# Patient Record
Sex: Male | Born: 1935
Health system: Southern US, Community
[De-identification: ages and names within clinical notes are randomized; demographics above are authoritative.]

## PROBLEM LIST (undated history)

## (undated) DIAGNOSIS — C801 Malignant (primary) neoplasm, unspecified: Secondary | ICD-10-CM

## (undated) DIAGNOSIS — I10 Essential (primary) hypertension: Secondary | ICD-10-CM

## (undated) DIAGNOSIS — G4730 Sleep apnea, unspecified: Secondary | ICD-10-CM

## (undated) DIAGNOSIS — N400 Enlarged prostate without lower urinary tract symptoms: Secondary | ICD-10-CM

## (undated) HISTORY — DX: Malignant (primary) neoplasm, unspecified: C80.1

---

## 2012-03-07 ENCOUNTER — Emergency Department (HOSPITAL_COMMUNITY)
Admission: EM | Admit: 2012-03-07 | Discharge: 2012-03-07 | Disposition: A | Discharge status: Home / Self Care | Payer: Medicare Other | Attending: Emergency Medicine | Admitting: Emergency Medicine

## 2012-03-07 ENCOUNTER — Encounter (HOSPITAL_COMMUNITY): Payer: Self-pay | Admitting: Emergency Medicine

## 2012-03-07 ENCOUNTER — Emergency Department (HOSPITAL_COMMUNITY): Payer: Medicare Other

## 2012-03-07 DIAGNOSIS — I10 Essential (primary) hypertension: Secondary | ICD-10-CM | POA: Insufficient documentation

## 2012-03-07 DIAGNOSIS — L2989 Other pruritus: Secondary | ICD-10-CM | POA: Insufficient documentation

## 2012-03-07 DIAGNOSIS — R05 Cough: Secondary | ICD-10-CM | POA: Insufficient documentation

## 2012-03-07 DIAGNOSIS — B029 Zoster without complications: Secondary | ICD-10-CM | POA: Insufficient documentation

## 2012-03-07 DIAGNOSIS — J40 Bronchitis, not specified as acute or chronic: Secondary | ICD-10-CM | POA: Insufficient documentation

## 2012-03-07 DIAGNOSIS — J3489 Other specified disorders of nose and nasal sinuses: Secondary | ICD-10-CM | POA: Insufficient documentation

## 2012-03-07 DIAGNOSIS — R059 Cough, unspecified: Secondary | ICD-10-CM | POA: Insufficient documentation

## 2012-03-07 DIAGNOSIS — L298 Other pruritus: Secondary | ICD-10-CM | POA: Insufficient documentation

## 2012-03-07 DIAGNOSIS — R21 Rash and other nonspecific skin eruption: Secondary | ICD-10-CM | POA: Insufficient documentation

## 2012-03-07 DIAGNOSIS — E119 Type 2 diabetes mellitus without complications: Secondary | ICD-10-CM | POA: Insufficient documentation

## 2012-03-07 HISTORY — DX: Essential (primary) hypertension: I10

## 2012-03-07 LAB — GLUCOSE, CAPILLARY: Glucose-Capillary: 84 mg/dL (ref 70–99)

## 2012-03-07 MED ORDER — AZITHROMYCIN 250 MG PO TABS: 250.0000 mg | ORAL_TABLET | Freq: Every day | ORAL | Status: AC

## 2012-03-07 MED ORDER — HYDROCODONE-ACETAMINOPHEN 5-325 MG PO TABS: 1.0000 | ORAL_TABLET | Freq: Four times a day (QID) | ORAL | Status: AC | PRN

## 2012-03-07 MED ORDER — ACYCLOVIR 400 MG PO TABS: 800.0000 mg | ORAL_TABLET | Freq: Every day | ORAL | Status: AC

## 2012-03-07 NOTE — ED Notes (Signed)
States rash come back after 35 years may be shingles on rt side he states

## 2012-03-07 NOTE — ED Provider Notes (Signed)
History     CSN: [id number]  Arrival date & time 03/07/12  0920   First MD Initiated Contact with Patient 03/07/12 [phone]      Chief Complaint  Patient presents with  . Rash    (Consider location/radiation/quality/duration/timing/severity/associated sxs/prior treatment) HPI Comments: Patient reports he has a rash that began 1-2 days ago that looks like his prior shingles he had over 30 years ago.  States he does not hurt yet but itches a little bit.  States he has also had some sinus congestion, rhinorrhea, and cough for the past 3-4 weeks.  Denies fevers, facial pain, SOB.  PCP is the [place].    Patient is a 76 y.o. male presenting with rash. The history is provided by the patient.  Rash     Past Medical History  Diagnosis Date  . Hypertension   . Diabetes mellitus     History reviewed. No pertinent past surgical history.  No family history on file.  History  Substance Use Topics  . Smoking status: Never Smoker   . Smokeless tobacco: Not on file  . Alcohol Use: No      Review of Systems  Constitutional: Negative for fever.  HENT: Positive for rhinorrhea. Negative for sore throat, trouble swallowing and sinus pressure.   Respiratory: Positive for cough. Negative for shortness of breath.   Cardiovascular: Negative for chest pain.  Skin: Positive for rash.    Allergies  Aspirin  Home Medications  No current outpatient prescriptions on file.  BP 131/81  Pulse 73  Temp(Src) 98.9 F (37.2 C) (Oral)  Resp 16  SpO2 99%  Physical Exam  Nursing note and vitals reviewed. Constitutional: He is oriented to person, place, and time. He appears well-developed and well-nourished.  HENT:  Head: Normocephalic and atraumatic.  Nose: Right sinus exhibits no maxillary sinus tenderness and no frontal sinus tenderness. Left sinus exhibits no maxillary sinus tenderness and no frontal sinus tenderness.  Mouth/Throat: Oropharynx is clear and moist. No oropharyngeal exudate.    Neck: Neck supple.  Cardiovascular: Normal rate and regular rhythm.   Pulmonary/Chest: Effort normal and breath sounds normal. No respiratory distress. He has no wheezes. He has no rales.  Musculoskeletal: Normal range of motion.  Neurological: He is alert and oriented to person, place, and time.  Skin: Rash noted. Rash is vesicular.     Psychiatric: He has a normal mood and affect. His behavior is normal. Judgment and thought content normal.    ED Course  Procedures (including critical care time)   Labs Reviewed  GLUCOSE, CAPILLARY   Dg Chest 2 View  03/07/2012  *RADIOLOGY REPORT*  Clinical Data: Cough.  Recent history of shingles.  CHEST - 2 VIEW  Comparison: None.  Findings: Linear subsegmental atelectasis or scarring noted at the right lung base.  Airway thickening may reflect bronchitis or reactive airways disease.  No airspace opacity is identified to suggest bacterial pneumonia pattern.   Cardiac and mediastinal contours appear unremarkable.  No pleural effusion noted.  Thoracic spondylosis is present.  IMPRESSION:  1.  Thoracic spondylosis. 2.  Minimal subsegmental atelectasis or scar at the right lung base. 3.  Mild central airway thickening may reflect bronchitis or reactive airways disease.  Original Report Authenticated By: Dellia Cloud, M.D.   11:35 AM Dr Clarene Duke is aware of the patient.   1. Herpes zoster   2. Bronchitis       MDM  Patient with rash c/w herpes zoster.  The rash began 1-2  days ago and is preceding the pain.  I have d/c home home with acyclovir and pain medication (for anticipated pain), also with z-pak for unresolving nasal congestion and cough - given duration of illness, have given antibiotic for likely bacterial component.  Patient verbalizes understanding and agrees with plan.        Dillard Cannon Vivian, [place] 03/07/12 1557

## 2012-03-07 NOTE — Discharge Instructions (Signed)
Shingles Shingles is caused by the same virus that causes chickenpox (varicella zoster virus or VZV). Shingles often occurs many years or decades after having chickenpox. That is why it is more common in adults older than 50 years. The virus reactivates and breaks out as an infection in a nerve root. SYMPTOMS   The initial feeling (sensations) may be pain. This pain is usually described as:   Burning.   Stabbing.   Throbbing.   Tingling in the nerve root.   A red rash will follow in a couple days. The rash may occur in any area of the body and is usually on one side (unilateral) of the body in a band or belt-like pattern. The rash usually starts out as very small blisters (vesicles). They will dry up after 7 to 10 days. This is not usually a significant problem except for the pain it causes.   Long-lasting (chronic) pain is more likely in an elderly person. It can last months to years. This condition is called postherpetic neuralgia.  Shingles can be an extremely severe infection in someone with AIDS, a weakened immune system, or with forms of leukemia. It can also be severe if you are taking transplant medicines or other medicines that weaken the immune system. TREATMENT  Your caregiver will often treat you with:  Antiviral drugs.   Anti-inflammatory drugs.   Pain medicines.  Bed rest is very important in preventing the pain associated with herpes zoster (postherpetic neuralgia). Application of heat in the form of a hot water bottle or electric heating pad or gentle pressure with the hand is recommended to help with the pain or discomfort. PREVENTION  A varicella zoster vaccine is available to help protect against the virus. The Food and Drug Administration approved the varicella zoster vaccine for individuals 62 years of age and older. HOME CARE INSTRUCTIONS   Cool compresses to the area of rash may be helpful.   Only take over-the-counter or prescription medicines for pain,  discomfort, or fever as directed by your caregiver.   Avoid contact with:   Babies.   Pregnant women.   Children with eczema.   Elderly people with transplants.   People with chronic illnesses, such as leukemia and AIDS.   If the area involved is on your face, you may receive a referral for follow-up to a specialist. It is very important to keep all follow-up appointments. This will help avoid eye complications, chronic pain, or disability.  SEEK IMMEDIATE MEDICAL CARE IF:   You develop any pain (headache) in the area of the face or eye. This must be followed carefully by your caregiver or ophthalmologist. An infection in part of your eye (cornea) can be very serious. It could lead to blindness.   You do not have pain relief from prescribed medicines.   Your redness or swelling spreads.   The area involved becomes very swollen and painful.   You have a fever.   You notice any red or painful lines extending away from the affected area toward your heart (lymphangitis).   Your condition is worsening or has changed.  Document Released: 12/06/2005 Document Revised: 11/25/2011 Document Reviewed: 11/10/2009 [facility] Patient Information 2012 [place], [place].  Bronchitis Bronchitis is the body's way of reacting to injury and/or infection (inflammation) of the bronchi. Bronchi are the air tubes that extend from the windpipe into the lungs. If the inflammation becomes severe, it may cause shortness of breath. CAUSES  Inflammation may be caused by:  A virus.  Germs (bacteria).   Dust.   Allergens.   Pollutants and many other irritants.  The cells lining the bronchial tree are covered with tiny hairs (cilia). These constantly beat upward, away from the lungs, toward the mouth. This keeps the lungs free of pollutants. When these cells become too irritated and are unable to do their job, mucus begins to develop. This causes the characteristic cough of bronchitis. The cough clears the  lungs when the cilia are unable to do their job. Without either of these protective mechanisms, the mucus would settle in the lungs. Then you would develop pneumonia. Smoking is a common cause of bronchitis and can contribute to pneumonia. Stopping this habit is the single most important thing you can do to help yourself. TREATMENT   Your caregiver may prescribe an antibiotic if the cough is caused by bacteria. Also, medicines that open up your airways make it easier to breathe. Your caregiver may also recommend or prescribe an expectorant. It will loosen the mucus to be coughed up. Only take over-the-counter or prescription medicines for pain, discomfort, or fever as directed by your caregiver.   Removing whatever causes the problem (smoking, for example) is critical to preventing the problem from getting worse.   Cough suppressants may be prescribed for relief of cough symptoms.   Inhaled medicines may be prescribed to help with symptoms now and to help prevent problems from returning.   For those with recurrent (chronic) bronchitis, there may be a need for steroid medicines.  SEEK IMMEDIATE MEDICAL CARE IF:   During treatment, you develop more pus-like mucus (purulent sputum).   You have a fever.   Your baby is older than 3 months with a rectal temperature of 102 F (38.9 C) or higher.   Your baby is 52 months old or younger with a rectal temperature of 100.4 F (38 C) or higher.   You become progressively more ill.   You have increased difficulty breathing, wheezing, or shortness of breath.  It is necessary to seek immediate medical care if you are elderly or sick from any other disease. MAKE SURE YOU:   Understand these instructions.   Will watch your condition.   Will get help right away if you are not doing well or get worse.  Document Released: 12/06/2005 Document Revised: 11/25/2011 Document Reviewed: 10/15/2008 [facility] Patient Information 2012 [place], [place].  If you  have no primary doctor, here are some resources that may be helpful:  Medicaid-accepting [facility] Providers:   - [facility]- [address]      [phone]      Mon-Fri 9am-7pm, Sat 9am-1pm   - [facility]- [address] [place], [place]      [phone]   - [facility]- [address], [address] [place]      [phone]   [facility]- [address]      [phone]   - Renaye Rakers- [address] [place], [address]      [phone]      Only accepts [place] patients       after they have her name applied to their card   Self Pay (no insurance) in [place]:   - Sickle Cell Patients: Dr Willey Blade, [facility]      [address] [place]      [phone]   - [facility] [phone]   - Physician Referral  Service- [phone]   - [facility]- [address] [place]      [phone]   Patrcia Dolly [facility]- [address]   - [facility]- see information above      (Speak to [employer] if you do not have insurance)   - [facility]- [address] [place]      [phone]   - [facility] [place]- [address]      [phone]   - [facility]- [address]      [phone]   - Dr Julio Sicks-  [address], [place]      [phone]   - [facility]- [address]      [phone]   - [facility]- [address]      [phone]      Also [address]      [phone]   - [facility]- [address]      [phone]      1st and 3rd Saturday every month, 10am-1pm Other agencies that provide inexpensive medical care:    [facility]  [phone]    [facility]  [phone]    [facility]  [phone]    [facility]  [phone]    [facility]  [phone]  General Information: Finding a doctor when you do not  have health insurance can be tricky. Although you are not limited by an insurance plan, you are of course limited by her finances and how much but he can pay out of pocket.  What are your options if you don't have health insurance?   1) Find a Librarian, academic and Pay Out of Pocket Although you won't have to find out who is covered by your insurance plan, it is a good idea to ask around and get recommendations. You will then need to call the office and see if the doctor you have chosen will accept you as a new patient and what types of options they offer for patients who are self-pay. Some doctors offer discounts or will set up payment plans for their patients who do not have insurance, but you will need to ask so you aren't surprised when you get to your appointment.  2) Contact Your Local Health Department Not all health departments have doctors that can see patients for sick visits, but many do, so it is worth a call to see if yours does. If you don't know where your local health department is, you can check in your phone book. The CDC also has a tool to help you locate your state's health department, and many state websites also have listings of all of their local health departments.  3) Find a Walk-in Clinic If your illness is not likely to be very severe or complicated, you may want to try a walk in clinic. These are popping up all over the country in pharmacies, drugstores, and shopping centers. They're usually staffed by nurse practitioners or physician assistants that have been trained to treat common illnesses and complaints. They're usually fairly quick and inexpensive. However, if you have serious medical issues or chronic medical problems, these are probably not your best option   RESOURCE GUIDE  Dental Problems  Patients with Medicaid: [facility]  [facility] [address]                                           [phone] [address] [employer] Phone:   [phone]                                                  Phone:  [phone]  If unable to pay or uninsured, contact:  [facility] or [facility]. to become qualified for the adult dental clinic.  Chronic Pain Problems Contact [facility]  [phone] Patients need to be referred by their primary care doctor.  Insufficient Money for Medicine Contact United Way:  call "211" or [facility] [phone].  No Primary Care Doctor Call [facility]  [phone] Other agencies that provide inexpensive medical care    [facility]  [phone]    [facility]  [phone]    [facility]  [phone]    [facility]  [phone]    [facility]  [phone]    [facility]  [phone]  Psychological Services [facility]  [phone] [facility] Services  [phone] [facility]   [phone] (emergency services [phone])  Substance Abuse Resources [facility]  [phone] [facility] [phone] [facility] [place] [phone] Floydene Flock [phone] Residential & Outpatient Substance Abuse Program  [phone]  Abuse/Neglect [facility] Child Abuse Hotline [phone] [facility] Child Abuse Hotline 419-845-1886 (After Hours)  Emergency Shelter [facility] [phone]  Maternity Homes [facility] at the [place] [facility] [place] [phone] [facility] [phone]  MRSA Hotline #:   279-333-8633    [facility] Resources  [facility] [place]     United Way                          [facility] Dept. [address] [place]                       [address]      [address]  Blondell Reveal Phone:  [phone]                                   Phone:  [phone]                 Phone:   [phone]  [facility] Mental Health Phone:  [phone]  [facility] Child Abuse Hotline [phone])[phone] [phone] (After Hours)

## 2012-03-07 NOTE — ED Notes (Signed)
Pt. Has blister like adhesions on his rt. Upper chest area under his rt. Nipple, which radiates around his rt. Upper back .   They are red, inflammed and a few have dark centers.  He reports that they are painful to touch.

## 2012-03-09 NOTE — ED Provider Notes (Signed)
Medical screening examination/treatment/procedure(s) were performed by non-physician practitioner and as supervising physician I was immediately available for consultation/collaboration.   Laray Anger, DO 03/09/12 1246

## 2012-06-30 ENCOUNTER — Emergency Department (HOSPITAL_COMMUNITY)
Admission: EM | Admit: 2012-06-30 | Discharge: 2012-06-30 | Disposition: A | Discharge status: Home / Self Care | Payer: Medicare Other | Attending: Emergency Medicine | Admitting: Emergency Medicine

## 2012-06-30 DIAGNOSIS — E119 Type 2 diabetes mellitus without complications: Secondary | ICD-10-CM | POA: Insufficient documentation

## 2012-06-30 DIAGNOSIS — M436 Torticollis: Secondary | ICD-10-CM | POA: Insufficient documentation

## 2012-06-30 DIAGNOSIS — I10 Essential (primary) hypertension: Secondary | ICD-10-CM | POA: Insufficient documentation

## 2012-06-30 DIAGNOSIS — L299 Pruritus, unspecified: Secondary | ICD-10-CM | POA: Insufficient documentation

## 2012-06-30 MED ORDER — DIPHENHYDRAMINE HCL 25 MG PO CAPS: 25.0000 mg | ORAL_CAPSULE | Freq: Four times a day (QID) | ORAL | Status: DC | PRN

## 2012-06-30 MED ORDER — TRAMADOL HCL 50 MG PO TABS: 50.0000 mg | ORAL_TABLET | Freq: Four times a day (QID) | ORAL | Status: AC | PRN

## 2012-06-30 MED ORDER — CYCLOBENZAPRINE HCL 10 MG PO TABS: 10.0000 mg | ORAL_TABLET | Freq: Two times a day (BID) | ORAL | Status: AC | PRN

## 2012-06-30 MED ORDER — DIPHENHYDRAMINE HCL 25 MG PO CAPS: 25.0000 mg | ORAL_CAPSULE | Freq: Four times a day (QID) | ORAL | Status: AC | PRN

## 2012-06-30 NOTE — ED Provider Notes (Signed)
History     CSN: [id number]  Arrival date & time 06/30/12  Anthony Sheppard   First MD Initiated Contact with Patient 06/30/12 1946      Chief Complaint  Patient presents with  . Torticollis  . URI  . Herpes Zoster  . Rash    (Consider location/radiation/quality/duration/timing/severity/associated sxs/prior treatment) HPI  76 year old male with history of herpes zoster, HTN, and diabtes presents complaining of neck discomfort. Patient reports for the past 3 days he has had pain and stiffness to his neck. States he woke up one morning and noticed pain to his neck. Describe pain as a sharp sensation that is constant and worsening with neck movement. Pain is nonradiating. He reports no fever, vision changes, nausea, vomiting, rash, numbness or weakness. He denies chest pain or shortness of breath. He denies any recent trauma. He has tried ibuprofen with some relief.  Patient also has a history of shingle rash to the right side of his chest for the past several months. States that the shingle rash has resolved however he continues experiencing itchiness. This is been ongoing for the past several weeks. He denies discomforts in ear, nose, mouth.  Past Medical History  Diagnosis Date  . Hypertension   . Diabetes mellitus     No past surgical history on file.  No family history on file.  History  Substance Use Topics  . Smoking status: Never Smoker   . Smokeless tobacco: Not on file  . Alcohol Use: No      Review of Systems  All other systems reviewed and are negative.    Allergies  Aspirin  Home Medications   Current Outpatient Rx  Name Route Sig Dispense Refill  . IBUPROFEN 200 MG PO TABS Oral Take 200 mg by mouth every 6 (six) hours as needed. Pain      BP 115/64  Pulse 77  Temp 98.6 F (37 C) (Oral)  Resp 18  SpO2 95%  Physical Exam  Nursing note and vitals reviewed. Constitutional: He is oriented to person, place, and time. He appears well-developed and  well-nourished. No distress.  HENT:  Head: Normocephalic and atraumatic.  Right Ear: External ear normal.  Left Ear: External ear normal.  Nose: Nose normal.  Mouth/Throat: Oropharynx is clear and moist. No oropharyngeal exudate.       No meningismal sign  Eyes: Conjunctivae and EOM are normal. Pupils are equal, round, and reactive to light.  Neck: Neck supple.       Tenderness to trapezius muscle to neck bilaterally on palpation without overlying skin changes, or swelling noted. No midline spine tenderness, no step-off. Sensation is intact throughout. Decreased range of motion to lateral rotation and extension. No tenderness with flexion  Negative Brudzinski signs, negative Kernig sign  Cardiovascular: Normal rate and regular rhythm.   Pulmonary/Chest: Effort normal and breath sounds normal. He has no wheezes. He exhibits no tenderness.       Hyperpigmented lesion following dermatomal pattern on the right side of chest below breast line.  No vesicles, or new rash.  Consistent with old shingle rash.     Abdominal: Soft. There is no tenderness.  Musculoskeletal: Normal range of motion. He exhibits no edema and no tenderness.  Lymphadenopathy:    He has no cervical adenopathy.  Neurological: He is alert and oriented to person, place, and time.       No focal neuro deficit  Skin: Skin is warm and dry.    ED Course  Procedures (including critical  care time)  Labs Reviewed - No data to display No results found.   No diagnosis found.  1. Neck stiffness 2. torticollis  MDM  Neck stiffness for 3 days.  Tenderness to trapezius muscle and to paracervical region.  No spine midline tenderness.  No meningismal sign, no focal neuro deficits. Will treat for torticollis with ice pack, muscle relaxant and pain medication.  Strict return precaution discussed. Benadryl for itch.  DIscussed with my attending.          Fayrene Helper, PA-C 06/30/12 2028

## 2012-06-30 NOTE — ED Notes (Addendum)
Has had shingles, but "all are healed" but c/o itching at site. Stiff neck started 2 days ago, cold sx.   States does not take medicine for diabetes now,

## 2012-06-30 NOTE — ED Notes (Signed)
Pt reports a stiff neck, recent cold symptoms and a rash which he reports is herpes zoster.

## 2012-07-01 NOTE — ED Provider Notes (Signed)
Medical screening examination/treatment/procedure(s) were conducted as a shared visit with non-physician practitioner(s) and myself.  I personally evaluated the patient during the encounter On my exam this elderly male was in no distress.  The patient was hesitant to move his neck laterally, but with no overt signs of infection.  Gerhard Munch, MD 07/01/12 2102

## 2012-12-14 ENCOUNTER — Emergency Department (HOSPITAL_COMMUNITY): Payer: Medicare Other

## 2012-12-14 ENCOUNTER — Emergency Department (HOSPITAL_COMMUNITY)
Admission: EM | Admit: 2012-12-14 | Discharge: 2012-12-14 | Disposition: A | Discharge status: Home / Self Care | Payer: Medicare Other | Attending: Emergency Medicine | Admitting: Emergency Medicine

## 2012-12-14 ENCOUNTER — Encounter (HOSPITAL_COMMUNITY): Payer: Self-pay | Admitting: *Deleted

## 2012-12-14 DIAGNOSIS — R0981 Nasal congestion: Secondary | ICD-10-CM

## 2012-12-14 DIAGNOSIS — I10 Essential (primary) hypertension: Secondary | ICD-10-CM | POA: Insufficient documentation

## 2012-12-14 DIAGNOSIS — J3489 Other specified disorders of nose and nasal sinuses: Secondary | ICD-10-CM | POA: Insufficient documentation

## 2012-12-14 DIAGNOSIS — R059 Cough, unspecified: Secondary | ICD-10-CM | POA: Insufficient documentation

## 2012-12-14 DIAGNOSIS — R05 Cough: Secondary | ICD-10-CM | POA: Insufficient documentation

## 2012-12-14 DIAGNOSIS — J069 Acute upper respiratory infection, unspecified: Secondary | ICD-10-CM | POA: Insufficient documentation

## 2012-12-14 DIAGNOSIS — E119 Type 2 diabetes mellitus without complications: Secondary | ICD-10-CM | POA: Insufficient documentation

## 2012-12-14 LAB — GLUCOSE, CAPILLARY

## 2012-12-14 MED ORDER — FLUTICASONE PROPIONATE 50 MCG/ACT NA SUSP: 2.0000 | Freq: Every day | NASAL | Status: DC

## 2012-12-14 MED ORDER — CETIRIZINE-PSEUDOEPHEDRINE ER 5-120 MG PO TB12: 1.0000 | ORAL_TABLET | Freq: Every day | ORAL | Status: DC

## 2012-12-14 MED ORDER — BENZONATATE 100 MG PO CAPS: 100.0000 mg | ORAL_CAPSULE | Freq: Three times a day (TID) | ORAL | Status: DC

## 2012-12-14 NOTE — ED Notes (Signed)
Pt c/o cold/cough/sinus congestion x 2 wks

## 2012-12-14 NOTE — ED Provider Notes (Signed)
History     CSN: [id number]  Arrival date & time 12/14/12  1842   First MD Initiated Contact with Patient 12/14/12 2130      No chief complaint on file.   (Consider location/radiation/quality/duration/timing/severity/associated sxs/prior treatment) HPI Anthony Sheppard is a 76 y.o. male who presents with complaint of cough and nasal congestion. States symptoms for 1-2 weeks, not improving. Taking some "over the counter meds" with some relief. States coughing up "yellow stuff." Denies fever, chills, neck pain or stiffness, sore throat, chest pain, shortness of breath.   Past Medical History  Diagnosis Date  . Hypertension   . Diabetes mellitus     History reviewed. No pertinent past surgical history.  No family history on file.  History  Substance Use Topics  . Smoking status: Never Smoker   . Smokeless tobacco: Not on file  . Alcohol Use: No      Review of Systems  Constitutional: Negative for fever and chills.  HENT: Positive for congestion. Negative for ear pain, sore throat, neck pain and neck stiffness.   Respiratory: Positive for cough. Negative for chest tightness, shortness of breath and wheezing.   Cardiovascular: Negative.   Gastrointestinal: Negative.   Genitourinary: Negative for dysuria, urgency, frequency and flank pain.  Musculoskeletal: Negative for myalgias and arthralgias.  Skin: Negative for rash.  Neurological: Negative for dizziness, weakness and headaches.    Allergies  Aspirin  Home Medications  No current outpatient prescriptions on file.  BP 123/71  Pulse 70  Temp 98.2 F (36.8 C)  Resp 20  SpO2 98%  Physical Exam  Nursing note and vitals reviewed. Constitutional: He is oriented to person, place, and time. He appears well-developed and well-nourished. No distress.  HENT:  Head: Normocephalic.  Right Ear: Tympanic membrane, external ear and ear canal normal.  Left Ear: Tympanic membrane, external ear and ear canal normal.    Nose: Rhinorrhea present. Right sinus exhibits no maxillary sinus tenderness and no frontal sinus tenderness. Left sinus exhibits no maxillary sinus tenderness and no frontal sinus tenderness.  Mouth/Throat: Mucous membranes are normal.  Eyes: Conjunctivae normal are normal.  Neck: Neck supple.  Cardiovascular: Normal rate, regular rhythm and normal heart sounds.   Pulmonary/Chest: Effort normal and breath sounds normal. No respiratory distress. He has no wheezes. He has no rales.  Musculoskeletal: He exhibits no edema.  Neurological: He is alert and oriented to person, place, and time.  Skin: Skin is warm and dry.    ED Course  Procedures (including critical care time)  Labs Reviewed - No data to display Dg Chest 2 View  12/14/2012  *RADIOLOGY REPORT*  Clinical Data: Cough  CHEST - 2 VIEW  Comparison: 03/07/2012  Findings: Degenerative changes in the mid thoracic spine. Lungs clear.  Heart size and pulmonary vascularity normal.  No effusion.  IMPRESSION: No acute disease   Original Report Authenticated By: D. Andria Rhein, MD      1. Cough   2. Nose congestion   3. Viral URI with cough       MDM  Pt with URI symptoms for two weeks. His exam is unremarkable other than mild nasal congestion. VS all within normal. Lungs are clear. CXR negative. He is afebrile. Non toxic. Discussed with Dr. Fonnie Jarvis who saw pt as well. Suspect a viral URI. Will treat symptomatically. I do not see any signs of bacterial infection and do not think any further testing or treatment with antibiotics is necessary  Lottie Mussel, PA 12/15/12 [phone]

## 2012-12-14 NOTE — ED Provider Notes (Signed)
Medical screening examination/treatment/procedure(s) were conducted as a shared visit with non-physician practitioner(s) and myself.  I personally evaluated the patient during the encounter.  Patient appears nontoxic with normal speech, lungs clear to auscultation.  Hurman Horn, MD 12/15/12 [phone]

## 2012-12-14 NOTE — ED Notes (Signed)
Pt ambulatory to exam room with steady gait. No acute distress.  

## 2012-12-15 NOTE — ED Provider Notes (Signed)
Medical screening examination/treatment/procedure(s) were conducted as a shared visit with non-physician practitioner(s) and myself.  I personally evaluated the patient during the encounter  Hurman Horn, MD 12/15/12 [phone]

## 2014-06-22 ENCOUNTER — Emergency Department (HOSPITAL_COMMUNITY)
Admission: EM | Admit: 2014-06-22 | Discharge: 2014-06-22 | Disposition: A | Discharge status: Home / Self Care | Payer: Medicare Other | Attending: Emergency Medicine | Admitting: Emergency Medicine

## 2014-06-22 ENCOUNTER — Emergency Department (HOSPITAL_COMMUNITY): Payer: Medicare Other

## 2014-06-22 ENCOUNTER — Encounter (HOSPITAL_COMMUNITY): Payer: Self-pay | Admitting: Emergency Medicine

## 2014-06-22 DIAGNOSIS — E119 Type 2 diabetes mellitus without complications: Secondary | ICD-10-CM | POA: Insufficient documentation

## 2014-06-22 DIAGNOSIS — M545 Low back pain, unspecified: Secondary | ICD-10-CM

## 2014-06-22 DIAGNOSIS — Z791 Long term (current) use of non-steroidal anti-inflammatories (NSAID): Secondary | ICD-10-CM | POA: Insufficient documentation

## 2014-06-22 DIAGNOSIS — Z79899 Other long term (current) drug therapy: Secondary | ICD-10-CM | POA: Insufficient documentation

## 2014-06-22 DIAGNOSIS — I10 Essential (primary) hypertension: Secondary | ICD-10-CM | POA: Insufficient documentation

## 2014-06-22 DIAGNOSIS — IMO0002 Reserved for concepts with insufficient information to code with codable children: Secondary | ICD-10-CM | POA: Insufficient documentation

## 2014-06-22 LAB — URINALYSIS, ROUTINE W REFLEX MICROSCOPIC
Bilirubin Urine: NEGATIVE
GLUCOSE, UA: NEGATIVE mg/dL
HGB URINE DIPSTICK: NEGATIVE
Ketones, ur: NEGATIVE mg/dL
Leukocytes, UA: NEGATIVE
Nitrite: NEGATIVE
PROTEIN: NEGATIVE mg/dL
Specific Gravity, Urine: 1.029 (ref 1.005–1.030)
Urobilinogen, UA: 1 mg/dL (ref 0.0–1.0)
pH: 6 (ref 5.0–8.0)

## 2014-06-22 LAB — CBG MONITORING, ED: GLUCOSE-CAPILLARY: 90 mg/dL (ref 70–99)

## 2014-06-22 MED ORDER — OXYCODONE-ACETAMINOPHEN 5-325 MG PO TABS
1.0000 | ORAL_TABLET | Freq: Once | ORAL | Status: DC
Start: 2014-06-22 — End: 2014-06-22
  Filled 2014-06-22: qty 1

## 2014-06-22 MED ORDER — NAPROXEN 250 MG PO TABS
500.0000 mg | ORAL_TABLET | Freq: Once | ORAL | Status: AC
  Administered 2014-06-22: 500 mg via ORAL
  Filled 2014-06-22: qty 2

## 2014-06-22 MED ORDER — NAPROXEN 500 MG PO TABS: 500.0000 mg | ORAL_TABLET | Freq: Two times a day (BID) | ORAL | Status: DC | PRN

## 2014-06-22 NOTE — ED Notes (Signed)
Patient presents with c/o right hip pain that has gotten worse over the past 3-4 weeks. States it is causing his back and left hip to hurt.

## 2014-06-22 NOTE — Discharge Instructions (Signed)
Back Pain:  Your back pain should be treated with medicines such as naproxen and this back pain should get better over the next 2 weeks.  However if you develop severe or worsening pain, low back pain with fever, numbness, weakness or inability to walk or urinate, you should return to the ER immediately.  Please follow up with your doctor this week for a recheck if still having symptoms.  Low back pain is discomfort in the lower back that may be due to injuries to muscles and ligaments around the spine.  Occasionally, it may be caused by a a problem to a part of the spine called a disc.  The pain may last several days or a week;  However, most patients get completely well in 4 weeks.  Self - care:  The application of heat can help soothe the pain.  Maintaining your daily activities, including walking, is encourged, as it will help you get better faster than just staying in bed. Perform gentle stretching as discussed. Drink plenty of fluids.  SEEK IMMEDIATE MEDICAL ATTENTION IF: New numbness, tingling, weakness, or problem with the use of your arms or legs.  Severe back pain not relieved with medications.  Difficulty with or loss of control of your bowel or bladder control.  Increasing pain in any areas of the body (such as chest or abdominal pain).  Shortness of breath, dizziness or fainting.  Nausea (feeling sick to your stomach), vomiting, fever, or sweats.  You will need to follow up with  The orthopedic provider in 1-2 weeks for reassessment.  Back Pain, Adult Back pain is very common. The pain often gets better over time. The cause of back pain is usually not dangerous. Most people can learn to manage their back pain on their own.  HOME CARE   Stay active. Start with short walks on flat ground if you can. Try to walk farther each day.  Do not sit, drive, or stand in one place for more than 30 minutes. Do not stay in bed.  Do not avoid exercise or work. Activity can help your back heal  faster.  Be careful when you bend or lift an object. Bend at your knees, keep the object close to you, and do not twist.  Sleep on a firm mattress. Lie on your side, and bend your knees. If you lie on your back, put a pillow under your knees.  Only take medicines as told by your doctor.  Put ice on the injured area.  Put ice in a plastic bag.  Place a towel between your skin and the bag.  Leave the ice on for 15-20 minutes, 03-04 times a day for the first 2 to 3 days. After that, you can switch between ice and heat packs.  Ask your doctor about back exercises or massage.  Avoid feeling anxious or stressed. Find good ways to deal with stress, such as exercise. GET HELP RIGHT AWAY IF:   Your pain does not go away with rest or medicine.  Your pain does not go away in 1 week.  You have new problems.  You do not feel well.  The pain spreads into your legs.  You cannot control when you poop (bowel movement) or pee (urinate).  Your arms or legs feel weak or lose feeling (numbness).  You feel sick to your stomach (nauseous) or throw up (vomit).  You have belly (abdominal) pain.  You feel like you may pass out (faint). MAKE SURE YOU:  Understand these instructions.  Will watch your condition.  Will get help right away if you are not doing well or get worse. Document Released: 05/24/2008 Document Revised: 02/28/2012 Document Reviewed: 04/26/2011 [facility] Patient Information 2015 [place], [place]. This information is not intended to replace advice given to you by your health care provider. Make sure you discuss any questions you have with your health care provider.

## 2014-06-22 NOTE — ED Provider Notes (Signed)
CSN: [id number]     Arrival date & time 06/22/14  2000 History  This chart was scribed for non-physician provider Zacarias Pontes, PA-C, working with Neta Ehlers, MD by Irene Pap, ED Scribe. This patient was seen in room [facility] and patient care was started at 9:06 PM.     Chief Complaint  Patient presents with  . Hip Pain  . Back Pain   Patient is a 78 y.o. male presenting with hip pain and back pain. The history is provided by the patient. No language interpreter was used.  Hip Pain This is a new problem. The current episode started more than 1 week ago. The problem occurs constantly. The problem has not changed since onset.Pertinent negatives include no chest pain, no abdominal pain, no headaches and no shortness of breath. The symptoms are aggravated by walking, standing and bending. He has tried NSAIDs for the symptoms. The treatment provided significant relief.  Back Pain Location:  Lumbar spine Quality:  Stabbing Associated symptoms: no abdominal pain, no chest pain, no dysuria, no fever, no headaches, no numbness and no weakness   Hip Pain This is a new problem. The current episode started more than 1 week ago. The problem occurs constantly. The problem has not changed since onset.Associated symptoms include arthralgias (b/l hips, low back). Pertinent negatives include no abdominal pain, change in bowel habit, chest pain, chills, fever, headaches, joint swelling, myalgias, nausea, neck pain, numbness, rash, urinary symptoms, visual change, vomiting or weakness. The symptoms are aggravated by walking, standing and bending. He has tried NSAIDs for the symptoms. The treatment provided significant relief.  HPI Comments: Anthony Sheppard is a 78 y.o. male with a history of DM and HTN who presents to the Emergency Department complaining of  Sharp back and hip pain onset 4 weeks ago. He reports that his lumbar region was hurting at first, then spread to hips last night. He  states that it hurts to get out of his car, walk, and to sleep. He reports that he took Aleve with some relief. He denies fevers/chills, dysuria, hematuria, abd pain, N/V/D, neck pain, CP/SOB, numbness, paresthesias, weakness, bowel/bladder incontinence, blurred vision, headaches, flank pain, penile discharge, hematochezia or melena.  He denies falling, any other injuries to the area and history of similar symptoms. He reports a history of issues with urination related to his prostate starting 1 year ago, for which he has been evaluated for. He reports that he is currently not taking medications for his DM.  Past Medical History  Diagnosis Date  . Hypertension   . Diabetes mellitus    History reviewed. No pertinent past surgical history. History reviewed. No pertinent family history. History  Substance Use Topics  . Smoking status: Never Smoker   . Smokeless tobacco: Never Used  . Alcohol Use: No    Review of Systems  Constitutional: Negative for fever and chills.  Eyes: Negative for pain, redness, itching and visual disturbance.  Respiratory: Negative for chest tightness and shortness of breath.   Cardiovascular: Negative for chest pain.  Gastrointestinal: Negative for nausea, vomiting, abdominal pain, diarrhea, blood in stool and change in bowel habit.  Genitourinary: Negative for dysuria, urgency, frequency, hematuria, flank pain, discharge, penile swelling, scrotal swelling, difficulty urinating, penile pain and testicular pain.  Musculoskeletal: Positive for arthralgias (b/l hips, low back) and back pain. Negative for joint swelling, myalgias, neck pain and neck stiffness.  Skin: Negative for rash.  Neurological: Negative for dizziness, syncope, weakness, numbness and headaches.  Allergies  Aspirin  Home Medications   Prior to Admission medications   Medication Sig Start Date End Date Taking? Authorizing Provider  benzonatate (TESSALON) 100 MG capsule Take 1 capsule (100 mg  total) by mouth every 8 (eight) hours. 12/14/12   Tatyana A Kirichenko, PA-C  cetirizine-pseudoephedrine (ZYRTEC-D) 5-120 MG per tablet Take 1 tablet by mouth daily. 12/14/12   Tatyana A Kirichenko, PA-C  fluticasone (FLONASE) 50 MCG/ACT nasal spray Place 2 sprays into the nose daily. 12/14/12   Tatyana A Kirichenko, PA-C  naproxen (NAPROSYN) 500 MG tablet Take 1 tablet (500 mg total) by mouth 2 (two) times daily as needed for moderate pain. 06/22/14   Cleo Villamizar Strupp Camprubi-Soms, PA-C   BP 102/56  Pulse 70  Temp(Src) 97.6 F (36.4 C) (Oral)  Resp 18  Ht 5\' 1"  (1.549 m)  Wt 140 lb (63.504 kg)  BMI 26.47 kg/m2  SpO2 98% Physical Exam  Nursing note and vitals reviewed. Constitutional: He is oriented to person, place, and time. Vital signs are normal. He appears well-developed and well-nourished. No distress.  HENT:  Head: Normocephalic and atraumatic.  Mouth/Throat: Mucous membranes are normal.  Eyes: Conjunctivae and EOM are normal.  Neck: Normal range of motion. Neck supple. No spinous process tenderness and no muscular tenderness present. Normal range of motion present.  Cspine nonTTP, FROM intact, no meningeal signs  Cardiovascular: Normal rate, intact distal pulses and normal pulses.   Pulmonary/Chest: Effort normal.  Abdominal: Soft. Normal appearance and bowel sounds are normal. He exhibits no distension. There is no tenderness. There is no rigidity, no rebound, no guarding and no CVA tenderness.  Musculoskeletal:       Right hip: He exhibits decreased range of motion (secondary to pain). He exhibits normal strength, no tenderness, no bony tenderness, no swelling, no crepitus and no deformity.       Left hip: He exhibits decreased range of motion (secondary to pain). He exhibits normal strength, no tenderness, no bony tenderness, no swelling, no crepitus and no deformity.       Cervical back: Normal.       Thoracic back: Normal.       Lumbar back: He exhibits decreased range of  motion and pain. He exhibits no tenderness, no bony tenderness, no swelling, no edema and no deformity.  C/T spine with FROM, non-TTP, no deformity or crepitus.  Lspine ROM decreased secondary to pain, , non-TTP along paraspinous muscles and spinous processes. No crepitus or bony deformity, no step offs. Minimal SI joint TTP.  B/L Hips non-TTP, ROM limited due to pain, no deformity or swelling, no erythema.  Strength 5/5 in all extremities. gait slightly antalgic  Neurological: He is alert and oriented to person, place, and time. He has normal strength and normal reflexes. No sensory deficit.  Negative SLR, strength 5/5 in all extremities, DTRs equal and symmetric, sensation grossly intact in all extremities.  Skin: Skin is warm, dry and intact. No rash noted. No erythema.  Psychiatric: He has a normal mood and affect. His behavior is normal.    ED Course  Procedures (including critical care time) DIAGNOSTIC STUDIES: Oxygen Saturation is 98% on room air, normal by my interpretation.    COORDINATION OF CARE: 9:16 PM-Discussed treatment plan which includes x-ray and labs with pt at bedside and pt agreed to plan.   Results for orders placed during the hospital encounter of 06/22/14  URINALYSIS, ROUTINE W REFLEX MICROSCOPIC      Result Value Ref Range  Color, Urine YELLOW  YELLOW   APPearance CLEAR  CLEAR   Specific Gravity, Urine 1.029  1.005 - 1.030   pH 6.0  5.0 - 8.0   Glucose, UA NEGATIVE  NEGATIVE mg/dL   Hgb urine dipstick NEGATIVE  NEGATIVE   Bilirubin Urine NEGATIVE  NEGATIVE   Ketones, ur NEGATIVE  NEGATIVE mg/dL   Protein, ur NEGATIVE  NEGATIVE mg/dL   Urobilinogen, UA 1.0  0.0 - 1.0 mg/dL   Nitrite NEGATIVE  NEGATIVE   Leukocytes, UA NEGATIVE  NEGATIVE  CBG MONITORING, ED      Result Value Ref Range   Glucose-Capillary 90  70 - 99 mg/dL   Comment 1 Notify RN     Dg Lumbar Spine Complete  06/22/2014   CLINICAL DATA:  Chronic low back pain.  EXAM: LUMBAR SPINE -  COMPLETE 4+ VIEW  COMPARISON:  None.  FINDINGS: Normal alignment. Transitional anatomy at the lumbosacral junction. Diffuse osteopenia. No fracture. SI joints are symmetric and unremarkable. Disc spaces are maintained. Mild degenerative facet disease in the lower lumbar spine.  IMPRESSION: Osteopenia. Degenerative facet disease in the lower lumbar spine. No acute findings.   Electronically Signed   By: Rolm Baptise M.D.   On: 06/22/2014 22:15   Dg Hip Bilateral W/pelvis  06/22/2014   CLINICAL DATA:  Chronic low back pain, bilateral hip pain, right greater than left.  EXAM: BILATERAL HIP WITH PELVIS - 4+ VIEW  COMPARISON:  None.  FINDINGS: Mild symmetric degenerative changes in the hips bilaterally. SI joints are symmetric and unremarkable. Mild osteopenia. No acute bony abnormality. Specifically, no fracture, subluxation, or dislocation. Soft tissues are intact.  IMPRESSION: No acute bony abnormality.   Electronically Signed   By: Rolm Baptise M.D.   On: 06/22/2014 22:16      MDM   Final diagnoses:  Bilateral low back pain without sciatica   Ali Mohl is a 78 y.o. male with a PMHx of HTN and DM (diet controlled), presenting with 1 month of back pain. No urinary symptoms. Gait antalgic with minimal SI joint TTP. Xrays showing degenerative changes. Doubt ankylosing spondylitis, doubt cauda equina syndrome. U/A neg. I believe this is arthritic in nature, doubt radiculopathy or disc pathology, doubt aortic aneurysm. This could be lumbar stenosis given degenerative facet disease of lumbar spine seen on xray. Since aleve helps and pt is elderly, will not give muscle relaxants or narcotics. Pt agrees with naproxen. No red flag s/s of low back pain. No s/s of central cord compression or cauda equina. Lower extremities are neurovascularly intact and patient is ambulating without assistance.Patient was counseled on back pain precautions and told to do activity as tolerated but do not lift, push, or pull  heavy objects more than 10 pounds for the next week. Patient counseled to use heat on back for no longer than 15 minutes every hour. Patient urged to follow-up with PCP if pain does not improve with treatment and rest or if pain becomes recurrent. Urged to return with worsening severe pain, loss of bowel or bladder control, trouble walking.   The patient verbalizes understanding and agrees with the plan. Stable at time of d/c.  I personally performed the services described in this documentation, which was scribed in my presence. The recorded information has been reviewed and is accurate.  BP 110/70  Pulse 64  Temp(Src) 97.7 F (36.5 C) (Oral)  Resp 18  Ht 5\' 1"  (1.549 m)  Wt 140 lb (63.504 kg)  BMI 26.47 kg/m2  SpO2 96%    Patty Sermons [place], [place] 06/23/14 [phone]

## 2014-06-22 NOTE — ED Notes (Signed)
Checked CBG 90

## 2014-06-23 NOTE — ED Provider Notes (Signed)
Medical screening examination/treatment/procedure(s) were performed by non-physician practitioner and as supervising physician I was immediately available for consultation/collaboration.   EKG Interpretation None        Neta Ehlers, MD 06/23/14 [phone]

## 2015-08-19 ENCOUNTER — Encounter (HOSPITAL_COMMUNITY): Payer: Self-pay | Admitting: Emergency Medicine

## 2015-08-19 ENCOUNTER — Emergency Department (HOSPITAL_COMMUNITY)
Admission: EM | Admit: 2015-08-19 | Discharge: 2015-08-19 | Disposition: A | Discharge status: Home / Self Care | Payer: Medicare Other | Attending: Emergency Medicine | Admitting: Emergency Medicine

## 2015-08-19 DIAGNOSIS — Z7951 Long term (current) use of inhaled steroids: Secondary | ICD-10-CM | POA: Diagnosis not present

## 2015-08-19 DIAGNOSIS — R34 Anuria and oliguria: Secondary | ICD-10-CM | POA: Insufficient documentation

## 2015-08-19 DIAGNOSIS — E119 Type 2 diabetes mellitus without complications: Secondary | ICD-10-CM | POA: Diagnosis not present

## 2015-08-19 DIAGNOSIS — R3919 Other difficulties with micturition: Secondary | ICD-10-CM | POA: Insufficient documentation

## 2015-08-19 DIAGNOSIS — R339 Retention of urine, unspecified: Secondary | ICD-10-CM | POA: Insufficient documentation

## 2015-08-19 DIAGNOSIS — R1030 Lower abdominal pain, unspecified: Secondary | ICD-10-CM | POA: Diagnosis not present

## 2015-08-19 DIAGNOSIS — Z87438 Personal history of other diseases of male genital organs: Secondary | ICD-10-CM | POA: Insufficient documentation

## 2015-08-19 DIAGNOSIS — I10 Essential (primary) hypertension: Secondary | ICD-10-CM | POA: Insufficient documentation

## 2015-08-19 DIAGNOSIS — Z79899 Other long term (current) drug therapy: Secondary | ICD-10-CM | POA: Insufficient documentation

## 2015-08-19 DIAGNOSIS — R109 Unspecified abdominal pain: Secondary | ICD-10-CM | POA: Diagnosis present

## 2015-08-19 HISTORY — DX: Benign prostatic hyperplasia without lower urinary tract symptoms: N40.0

## 2015-08-19 LAB — URINALYSIS, ROUTINE W REFLEX MICROSCOPIC
Bilirubin Urine: NEGATIVE
Glucose, UA: NEGATIVE mg/dL
KETONES UR: NEGATIVE mg/dL
LEUKOCYTES UA: NEGATIVE
Nitrite: NEGATIVE
PROTEIN: NEGATIVE mg/dL
Specific Gravity, Urine: 1.006 (ref 1.005–1.030)
UROBILINOGEN UA: 0.2 mg/dL (ref 0.0–1.0)
pH: 5 (ref 5.0–8.0)

## 2015-08-19 LAB — BASIC METABOLIC PANEL
Anion gap: 11 (ref 5–15)
BUN: 17 mg/dL (ref 6–20)
CALCIUM: 8.7 mg/dL — AB (ref 8.9–10.3)
CO2: 25 mmol/L (ref 22–32)
CREATININE: 0.61 mg/dL (ref 0.61–1.24)
Chloride: 94 mmol/L — ABNORMAL LOW (ref 101–111)
Glucose, Bld: 119 mg/dL — ABNORMAL HIGH (ref 65–99)
Potassium: 3.8 mmol/L (ref 3.5–5.1)
SODIUM: 130 mmol/L — AB (ref 135–145)

## 2015-08-19 LAB — CBC WITH DIFFERENTIAL/PLATELET
BASOS PCT: 0 % (ref 0–1)
Basophils Absolute: 0 10*3/uL (ref 0.0–0.1)
EOS ABS: 0 10*3/uL (ref 0.0–0.7)
Eosinophils Relative: 0 % (ref 0–5)
HCT: 36.7 % — ABNORMAL LOW (ref 39.0–52.0)
HEMOGLOBIN: 12 g/dL — AB (ref 13.0–17.0)
Lymphocytes Relative: 4 % — ABNORMAL LOW (ref 12–46)
Lymphs Abs: 0.4 10*3/uL — ABNORMAL LOW (ref 0.7–4.0)
MCH: 29.4 pg (ref 26.0–34.0)
MCHC: 32.7 g/dL (ref 30.0–36.0)
MCV: 90 fL (ref 78.0–100.0)
Monocytes Absolute: 0.7 10*3/uL (ref 0.1–1.0)
Monocytes Relative: 7 % (ref 3–12)
NEUTROS PCT: 89 % — AB (ref 43–77)
Neutro Abs: 9.2 10*3/uL — ABNORMAL HIGH (ref 1.7–7.7)
Platelets: 197 10*3/uL (ref 150–400)
RBC: 4.08 MIL/uL — AB (ref 4.22–5.81)
RDW: 11.7 % (ref 11.5–15.5)
WBC: 10.4 10*3/uL (ref 4.0–10.5)

## 2015-08-19 LAB — URINE MICROSCOPIC-ADD ON

## 2015-08-19 NOTE — Progress Notes (Signed)
Patient listed as having Medicare insurance without a pcp.  EDCM spoke to patient at bedside.  Patient reports his pcp is located at the [place] in [place] offered to provide patient a list of Medicare providers closer to his home and patient refused.  System updated.

## 2015-08-19 NOTE — ED Notes (Signed)
79 yo from home via [facility] with RLQ abdominal and groin pain since today. Pt went to [place] this morning and drank a large amount of water, had difficulty with urination and noticed drops of blood in urine. Difficulty sitting upright due to pain. No meds given in route. Pt ambulatory, A/O X4. Hx Enlarged Prostate and HTN.

## 2015-08-19 NOTE — ED Provider Notes (Signed)
CSN: [id number]     Arrival date & time 08/19/15  1707 History   First MD Initiated Contact with Patient 08/19/15 1731     Chief Complaint  Patient presents with  . Abdominal Pain  . Groin Pain     (Consider location/radiation/quality/duration/timing/severity/associated sxs/prior Treatment) HPI  79 year old male presents with being unable to urinate over the past several hours. The patient states that around 12 PM today he had a prostate biopsy to rule out cancer at the [facility]. The patient states he was able to urinate small amounts frequently since then for a couple hours but then over the last 3 hours he has had no urine output at all. Has tried to go multiple times without any relief. Now has lower abdominal pain and groin pain. Denies any penile pain, swelling, or testicular pain or swelling. No vomiting.  Past Medical History  Diagnosis Date  . Hypertension   . Diabetes mellitus   . Enlarged prostate    History reviewed. No pertinent past surgical history. No family history on file. Social History  Substance Use Topics  . Smoking status: Never Smoker   . Smokeless tobacco: Never Used  . Alcohol Use: No    Review of Systems  Gastrointestinal: Positive for abdominal pain. Negative for vomiting.  Genitourinary: Positive for decreased urine volume and difficulty urinating. Negative for penile swelling, scrotal swelling, penile pain and testicular pain.  All other systems reviewed and are negative.     Allergies  Aspirin  Home Medications   Prior to Admission medications   Medication Sig Start Date End Date Taking? Authorizing Provider  benzonatate (TESSALON) 100 MG capsule Take 1 capsule (100 mg total) by mouth every 8 (eight) hours. 12/14/12   Tatyana Kirichenko, PA-C  cetirizine-pseudoephedrine (ZYRTEC-D) 5-120 MG per tablet Take 1 tablet by mouth daily. 12/14/12   Tatyana Kirichenko, PA-C  fluticasone (FLONASE) 50 MCG/ACT nasal spray Place 2 sprays into the nose  daily. 12/14/12   Tatyana Kirichenko, PA-C  naproxen (NAPROSYN) 500 MG tablet Take 1 tablet (500 mg total) by mouth 2 (two) times daily as needed for moderate pain. 06/22/14   Mercedes Camprubi-Soms, PA-C   BP 123/71 mmHg  Pulse 73  Temp(Src) 98.3 F (36.8 C) (Oral)  Resp 16  SpO2 99% Physical Exam  Constitutional: He is oriented to person, place, and time. He appears well-developed and well-nourished.  HENT:  Head: Normocephalic and atraumatic.  Right Ear: External ear normal.  Left Ear: External ear normal.  Nose: Nose normal.  Eyes: Right eye exhibits no discharge. Left eye exhibits no discharge.  Neck: Neck supple.  Cardiovascular: Normal rate, regular rhythm, normal heart sounds and intact distal pulses.   Pulmonary/Chest: Effort normal and breath sounds normal.  Abdominal: Soft. There is tenderness in the suprapubic area. There is no rigidity.  Firm tenderness over lower abdomen that feels consistent with a distended bladder  Genitourinary: Testes normal and penis normal. Right testis shows no swelling and no tenderness. Left testis shows no swelling and no tenderness. Uncircumcised. No penile tenderness.  Musculoskeletal: He exhibits no edema.  Neurological: He is alert and oriented to person, place, and time.  Skin: Skin is warm and dry.  Nursing note and vitals reviewed.   ED Course  Procedures (including critical care time) Labs Review Labs Reviewed  BASIC METABOLIC PANEL - Abnormal; Notable for the following:    Sodium 130 (*)    Chloride 94 (*)    Glucose, Bld 119 (*)  Calcium 8.7 (*)    All other components within normal limits  CBC WITH DIFFERENTIAL/PLATELET - Abnormal; Notable for the following:    RBC 4.08 (*)    Hemoglobin 12.0 (*)    HCT 36.7 (*)    Neutrophils Relative % 89 (*)    Neutro Abs 9.2 (*)    Lymphocytes Relative 4 (*)    Lymphs Abs 0.4 (*)    All other components within normal limits  URINALYSIS, ROUTINE W REFLEX MICROSCOPIC (NOT AT [facility])  - Abnormal; Notable for the following:    Hgb urine dipstick SMALL (*)    All other components within normal limits  URINE MICROSCOPIC-ADD ON    Imaging Review No results found. I have personally reviewed and evaluated these images and lab results as part of my medical decision-making.   EKG Interpretation None      MDM   Final diagnoses:  Urinary retention    Patient's abdominal pain completely resolved with Foley catheter placement. Repeat abdominal exam shows soft abdomen with no distention or tenderness now. Over 700 mL of urine obtained after Foley catheter placement. Given his symptoms resolved and do not feel CT imaging is indicated. Likely his retention is from prostate hypertrophy which is a known issue for him. I have recommended he follow-up with his urologist in about one week and will leave the Foley catheter in until then.    Sherwood Gambler, MD 08/19/15 [phone]

## 2015-08-19 NOTE — Discharge Instructions (Signed)
Acute Urinary Retention Acute urinary retention is the temporary inability to urinate. This is a common problem in older men. As men age their prostates become larger and block the flow of urine from the bladder. This is usually a problem that has come on gradually.  HOME CARE INSTRUCTIONS If you are sent home with a Foley catheter and a drainage system, you will need to discuss the best course of action with your health care provider. While the catheter is in, maintain a good intake of fluids. Keep the drainage bag emptied and lower than your catheter. This is so that contaminated urine will not flow back into your bladder, which could lead to a urinary tract infection. There are two main types of drainage bags. One is a large bag that usually is used at night. It has a good capacity that will allow you to sleep through the night without having to empty it. The second type is called a leg bag. It has a smaller capacity, so it needs to be emptied more frequently. However, the main advantage is that it can be attached by a leg strap and can go underneath your clothing, allowing you the freedom to move about or leave your home. Only take over-the-counter or prescription medicines for pain, discomfort, or fever as directed by your health care provider.  SEEK MEDICAL CARE IF:  You develop a low-grade fever.  You experience spasms or leakage of urine with the spasms. SEEK IMMEDIATE MEDICAL CARE IF:   You develop chills or fever.  Your catheter stops draining urine.  Your catheter falls out.  You start to develop increased bleeding that does not respond to rest and increased fluid intake. MAKE SURE YOU:  Understand these instructions.  Will watch your condition.  Will get help right away if you are not doing well or get worse. Document Released: 03/14/2001 Document Revised: 12/11/2013 Document Reviewed: 05/17/2013 [facility] Patient Information 2015 [place], [place]. This information is not  intended to replace advice given to you by your health care provider. Make sure you discuss any questions you have with your health care provider.    Indwelling Urinary Catheter Care You have been given a flexible tube (catheter) used to drain the bladder. Catheters are often used when a person has difficulty urinating due to blockage, bleeding, infection, or inability to control bladder or bowel movements (incontinence). A catheter requires daily care to prevent infection and blockage. HOME CARE INSTRUCTIONS  Do the following to reduce the risk of infection. Antibiotic medicines cannot prevent infections. Limit the number of bacteria entering your bladder  Wash your hands for 2 minutes with soapy water before and after handling the catheter.  Wash your bottom and the entire catheter twice daily, as well as after each bowel movement. Wash the tip of the penis or just above the vaginal opening with soap and warm water, rinse, and then wash the rectal area. Always wash from front to back.  When changing from the leg bag to overnight bag or from the overnight bag to leg bag, thoroughly clean the end of the catheter where it connects to the tubing with an alcohol wipe.  Clean the leg bag and overnight bag daily after use. Replace your drainage bags weekly.  Always keep the tubing and bag below the level of your bladder. This allows your urine to drain properly. Lifting the bag or tubing above the level of your bladder will cause dirty urine to flow back into your bladder. If you must  briefly lift the bag higher than your bladder, pinch the catheter or tubing to prevent backflow.  Drink enough water and fluids to keep your urine clear or pale yellow, or as directed by your caregiver. This will flush bacteria out of the bladder. Protect tissues from injury  Attach the catheter to your leg so there is no tension on the catheter. Use adhesive tape or a leg strap. If you are using adhesive tape, remove  any sticky residue left behind by the previous tape you used.  Place your leg bag on your lower leg. Fasten the straps securely and comfortably.  Do not remove the catheter yourself unless you have been instructed how to do so. Keep the urinary pathway open  Check throughout the day to be sure your catheter is working and urine is draining freely. Make sure the tubing does not become kinked.  Do not let the drainage bag overfill. SEEK IMMEDIATE MEDICAL CARE IF:   The catheter becomes blocked. Urine is not draining.  Urine is leaking.  You have any pain.  You have a fever. Document Released: 12/06/2005 Document Revised: 11/22/2012 Document Reviewed: 05/07/2010 [facility] Patient Information 2015 [place], [place]. This information is not intended to replace advice given to you by your health care provider. Make sure you discuss any questions you have with your health care provider.

## 2015-08-19 NOTE — ED Notes (Signed)
Bed: WA03 Expected date:  Expected time:  Means of arrival:  Comments: EMS abd pain 

## 2016-01-11 ENCOUNTER — Emergency Department (HOSPITAL_COMMUNITY)
Admission: EM | Admit: 2016-01-11 | Discharge: 2016-01-11 | Disposition: A | Discharge status: Home / Self Care | Payer: Medicare Other | Attending: Emergency Medicine | Admitting: Emergency Medicine

## 2016-01-11 ENCOUNTER — Encounter (HOSPITAL_COMMUNITY): Payer: Self-pay | Admitting: Emergency Medicine

## 2016-01-11 DIAGNOSIS — Z79899 Other long term (current) drug therapy: Secondary | ICD-10-CM | POA: Insufficient documentation

## 2016-01-11 DIAGNOSIS — E119 Type 2 diabetes mellitus without complications: Secondary | ICD-10-CM | POA: Diagnosis not present

## 2016-01-11 DIAGNOSIS — R21 Rash and other nonspecific skin eruption: Secondary | ICD-10-CM | POA: Diagnosis present

## 2016-01-11 DIAGNOSIS — I10 Essential (primary) hypertension: Secondary | ICD-10-CM | POA: Diagnosis not present

## 2016-01-11 DIAGNOSIS — L308 Other specified dermatitis: Secondary | ICD-10-CM | POA: Diagnosis not present

## 2016-01-11 DIAGNOSIS — L853 Xerosis cutis: Secondary | ICD-10-CM | POA: Diagnosis not present

## 2016-01-11 DIAGNOSIS — N400 Enlarged prostate without lower urinary tract symptoms: Secondary | ICD-10-CM | POA: Diagnosis not present

## 2016-01-11 DIAGNOSIS — L299 Pruritus, unspecified: Secondary | ICD-10-CM

## 2016-01-11 MED ORDER — EUCERIN EX LOTN: TOPICAL_LOTION | CUTANEOUS | Status: DC | PRN

## 2016-01-11 MED ORDER — HYDROXYZINE HCL 25 MG PO TABS: 25.0000 mg | ORAL_TABLET | Freq: Four times a day (QID) | ORAL | Status: DC | PRN

## 2016-01-11 NOTE — ED Notes (Signed)
God daughter at bedside. She stated that pt is not a diabetic and is not on any medications for diabetes

## 2016-01-11 NOTE — ED Provider Notes (Signed)
CSN: [id number]     Arrival date & time 01/11/16  1230 History  By signing my name below, I, Hansel Feinstein, attest that this documentation has been prepared under the direction and in the presence of Paysen Goza Y Adelyna Brockman, [place]. Electronically Signed: Hansel Feinstein, ED Scribe. 01/11/2016. 1:34 PM.    Chief Complaint  Patient presents with  . Insect Bite  . Rash   The history is provided by the patient. No language interpreter was used.    HPI Comments: Anthony Sheppard is a 80 y.o. male with h/o HTN, DM who presents to the Emergency Department complaining of a moderate, gradually worsening, generalized pruritic rash for a few months. States it is the worst around his neck, bilateral arms, and waistband. Pt attributes the rash to bed bug bites. States he sees small black bugs on his pillow case and walls. Pt states he used rubbing alcohol on his arms last night and it got rid of the rash. No other new soaps, lotions, detergents, foods, animals, plants, medications. Pt lives alone. He denies fever, chills. Pt normally follows the [facility] for his healthcare and has PCP appointment 01/20/16.  On exam pt has diffusely dry skin with few excoriation marks but no other rash or lesions visualized. I spoke to his god daughter outside of the room who states pt has had similar complaint for the past several months. They have changed his bed mattress, fumigated his house, but pt continues to complain of bed bugs. Pt's god daughter states she has been to his house several times and never seen any bugs. Family has concern of possible early dementia.   Past Medical History  Diagnosis Date  . Hypertension   . Diabetes mellitus   . Enlarged prostate    History reviewed. No pertinent past surgical history. History reviewed. No pertinent family history. Social History  Substance Use Topics  . Smoking status: Never Smoker   . Smokeless tobacco: Never Used  . Alcohol Use: No    Review of Systems  Constitutional: Negative for  fever and chills.  Skin: Positive for rash.  All other systems reviewed and are negative.  Allergies  Aspirin  Home Medications   Prior to Admission medications   Medication Sig Start Date End Date Taking? Authorizing Provider  cetirizine-pseudoephedrine (ZYRTEC-D) 5-120 MG per tablet Take 1 tablet by mouth daily. Patient not taking: Reported on 08/19/2015 12/14/12   Tatyana Kirichenko, PA-C  fluticasone (FLONASE) 50 MCG/ACT nasal spray Place 2 sprays into the nose daily. Patient not taking: Reported on 08/19/2015 12/14/12   Tatyana Kirichenko, PA-C  naproxen (NAPROSYN) 500 MG tablet Take 1 tablet (500 mg total) by mouth 2 (two) times daily as needed for moderate pain. Patient not taking: Reported on 08/19/2015 06/22/14   Mercedes Camprubi-Soms, PA-C  tamsulosin (FLOMAX) 0.4 MG CAPS capsule Take 0.4 mg by mouth daily.    Historical Provider, MD   BP 140/78 mmHg  Pulse 66  Temp(Src) 97.6 F (36.4 C) (Oral)  Resp 18  SpO2 99% Physical Exam  Constitutional: He is oriented to person, place, and time. He appears well-developed and well-nourished.  HENT:  Head: Normocephalic and atraumatic.  Eyes: Conjunctivae and EOM are normal. Pupils are equal, round, and reactive to light.  Neck: Normal range of motion. Neck supple.  Cardiovascular: Normal rate.   Pulmonary/Chest: Effort normal. No respiratory distress.  Abdominal: He exhibits no distension.  Musculoskeletal: Normal range of motion.  Neurological: He is alert and oriented to person, place, and time.  Skin: Skin is warm and dry. No rash noted.  Skin is diffusely dry with few small excoriation marks to lower back. No papules, vesicles, pustules, or other lesions on skin from head to toe. No burrowing lines.   Psychiatric: He has a normal mood and affect. His behavior is normal.  Nursing note and vitals reviewed.   ED Course  Procedures (including critical care time) DIAGNOSTIC STUDIES: Oxygen Saturation is 99% on RA, normal by my  interpretation.    COORDINATION OF CARE: 1:31 PM Discussed treatment plan with pt at bedside and pt agreed to plan.    MDM   Final diagnoses:  Xerosis cutis  Pruritic dermatitis    I discussed with pt that I do not see any lesions concerning for rash or bug bite. However, his skin is dry and I suspect this is causing his itching. Pt does not believe me. He does not want prescriptions for antihistamine and moisturizing lotion. I told pt that he can keep the prescriptions and fill them if he changes his mind. He is in agreement. He insists that there are bugs at home. He is otherwise A&O and acting appropriately. He and his god daughter deny EtOH or other drug use. His only other home med is Flomax. I encouraged PCP f/u as scheduled for re-evaluation of pruritis. Discussed with god daughter that she should accompany pt to his PCP appt later this month and discuss her concerns as well. ER return precautions given.   I personally performed the services described in this documentation, which was scribed in my presence. The recorded information has been reviewed and is accurate.   Anne Ng, PA-C 01/11/16 1411  Davonna Belling, MD 01/11/16 [phone]

## 2016-01-11 NOTE — ED Notes (Signed)
For the past few months pt has had bug bites to entire body, worse at night time. Gradually getting worse. Using rubbing alcohol to help some. Lives alone. Area worse on hit waist band. No animals in the home.

## 2016-01-11 NOTE — Discharge Instructions (Signed)
You were seen in the emergency room today for itching skin. We did not see any bumps or rashes on your skin. Your skin is very dry which I suspect is causing the itching. I have given you prescriptions for moisturizing lotion and an oral medication to help with itching. Avoid things that dry your skin out such as rubbing alcohol. Please follow up with your primary care provider at the [facility] as scheduled. Return to the ER for new or worsening symptoms.

## 2016-03-25 ENCOUNTER — Encounter (HOSPITAL_COMMUNITY): Payer: Self-pay | Admitting: *Deleted

## 2016-03-25 ENCOUNTER — Emergency Department (HOSPITAL_COMMUNITY)
Admission: EM | Admit: 2016-03-25 | Discharge: 2016-03-25 | Disposition: A | Discharge status: Home / Self Care | Payer: Medicare Other | Attending: Emergency Medicine | Admitting: Emergency Medicine

## 2016-03-25 ENCOUNTER — Emergency Department (HOSPITAL_COMMUNITY): Payer: Medicare Other

## 2016-03-25 DIAGNOSIS — I10 Essential (primary) hypertension: Secondary | ICD-10-CM | POA: Insufficient documentation

## 2016-03-25 DIAGNOSIS — Z87438 Personal history of other diseases of male genital organs: Secondary | ICD-10-CM | POA: Diagnosis not present

## 2016-03-25 DIAGNOSIS — R300 Dysuria: Secondary | ICD-10-CM | POA: Insufficient documentation

## 2016-03-25 DIAGNOSIS — R350 Frequency of micturition: Secondary | ICD-10-CM | POA: Diagnosis not present

## 2016-03-25 DIAGNOSIS — Z79899 Other long term (current) drug therapy: Secondary | ICD-10-CM | POA: Insufficient documentation

## 2016-03-25 DIAGNOSIS — E119 Type 2 diabetes mellitus without complications: Secondary | ICD-10-CM | POA: Insufficient documentation

## 2016-03-25 DIAGNOSIS — R059 Cough, unspecified: Secondary | ICD-10-CM

## 2016-03-25 DIAGNOSIS — R05 Cough: Secondary | ICD-10-CM | POA: Diagnosis not present

## 2016-03-25 LAB — URINALYSIS, ROUTINE W REFLEX MICROSCOPIC
Bilirubin Urine: NEGATIVE
Glucose, UA: NEGATIVE mg/dL
Hgb urine dipstick: NEGATIVE
Ketones, ur: NEGATIVE mg/dL
LEUKOCYTES UA: NEGATIVE
NITRITE: NEGATIVE
PH: 6 (ref 5.0–8.0)
Protein, ur: NEGATIVE mg/dL
SPECIFIC GRAVITY, URINE: 1.021 (ref 1.005–1.030)

## 2016-03-25 MED ORDER — GUAIFENESIN 100 MG/5ML PO SYRP: 100.0000 mg | ORAL_SOLUTION | ORAL | Status: DC | PRN

## 2016-03-25 NOTE — ED Provider Notes (Signed)
CSN: [id number]     Arrival date & time 03/25/16  1908 History  By signing my name below, I, Soijett Blue, attest that this documentation has been prepared under the direction and in the presence of Junius Creamer, NP Electronically Signed: Soijett Blue, ED Scribe. 03/25/2016. 9:06 PM.   Chief Complaint  Patient presents with  . Urinary Tract Infection  . URI      The history is provided by the patient. No language interpreter was used.    HPI Comments: Anthony Sheppard is a 80 y.o. male with a PMHx of enlarged prostate, HTN, DM, who presents to the Emergency Department complaining of cough onset 2 weeks. Pt notes that he thinks that he may have the flu. Pt denies seeing his PCP for evaluation of his symptoms. Pt has not tried any medications for his symptoms. Pt denies fever, appetite change, neck pain, and any other symptoms.  Pt secondarily complains of dysuria x 2 days which he has not seen his PCP for either. He states that he is having associated symptoms of urinary frequency. Pt denies urinary retention, constipation, and any other symptoms. He states that he has not tried any medications for the relief for his symptoms.  Pt PCP is at [place].   Past Medical History  Diagnosis Date  . Hypertension   . Diabetes mellitus   . Enlarged prostate    History reviewed. No pertinent past surgical history. No family history on file. Social History  Substance Use Topics  . Smoking status: Never Smoker   . Smokeless tobacco: Never Used  . Alcohol Use: No    Review of Systems  Constitutional: Negative for fever and appetite change.  Respiratory: Positive for cough.   Gastrointestinal: Negative for abdominal pain, constipation and abdominal distention.  Genitourinary: Positive for dysuria and frequency. Negative for difficulty urinating.  Musculoskeletal: Negative for neck pain.  All other systems reviewed and are negative.     Allergies  Aspirin  Home Medications   Prior to  Admission medications   Medication Sig Start Date End Date Taking? Authorizing Provider  cetirizine-pseudoephedrine (ZYRTEC-D) 5-120 MG per tablet Take 1 tablet by mouth daily. Patient not taking: Reported on 08/19/2015 12/14/12   Tatyana Kirichenko, PA-C  Emollient (EUCERIN) lotion Apply topically as needed for dry skin. 01/11/16   Olivia Canter Sam, PA-C  fluticasone (FLONASE) 50 MCG/ACT nasal spray Place 2 sprays into the nose daily. Patient not taking: Reported on 08/19/2015 12/14/12   Tatyana Kirichenko, PA-C  guaifenesin (ROBITUSSIN) 100 MG/5ML syrup Take 5-10 mLs (100-200 mg total) by mouth every 4 (four) hours as needed for cough. 03/25/16   Junius Creamer, NP  hydrOXYzine (ATARAX/VISTARIL) 25 MG tablet Take 1 tablet (25 mg total) by mouth every 6 (six) hours as needed for itching. 01/11/16   Olivia Canter Sam, PA-C  naproxen (NAPROSYN) 500 MG tablet Take 1 tablet (500 mg total) by mouth 2 (two) times daily as needed for moderate pain. Patient not taking: Reported on 08/19/2015 06/22/14   Mercedes Camprubi-Soms, PA-C  tamsulosin (FLOMAX) 0.4 MG CAPS capsule Take 0.4 mg by mouth daily.    Historical Provider, MD   BP 108/65 mmHg  Pulse 68  Temp(Src) 98.3 F (36.8 C) (Oral)  Resp 18  SpO2 95% Physical Exam  Constitutional: He is oriented to person, place, and time. He appears well-developed and well-nourished. No distress.  HENT:  Head: Normocephalic and atraumatic.  Mouth/Throat: Uvula is midline, oropharynx is clear and moist and mucous membranes are  normal.  Eyes: EOM are normal.  Neck: Neck supple.  Cardiovascular: Normal rate, regular rhythm and normal heart sounds.  Exam reveals no gallop and no friction rub.   No murmur heard. Pulmonary/Chest: Effort normal and breath sounds normal. No respiratory distress. He has no wheezes. He has no rales.  Abdominal: Soft. There is no tenderness.  No suprapubic tenderness  Musculoskeletal: Normal range of motion.  Neurological: He is alert and oriented to  person, place, and time.  Skin: Skin is warm and dry.  Psychiatric: He has a normal mood and affect. His behavior is normal.  Nursing note and vitals reviewed.   ED Course  Procedures (including critical care time) DIAGNOSTIC STUDIES: Oxygen Saturation is 95% on RA, adequate by my interpretation.    COORDINATION OF CARE: 9:06 PM Discussed treatment plan with pt at bedside which includes UA, CXR and pt agreed to plan.    Labs Review Labs Reviewed  URINALYSIS, ROUTINE W REFLEX MICROSCOPIC (NOT AT [facility])    Imaging Review Dg Chest 2 View  03/25/2016  CLINICAL DATA:  Productive cough for 2 weeks. Shortness of breath on exertion. EXAM: CHEST  2 VIEW COMPARISON:  12/14/2012 FINDINGS: Heart is normal in size, minimal tortuosity of the thoracic aorta. The lungs are clear. Pulmonary vasculature is normal. No consolidation, pleural effusion, or pneumothorax. No acute osseous abnormalities are seen. Degenerative disc disease in the midthoracic spine. IMPRESSION: No acute pulmonary process. Electronically Signed   By: Jeb Levering M.D.   On: 03/25/2016 21:18   I have personally reviewed and evaluated these images and lab results as part of my medical decision-making.   EKG Interpretation None     X ray and urine both normal Will Rx Robitussin and recommend FU with his PCP at [place]   Final diagnoses:  Cough  Dysuria   I personally performed the services described in this documentation, which was scribed in my presence. The recorded information has been reviewed and is accurate.   Junius Creamer, NP 03/25/16 2145  Junius Creamer, NP 03/25/16 2148  Gareth Morgan, MD 03/29/16 [phone]

## 2016-03-25 NOTE — ED Notes (Signed)
Pt ambulated to XRay

## 2016-03-25 NOTE — ED Notes (Signed)
Pt c/o cough and congestion x 2 weeks; pt reports yellow drainage from nose; pt reports productive cough at times with yellow sputum; pt also c/o urinary frequency and urgency

## 2016-03-25 NOTE — Discharge Instructions (Signed)
Your chest xray is normal  You have been prescribed a cough medication You urine test is normal - no sign of infection Please make an appointment with your PCP at the [facility] for follow up care   Cough, Adult Coughing is a reflex that clears your throat and your airways. Coughing helps to heal and protect your lungs. It is normal to cough occasionally, but a cough that happens with other symptoms or lasts a long time may be a sign of a condition that needs treatment. A cough may last only 2-3 weeks (acute), or it may last longer than 8 weeks (chronic). CAUSES Coughing is commonly caused by:  Breathing in substances that irritate your lungs.  A viral or bacterial respiratory infection.  Allergies.  Asthma.  Postnasal drip.  Smoking.  Acid backing up from the stomach into the esophagus (gastroesophageal reflux).  Certain medicines.  Chronic lung problems, including COPD (or rarely, lung cancer).  Other medical conditions such as heart failure. HOME CARE INSTRUCTIONS  Pay attention to any changes in your symptoms. Take these actions to help with your discomfort:  Take medicines only as told by your health care provider.  If you were prescribed an antibiotic medicine, take it as told by your health care provider. Do not stop taking the antibiotic even if you start to feel better.  Talk with your health care provider before you take a cough suppressant medicine.  Drink enough fluid to keep your urine clear or pale yellow.  If the air is dry, use a cold steam vaporizer or humidifier in your bedroom or your home to help loosen secretions.  Avoid anything that causes you to cough at work or at home.  If your cough is worse at night, try sleeping in a semi-upright position.  Avoid cigarette smoke. If you smoke, quit smoking. If you need help quitting, ask your health care provider.  Avoid caffeine.  Avoid alcohol.  Rest as needed. SEEK MEDICAL CARE IF:   You have new  symptoms.  You cough up pus.  Your cough does not get better after 2-3 weeks, or your cough gets worse.  You cannot control your cough with suppressant medicines and you are losing sleep.  You develop pain that is getting worse or pain that is not controlled with pain medicines.  You have a fever.  You have unexplained weight loss.  You have night sweats. SEEK IMMEDIATE MEDICAL CARE IF:  You cough up blood.  You have difficulty breathing.  Your heartbeat is very fast.   This information is not intended to replace advice given to you by your health care provider. Make sure you discuss any questions you have with your health care provider.   Document Released: 06/04/2011 Document Revised: 08/27/2015 Document Reviewed: 02/12/2015 Elsevier Interactive Patient Education [employer].

## 2016-07-18 ENCOUNTER — Encounter (HOSPITAL_COMMUNITY): Payer: Self-pay | Admitting: Emergency Medicine

## 2016-07-18 ENCOUNTER — Emergency Department (HOSPITAL_COMMUNITY)
Admission: EM | Admit: 2016-07-18 | Discharge: 2016-07-18 | Disposition: A | Discharge status: Home / Self Care | Payer: Medicare Other | Attending: Emergency Medicine | Admitting: Emergency Medicine

## 2016-07-18 DIAGNOSIS — Z09 Encounter for follow-up examination after completed treatment for conditions other than malignant neoplasm: Secondary | ICD-10-CM | POA: Diagnosis not present

## 2016-07-18 DIAGNOSIS — I10 Essential (primary) hypertension: Secondary | ICD-10-CM | POA: Diagnosis not present

## 2016-07-18 DIAGNOSIS — Z79899 Other long term (current) drug therapy: Secondary | ICD-10-CM | POA: Diagnosis not present

## 2016-07-18 DIAGNOSIS — E119 Type 2 diabetes mellitus without complications: Secondary | ICD-10-CM | POA: Diagnosis not present

## 2016-07-18 DIAGNOSIS — Z0000 Encounter for general adult medical examination without abnormal findings: Secondary | ICD-10-CM

## 2016-07-18 LAB — COMPREHENSIVE METABOLIC PANEL
ALK PHOS: 67 U/L (ref 38–126)
ALT: 12 U/L — AB (ref 17–63)
ANION GAP: 6 (ref 5–15)
AST: 16 U/L (ref 15–41)
Albumin: 3.8 g/dL (ref 3.5–5.0)
BILIRUBIN TOTAL: 0.4 mg/dL (ref 0.3–1.2)
BUN: 23 mg/dL — ABNORMAL HIGH (ref 6–20)
CALCIUM: 8.7 mg/dL — AB (ref 8.9–10.3)
CO2: 26 mmol/L (ref 22–32)
CREATININE: 0.66 mg/dL (ref 0.61–1.24)
Chloride: 108 mmol/L (ref 101–111)
Glucose, Bld: 95 mg/dL (ref 65–99)
Potassium: 4.2 mmol/L (ref 3.5–5.1)
Sodium: 140 mmol/L (ref 135–145)
TOTAL PROTEIN: 7.1 g/dL (ref 6.5–8.1)

## 2016-07-18 LAB — CBC WITH DIFFERENTIAL/PLATELET
BASOS ABS: 0 10*3/uL (ref 0.0–0.1)
BASOS PCT: 0 %
EOS ABS: 0.2 10*3/uL (ref 0.0–0.7)
Eosinophils Relative: 4 %
HEMATOCRIT: 38.1 % — AB (ref 39.0–52.0)
HEMOGLOBIN: 12.5 g/dL — AB (ref 13.0–17.0)
Lymphocytes Relative: 27 %
Lymphs Abs: 1.6 10*3/uL (ref 0.7–4.0)
MCH: 30 pg (ref 26.0–34.0)
MCHC: 32.8 g/dL (ref 30.0–36.0)
MCV: 91.4 fL (ref 78.0–100.0)
Monocytes Absolute: 0.4 10*3/uL (ref 0.1–1.0)
Monocytes Relative: 7 %
NEUTROS ABS: 3.7 10*3/uL (ref 1.7–7.7)
NEUTROS PCT: 62 %
Platelets: 177 10*3/uL (ref 150–400)
RBC: 4.17 MIL/uL — ABNORMAL LOW (ref 4.22–5.81)
RDW: 12.3 % (ref 11.5–15.5)
WBC: 6 10*3/uL (ref 4.0–10.5)

## 2016-07-18 LAB — URINALYSIS, ROUTINE W REFLEX MICROSCOPIC
Bilirubin Urine: NEGATIVE
GLUCOSE, UA: NEGATIVE mg/dL
HGB URINE DIPSTICK: NEGATIVE
Ketones, ur: NEGATIVE mg/dL
Leukocytes, UA: NEGATIVE
Nitrite: NEGATIVE
PROTEIN: NEGATIVE mg/dL
Specific Gravity, Urine: 1.022 (ref 1.005–1.030)
pH: 5.5 (ref 5.0–8.0)

## 2016-07-18 LAB — CBG MONITORING, ED: Glucose-Capillary: 78 mg/dL (ref 65–99)

## 2016-07-18 NOTE — ED Notes (Signed)
PA in room delay in blood draw

## 2016-07-18 NOTE — ED Triage Notes (Signed)
Patient states that he normally sees the [place] but "everyone says I need to get checked out".  Patient states that over 2 week ago he was stumbling around and didn't fall because he was able to catch himself.  Patient states "besides cough and runny nose" nothing else is wrong with me.  Male visitor with patient requesting a "check up: BP, HR, ears, blood sugar, cholesterol...etc. A good work up".  Patient states that he is a diabetic but not taking any medications nor checking his sugars.

## 2016-07-18 NOTE — ED Provider Notes (Signed)
[facility] DEPT Provider Note   CSN: [id number] Arrival date & time: 07/18/16  1231  First Provider Contact:  None       History   Chief Complaint Chief Complaint  Patient presents with  . Follow-up    HPI Anthony Sheppard is a 80 y.o. male who is brought in by his goddaughter 4 episodes of gait imbalance. History is given by the patient. His goddaughter. He is followed at the [facility]. He has a history of diabetes but takes no medications for. His last visit at the [place] was 4 months ago. He takes only medication for his prostate enlargement. The patient states that he is not sure why he needed to come today because he does not feel he is having any issues. His daughter states that she noticed several episodes where he seemed to be a little bit off balance. He has no other neurologic symptoms such as headaches, facial droop, unilateral weakness, difficulty with speech or swallowing. Patient states that he thinks he thinks he was walking on an uneven surface when he lost his balance, however, he does not feel that he is having any difficulty walking. He states that he ate most of the time. He walks very well. He does not use any ambulatory aides. He does not appear to have any foot drop. He denies back pain. He has not had any other symptoms such as altered mental status, fevers, chills, abdominal pain, chest pain, a urinary symptoms.  HPI  Past Medical History:  Diagnosis Date  . Diabetes mellitus   . Enlarged prostate   . Hypertension     There are no active problems to display for this patient.   History reviewed. No pertinent surgical history.     Home Medications    Prior to Admission medications   Medication Sig Start Date End Date Taking? Authorizing Provider  Chlorpheniramine Maleate (ALLERGY PO) Take 1 tablet by mouth daily as needed (allergies).   Yes Historical Provider, MD  finasteride (PROSCAR) 5 MG tablet Take 5 mg by mouth at bedtime.   Yes Historical  Provider, MD  tamsulosin (FLOMAX) 0.4 MG CAPS capsule Take 0.4 mg by mouth at bedtime.    Yes Historical Provider, MD    Family History No family history on file.  Social History Social History  Substance Use Topics  . Smoking status: Never Smoker  . Smokeless tobacco: Never Used  . Alcohol use No     Allergies   Aspirin   Review of Systems Review of Systems  Ten systems reviewed and are negative for acute change, except as noted in the HPI.   Physical Exam Updated Vital Signs BP 145/77 (BP Location: Right Arm)   Pulse 73   Temp 98.1 F (36.7 C) (Oral)   Resp 16   Ht 5\' 1"  (1.549 m)   Wt 64.4 kg   SpO2 99%   BMI 26.83 kg/m   Physical Exam  Constitutional: He is oriented to person, place, and time. He appears well-developed and well-nourished. No distress.  HENT:  Head: Normocephalic and atraumatic.  Right Ear: External ear normal.  Left Ear: External ear normal.  Eyes: Conjunctivae and EOM are normal. Pupils are equal, round, and reactive to light. No scleral icterus.  Neck: Normal range of motion. Neck supple. No JVD present. No thyromegaly present.  Cardiovascular: Normal rate, regular rhythm, normal heart sounds and intact distal pulses.  Exam reveals no gallop and no friction rub.   No murmur heard. Pulmonary/Chest:  Effort normal and breath sounds normal. No respiratory distress. He has no wheezes.  Abdominal: Soft. Bowel sounds are normal. He exhibits no distension and no mass. There is no tenderness. There is no rebound and no guarding.  Musculoskeletal: Normal range of motion. He exhibits no edema, tenderness or deformity.  Neurological: He is alert and oriented to person, place, and time. He has normal reflexes.  Speech is clear and goal oriented, follows commands Major Cranial nerves without deficit, no facial droop Normal strength in upper and lower extremities bilaterally including dorsiflexion and plantar flexion, strong and equal grip  strength Sensation normal to light and sharp touch Moves extremities without ataxia, coordination intact Normal finger to nose and rapid alternating movements Neg romberg, no pronator drift Normal gait Normal heel-shin and balance   Skin: Skin is warm and dry. Capillary refill takes less than 2 seconds. No rash noted. He is not diaphoretic.  Psychiatric: His behavior is normal.  Nursing note and vitals reviewed.    ED Treatments / Results  Labs (all labs ordered are listed, but only abnormal results are displayed) Labs Reviewed  COMPREHENSIVE METABOLIC PANEL - Abnormal; Notable for the following:       Result Value   BUN 23 (*)    Calcium 8.7 (*)    ALT 12 (*)    All other components within normal limits  CBC WITH DIFFERENTIAL/PLATELET - Abnormal; Notable for the following:    RBC 4.17 (*)    Hemoglobin 12.5 (*)    HCT 38.1 (*)    All other components within normal limits  URINALYSIS, ROUTINE W REFLEX MICROSCOPIC (NOT AT [place])  CBG MONITORING, ED    EKG  EKG Interpretation None       Radiology No results found.  Procedures Procedures (including critical care time)  Medications Ordered in ED Medications - No data to display   Initial Impression / Assessment and Plan / ED Course  I have reviewed the triage vital signs and the nursing notes.  Pertinent labs & imaging results that were available during my care of the patient were reviewed by me and considered in my medical decision making (see chart for details).  Clinical Course  Value Comment By Time  WBC: 6.0 (Reviewed) Margarita Mail, PA-C 07/31 1649  ED EKG (Reviewed) Margarita Mail, PA-C 07/31 1650    Patient with mild anemia . He has some hypertension . He denies any falls and has no evidence of hematoma. He is well-appearing, normal gait, no weakness, patient easily is able to stand on one leg and can only walk. He has a little bit of stiffness and weakness with standing, but is otherwise  well-appearing without significant lab abnormalities. UA is negative, neurologic exam is unremarkable. The patient appears safe for discharge at this time. I doubt other abnormality such as TIA , subdural hematoma. He appears safe for discharge at this time. [facility]  Final Clinical Impressions(s) / ED Diagnoses   Final diagnoses:  Examination    New Prescriptions Discharge Medication List as of 07/18/2016  5:17 PM       Margarita Mail, PA-C 07/19/16 1718    Quintella Reichert, MD 07/21/16 1353

## 2016-07-18 NOTE — Discharge Instructions (Signed)
Your exam shows very mild low blood count.  Please see your doctor at the [place]. No other abnormalities found.

## 2017-02-03 ENCOUNTER — Emergency Department (HOSPITAL_COMMUNITY): Payer: Medicare Other

## 2017-02-03 ENCOUNTER — Emergency Department (HOSPITAL_COMMUNITY)
Admission: EM | Admit: 2017-02-03 | Discharge: 2017-02-03 | Disposition: A | Discharge status: Home / Self Care | Payer: Medicare Other | Attending: Physician Assistant | Admitting: Physician Assistant

## 2017-02-03 ENCOUNTER — Encounter (HOSPITAL_COMMUNITY): Payer: Self-pay | Admitting: Emergency Medicine

## 2017-02-03 DIAGNOSIS — E119 Type 2 diabetes mellitus without complications: Secondary | ICD-10-CM | POA: Diagnosis not present

## 2017-02-03 DIAGNOSIS — M79604 Pain in right leg: Secondary | ICD-10-CM | POA: Diagnosis not present

## 2017-02-03 DIAGNOSIS — J069 Acute upper respiratory infection, unspecified: Secondary | ICD-10-CM

## 2017-02-03 DIAGNOSIS — I10 Essential (primary) hypertension: Secondary | ICD-10-CM | POA: Insufficient documentation

## 2017-02-03 DIAGNOSIS — R05 Cough: Secondary | ICD-10-CM | POA: Diagnosis present

## 2017-02-03 DIAGNOSIS — B9789 Other viral agents as the cause of diseases classified elsewhere: Secondary | ICD-10-CM

## 2017-02-03 HISTORY — DX: Sleep apnea, unspecified: G47.30

## 2017-02-03 MED ORDER — ACETAMINOPHEN-CODEINE 120-12 MG/5ML PO SOLN: 10.0000 mL | ORAL | 0 refills | Status: DC | PRN

## 2017-02-03 MED ORDER — IBUPROFEN 800 MG PO TABS: 800.0000 mg | ORAL_TABLET | Freq: Three times a day (TID) | ORAL | 0 refills | Status: DC | PRN

## 2017-02-03 MED ORDER — GUAIFENESIN ER 1200 MG PO TB12: 1.0000 | ORAL_TABLET | Freq: Two times a day (BID) | ORAL | 0 refills | Status: DC

## 2017-02-03 NOTE — Discharge Instructions (Signed)
Return here as needed.  Follow-up with your primary care doctor.  Use ice and heat on your leg

## 2017-02-03 NOTE — ED Notes (Signed)
PT DISCHARGED. INSTRUCTIONS AND PRESCRIPTIONS GIVEN. AAOX4. PT IN NO APPARENT DISTRESS. THE OPPORTUNITY TO ASK QUESTIONS WAS PROVIDED. 

## 2017-02-03 NOTE — ED Provider Notes (Signed)
[place] DEPT Provider Note   CSN: [id number] Arrival date & time: 02/03/17  1912 By signing my name below, I, Georgette Shell, attest that this documentation has been prepared under the direction and in the presence of [employer], PA-C. Electronically Signed: Georgette Shell, ED Scribe. 02/03/17. 8:28 PM.  History   Chief Complaint Chief Complaint  Patient presents with  . Leg Pain    right    HPI The history is provided by the patient. No language interpreter was used.   HPI Comments: Anthony Sheppard is a 81 y.o. male with h/o DM and HTN, who presents to the Emergency Department complaining of right lower leg pain onset ~1.5 weeks ago. Pain is exacerbated with ambulating. Denies any recent injury or trauma. He states he has taken Aleve with no relief to his symptoms.   Additionally, pt is complaining of rhinorrhea and cough beginning two weeks ago. He has not tried any OTC medications for this. No known sick contacts with similar symptoms.  Pt denies fever, chills, nausea, vomiting, diarrhea, or any other associated symptoms.   Past Medical History:  Diagnosis Date  . Diabetes mellitus   . Enlarged prostate   . Hypertension   . Sleep apnea     There are no active problems to display for this patient.  History reviewed. No pertinent surgical history.   Home Medications    Prior to Admission medications   Medication Sig Start Date End Date Taking? Authorizing Provider  Chlorpheniramine Maleate (ALLERGY PO) Take 1 tablet by mouth daily as needed (allergies).    Historical Provider, MD  finasteride (PROSCAR) 5 MG tablet Take 5 mg by mouth at bedtime.    Historical Provider, MD  tamsulosin (FLOMAX) 0.4 MG CAPS capsule Take 0.4 mg by mouth at bedtime.     Historical Provider, MD    Family History No family history on file.  Social History Social History  Substance Use Topics  . Smoking status: Never Smoker  . Smokeless tobacco: Never Used  . Alcohol use No      Allergies   Aspirin   Review of Systems Review of Systems 10 Systems reviewed and all are negative for acute change except as noted in the HPI. Physical Exam Updated Vital Signs BP 110/65 (BP Location: Left Arm)   Pulse 92   Temp 99.1 F (37.3 C) (Oral)   Resp 14   Ht 4\' 11"  (1.499 m)   Wt 137 lb 1.6 oz (62.2 kg)   SpO2 94%   BMI 27.69 kg/m   Physical Exam  Constitutional: He appears well-developed and well-nourished.  HENT:  Head: Normocephalic.  Eyes: Conjunctivae are normal.  Cardiovascular: Normal rate.   Pulmonary/Chest: Effort normal. No respiratory distress.  Abdominal: He exhibits no distension.  Musculoskeletal: Normal range of motion.  Neurological: He is alert.  Skin: Skin is warm and dry.  Psychiatric: He has a normal mood and affect. His behavior is normal.  Nursing note and vitals reviewed.    ED Treatments / Results  DIAGNOSTIC STUDIES: Oxygen Saturation is 94% on RA, adequate by my interpretation.    COORDINATION OF CARE: 8:28 PM Discussed treatment plan with pt at bedside which includes x-ray and pt agreed to plan.  Labs (all labs ordered are listed, but only abnormal results are displayed) Labs Reviewed - No data to display  EKG  EKG Interpretation None       Radiology No results found.  Procedures Procedures (including critical care time)  Medications Ordered in  ED Medications - No data to display   Initial Impression / Assessment and Plan / ED Course  I have reviewed the triage vital signs and the nursing notes.  Pertinent labs & imaging results that were available during my care of the patient were reviewed by me and considered in my medical decision making (see chart for details).   a she has been stable here in the emergency department.  I feel that he does have an upper respiratory influenza-like illness will treat for this.  Have him follow-up with his primary care doctor.  His lower extremity pain is pretty  specific to the anterior shin.  There is no posterior calf pain or other pain in his leg.  Patient is advised to use ice and heat on the area that is sore  Final Clinical Impressions(s) / ED Diagnoses   Final diagnoses:  None    New Prescriptions New Prescriptions   No medications on file  I personally performed the services described in this documentation, which was scribed in my presence. The recorded information has been reviewed and is accurate.   Dalia Heading, PA-C 02/07/17 [place], MD 02/08/17 1456

## 2017-02-03 NOTE — ED Triage Notes (Signed)
Pt c/o right lower leg pain for a week and a half, denies any injury. No relief with aleve. Pt adds cold like symptoms-runny nose and cough. Denies N/V/D.

## 2019-10-09 ENCOUNTER — Ambulatory Visit: Payer: Medicare Other | Admitting: Orthopaedic Surgery

## 2019-10-23 ENCOUNTER — Ambulatory Visit: Payer: Self-pay

## 2019-10-23 ENCOUNTER — Ambulatory Visit (INDEPENDENT_AMBULATORY_CARE_PROVIDER_SITE_OTHER): Payer: Medicare Other | Admitting: Orthopaedic Surgery

## 2019-10-23 ENCOUNTER — Encounter: Payer: Self-pay | Admitting: Orthopaedic Surgery

## 2019-10-23 ENCOUNTER — Other Ambulatory Visit: Payer: Self-pay | Admitting: Orthopaedic Surgery

## 2019-10-23 ENCOUNTER — Ambulatory Visit (INDEPENDENT_AMBULATORY_CARE_PROVIDER_SITE_OTHER): Payer: Medicare Other

## 2019-10-23 DIAGNOSIS — M79671 Pain in right foot: Secondary | ICD-10-CM | POA: Diagnosis not present

## 2019-10-23 DIAGNOSIS — M79672 Pain in left foot: Secondary | ICD-10-CM

## 2019-10-23 MED ORDER — LIDOCAINE HCL 1 % IJ SOLN
1.0000 mL | INTRAMUSCULAR | Status: AC | PRN
  Administered 2019-10-23: 1 mL

## 2019-10-23 MED ORDER — METHYLPREDNISOLONE ACETATE 40 MG/ML IJ SUSP
40.0000 mg | INTRAMUSCULAR | Status: AC | PRN
  Administered 2019-10-23: 40 mg via INTRA_ARTICULAR

## 2019-10-23 NOTE — Progress Notes (Signed)
Office Visit Note   Patient: Anthony Sheppard           Date of Birth: 10-23-1936           MRN: [id number] Visit Date: 10/23/2019              Requested by: No referring provider defined for this encounter. PCP: System, Provider Not In   Assessment & Plan: Visit Diagnoses:  1. Pain in right foot   2. Pain in left foot     Plan: Recommended patient monitor his glucose levels over the next few days as he understands that the cortisone injection given today could raise his glucose levels.  In regards to his foot recommend that he have the orthotics made.  [employer] has any questions about the type of orthotics age-mates that he can definitely call our office made patient and his wife aware of this.  Follow-up with [place] as needed.  Questions encouraged and answered  Follow-Up Instructions: Return if symptoms worsen or fail to improve.   Orders:  Orders Placed This Encounter  Procedures  . Foot Inj   No orders of the defined types were placed in this encounter.     Procedures: Small Joint Inj (Lisfranc joint right foot) on 10/23/2019 10:16 AM Indications: pain Details: 27 G needle, dorsal approach  Spinal Needle: No  Medications: 1 mL lidocaine 1 %; 40 mg methylPREDNISolone acetate 40 MG/ML Consent was given by the patient. Immediately prior to procedure a time out was called to verify the correct patient, procedure, equipment, support staff and site/side marked as required. Patient was prepped and draped in the usual sterile fashion.       Clinical Data: No additional findings.   Subjective: Chief Complaint  Patient presents with  . Left Foot - Pain  . Right Foot - Pain    HPI Anthony Sheppard is a 83 year old male were seen for the first time.  Comes in today with right foot pain.  States he injured his foot while in the [employer] and treated back in 1960s.  He had orthotics while he was in the [employer] but has not had any orthotics since then.  [facility] which is [facility] according to patient and his wife is present.  He is requesting an injection in his right foot which [facility] by the [facility] some 6 months ago was very helpful.  Having no pain in his left foot.  Patient is diabetic but is unsure what his glucose levels or hemoglobin A1c was last.  Review of Systems Negative for fevers chills shortness of breath chest pain.  Objective: Vital Signs: There were no vitals taken for this visit.  Physical Exam Cardiovascular:     Pulses: Normal pulses.  Pulmonary:     Effort: Pulmonary effort is normal.  Neurological:     Mental Status: He is alert and oriented to person, place, and time.  Psychiatric:        Mood and Affect: Mood normal.     Ortho Exam Bilateral feet sensation grossly intact.  No rashes skin lesions or impending ulcer.  Pes planus right foot.  He has tenderness at the Lisfranc joint between the first and second metatarsals with palpation.  Remainder the foot is nontender.  Left foot nontender throughout.  Unable to do single heel raise on the on the right.  Able to perform to perform a single heel raise on the left.  Weakness with inversion of the right foot against resistance otherwise inversion eversion feet 5 out of 5.  Dorsiflexion plantarflexion bilateral ankles intact.  Dorsal pedal pulses are intact bilaterally.  Specialty Comments:  No specialty comments available.  Imaging: Xr Foot Complete Left  Result Date: 10/23/2019 Left foot 3 views: No acute fractures.  No bony abnormalities.  No significant arthritic changes throughout the foot.  Xr Foot Complete Right  Result Date: 10/23/2019 Right foot 3 views: Arthritic changes at the Lisfranc joint between the first and second metatarsal heads with a dorsal spur.  Remaining Lisfranc joints are well aligned and without arthritic changes.. Pes planus deformity.  No acute fractures.  Sesamoid bones are well located.  Slight Morton's  type foot with the second third metatarsals being longer than the first.    PMFS History: There are no active problems to display for this patient.  Past Medical History:  Diagnosis Date  . Diabetes mellitus   . Enlarged prostate   . Hypertension   . Sleep apnea     History reviewed. No pertinent family history.  History reviewed. No pertinent surgical history. Social History   Occupational History  . Not on file  Tobacco Use  . Smoking status: Never Smoker  . Smokeless tobacco: Never Used  Substance and Sexual Activity  . Alcohol use: No  . Drug use: No  . Sexual activity: Not on file

## 2019-10-30 ENCOUNTER — Ambulatory Visit (HOSPITAL_COMMUNITY)
Admission: EM | Admit: 2019-10-30 | Discharge: 2019-10-30 | Disposition: A | Discharge status: Home / Self Care | Payer: Medicare Other

## 2019-10-30 ENCOUNTER — Encounter (HOSPITAL_COMMUNITY): Payer: Self-pay

## 2019-10-30 DIAGNOSIS — R109 Unspecified abdominal pain: Secondary | ICD-10-CM

## 2019-10-30 LAB — POCT URINALYSIS DIP (DEVICE)
Bilirubin Urine: NEGATIVE
Glucose, UA: NEGATIVE mg/dL
Hgb urine dipstick: NEGATIVE
Ketones, ur: NEGATIVE mg/dL
Leukocytes,Ua: NEGATIVE
Nitrite: NEGATIVE
Protein, ur: NEGATIVE mg/dL
Specific Gravity, Urine: 1.02 (ref 1.005–1.030)
Urobilinogen, UA: 0.2 mg/dL (ref 0.0–1.0)
pH: 5.5 (ref 5.0–8.0)

## 2019-10-30 NOTE — ED Notes (Signed)
Patient's caregiver states patient will not drink adequate water and want's the provider to know.  She does not feel comfortable telling provider in front of patient.

## 2019-10-30 NOTE — ED Triage Notes (Signed)
Pt presents to the [facility] with right flank pain x 1 week. Pt states he is having burning sensation when urinating x 1 week. Pt states he thinks he has a kidney infection.

## 2019-10-30 NOTE — Discharge Instructions (Signed)
Your urine is completely negative for any infection or blood which could be a sign of kidney stone. Come back as needed.

## 2019-10-30 NOTE — ED Provider Notes (Addendum)
[place]    CSN: [id number] Arrival date & time: 10/30/19  [id number]      History   Chief Complaint Chief Complaint  Patient presents with   Appointment    9:30   Flank Pain   Dysuria    HPI Elnora Schnelle is a 83 y.o. male. who presents with a sudden onset of R flank pain around 10 days ago, with no dysuria, but always has frequency due to prostate problems. Then a few days ago, he sneezed and the R flank pain resolved. Denies blood in urine, fever, chills,. N/V.     Past Medical History:  Diagnosis Date   Diabetes mellitus    Enlarged prostate    Hypertension    Sleep apnea     There are no active problems to display for this patient.   History reviewed. No pertinent surgical history.     Home Medications    Prior to Admission medications   Medication Sig Start Date End Date Taking? Authorizing Provider  Cetirizine HCl (ZYRTEC ALLERGY) 10 MG CAPS Take by mouth.   Yes [provider]  acetaminophen-codeine 120-12 MG/5ML solution Take 10 mLs by mouth every 4 (four) hours as needed for moderate pain. 02/03/17   Lawyer, Harrell Gave, PA-C  Chlorpheniramine Maleate (ALLERGY PO) Take 1 tablet by mouth daily as needed (allergies).    [provider]  finasteride (PROSCAR) 5 MG tablet Take 5 mg by mouth at bedtime.    [provider]  Guaifenesin 1200 MG TB12 Take 1 tablet (1,200 mg total) by mouth 2 (two) times daily. 02/03/17   Lawyer, Harrell Gave, PA-C  ibuprofen (ADVIL,MOTRIN) 800 MG tablet Take 1 tablet (800 mg total) by mouth every 8 (eight) hours as needed. 02/03/17   Lawyer, Harrell Gave, PA-C  tamsulosin (FLOMAX) 0.4 MG CAPS capsule Take 0.4 mg by mouth at bedtime.     [provider]    Family History History reviewed. No pertinent family history.  Social History Social History   Tobacco Use   Smoking status: Never Smoker   Smokeless tobacco: Never Used  Substance Use Topics   Alcohol use: No    Drug use: No     Allergies   Aspirin   Review of Systems Review of Systems  Constitutional: Negative for appetite change, chills and fever.  Gastrointestinal: Negative for abdominal pain, nausea and vomiting.  Genitourinary: Positive for flank pain and frequency. Negative for difficulty urinating, dysuria, hematuria and urgency.  Musculoskeletal: Positive for back pain. Negative for gait problem and myalgias.  Skin: Negative for rash.  Neurological: Negative for numbness.     Physical Exam Triage Vital Signs ED Triage Vitals  Enc Vitals Group     BP 10/30/19 0951 113/62     Pulse Rate 10/30/19 0951 73     Resp 10/30/19 0951 16     Temp 10/30/19 0951 97.6 F (36.4 C)     Temp Source 10/30/19 0951 Temporal     SpO2 10/30/19 0951 97 %     Weight --      Height --      Head Circumference --      Peak Flow --      Pain Score 10/30/19 0948 8     Pain Loc --      Pain Edu? --      Excl. in [place]? --    No data found.  Updated Vital Signs BP 113/62 (BP Location: Left Arm)    Pulse 73  Temp 97.6 F (36.4 C) (Temporal)    Resp 16    SpO2 97%   Visual Acuity Right Eye Distance:   Left Eye Distance:   Bilateral Distance:    Right Eye Near:   Left Eye Near:    Bilateral Near:     Physical Exam Vitals signs and nursing note reviewed.  Constitutional:      General: He is not in acute distress.    Appearance: Normal appearance. He is not toxic-appearing.  HENT:     Head: Atraumatic.     Nose: Nose normal.  Eyes:     General: No scleral icterus.    Conjunctiva/sclera: Conjunctivae normal.  Neck:     Musculoskeletal: Neck supple.  Pulmonary:     Effort: Pulmonary effort is normal.  Abdominal:     General: Bowel sounds are normal. There is no distension.     Palpations: Abdomen is soft. There is no mass.     Tenderness: There is no abdominal tenderness. There is no right CVA tenderness, left CVA tenderness, guarding or rebound.     Hernia: No hernia is present.   Musculoskeletal: Normal range of motion.     Comments: BACK- has mild stiffness to R lateral flexion, but no pain, the rest of of spine ROM is normal. Neg straight leg raise. He does not have any local tenderness on back with palpation.   Skin:    General: Skin is warm and dry.     Findings: No rash.  Neurological:     Mental Status: He is alert and oriented to person, place, and time.     Motor: No weakness.     Gait: Gait normal.     Deep Tendon Reflexes: Reflexes normal.  Psychiatric:        Mood and Affect: Mood normal.        Behavior: Behavior normal.        Thought Content: Thought content normal.        Judgment: Judgment normal.    UC Treatments / Results  Labs (all labs ordered are listed, but only abnormal results are displayed)  UA- negative UA EKG   Radiology No results found.   Medications Ordered in UC Medications - No data to display  Initial Impression / Assessment and Plan / UC Course  I have reviewed the triage vital signs and the nursing notes.  Pertinent labs results that were available during my care of the patient were reviewed by me and considered in my medical decision making (see chart for details). I explained to pt more likely he may have had a muscular strain which has now resolved and there is nothing else he needs to do. FU prn.  Clinical Course as of Oct 29 1025  Tue Oct 30, 2019  1018 UA is completely negative   [SR]    Clinical Course User Index [SR] Rodriguez-Southworth, Sunday Spillers, [place]     Final Clinical Impressions(s) / UC Diagnoses   Final diagnoses:  Right flank pain     Discharge Instructions     Your urine is completely negative for any infection or blood which could be a sign of kidney stone. Come back as needed.     ED Prescriptions    None     PDMP not reviewed this encounter.   Shelby Mattocks, PA-C 10/30/19 1028    Rodriguez-Southworth, [place], PA-C 10/30/19 1029

## 2020-02-04 ENCOUNTER — Other Ambulatory Visit: Payer: Self-pay

## 2020-02-04 ENCOUNTER — Encounter (HOSPITAL_COMMUNITY): Payer: Self-pay

## 2020-02-04 ENCOUNTER — Ambulatory Visit (HOSPITAL_COMMUNITY)
Admission: EM | Admit: 2020-02-04 | Discharge: 2020-02-04 | Disposition: A | Discharge status: Home / Self Care | Payer: Medicare Other | Attending: Family Medicine | Admitting: Family Medicine

## 2020-02-04 ENCOUNTER — Ambulatory Visit (INDEPENDENT_AMBULATORY_CARE_PROVIDER_SITE_OTHER): Payer: Medicare Other

## 2020-02-04 DIAGNOSIS — M898X6 Other specified disorders of bone, lower leg: Secondary | ICD-10-CM

## 2020-02-04 NOTE — ED Provider Notes (Signed)
[place]    CSN: [id number] Arrival date & time: 02/04/20  1646      History   Chief Complaint Chief Complaint  Patient presents with  . Appointment  . (5:00 Leg Pain)    HPI Anthony Sheppard is a 84 y.o. male.   Anthony Sheppard is presenting with right anterior tibial pain in the distal third of the tibia.  He denies any inciting event or trauma.  He has taken a bunch Advil and his pain is improved.  Denies any problem with ambulation.  No color changes or ecchymosis.  No history of similar symptoms.  Pain was present for a week before it resolved.  HPI  Past Medical History:  Diagnosis Date  . Diabetes mellitus   . Enlarged prostate   . Hypertension   . Sleep apnea     There are no problems to display for this patient.   History reviewed. No pertinent surgical history.     Home Medications    Prior to Admission medications   Medication Sig Start Date End Date Taking? Authorizing Provider  acetaminophen-codeine 120-12 MG/5ML solution Take 10 mLs by mouth every 4 (four) hours as needed for moderate pain. 02/03/17   Lawyer, Harrell Gave, PA-C  Cetirizine HCl (ZYRTEC ALLERGY) 10 MG CAPS Take by mouth.    [provider]  Chlorpheniramine Maleate (ALLERGY PO) Take 1 tablet by mouth daily as needed (allergies).    [provider]  finasteride (PROSCAR) 5 MG tablet Take 5 mg by mouth at bedtime.    [provider]  Guaifenesin 1200 MG TB12 Take 1 tablet (1,200 mg total) by mouth 2 (two) times daily. 02/03/17   Lawyer, Harrell Gave, PA-C  ibuprofen (ADVIL,MOTRIN) 800 MG tablet Take 1 tablet (800 mg total) by mouth every 8 (eight) hours as needed. 02/03/17   Lawyer, Harrell Gave, PA-C  tamsulosin (FLOMAX) 0.4 MG CAPS capsule Take 0.4 mg by mouth at bedtime.     [provider]    Family History History reviewed. No pertinent family history.  Social History Social History   Tobacco Use  . Smoking status: Never Smoker  .  Smokeless tobacco: Never Used  Substance Use Topics  . Alcohol use: No  . Drug use: No     Allergies   Aspirin   Review of Systems Review of Systems See HPI  Physical Exam Triage Vital Signs ED Triage Vitals  Enc Vitals Group     BP 02/04/20 1725 (!) 163/69     Pulse Rate 02/04/20 1725 62     Resp 02/04/20 1725 17     Temp 02/04/20 1725 98.2 F (36.8 C)     Temp Source 02/04/20 1725 Oral     SpO2 02/04/20 1725 96 %     Weight --      Height --      Head Circumference --      Peak Flow --      Pain Score 02/04/20 1726 8     Pain Loc --      Pain Edu? --      Excl. in [place]? --    No data found.  Updated Vital Signs BP (!) 163/69 (BP Location: Left Arm)   Pulse 62   Temp 98.2 F (36.8 C) (Oral)   Resp 17   SpO2 96%   Visual Acuity Right Eye Distance:   Left Eye Distance:   Bilateral Distance:    Right Eye Near:   Left Eye Near:  Bilateral Near:     Physical Exam Gen: NAD, alert, cooperative with exam, well-appearing ENT: normal lips, normal nasal mucosa,  Eye: normal EOM, normal conjunctiva and lids  Skin: no rashes, no areas of induration  Neuro: normal tone, normal sensation to touch Psych:  normal insight, alert and oriented MSK:  Right lower leg: No ecchymosis or swelling. No significant tenderness to palpation over the tibial shaft. Normal knee range of motion. Normal plantarflexion and dorsiflexion. Normal strength resistance. Neurovascularly intact  UC Treatments / Results  Labs (all labs ordered are listed, but only abnormal results are displayed) Labs Reviewed - No data to display  EKG   Radiology DG Tibia/Fibula Right  Result Date: 02/04/2020 CLINICAL DATA:  Right leg pain EXAM: RIGHT TIBIA AND FIBULA - 2 VIEW COMPARISON:  02/03/2017 FINDINGS: There is no evidence of fracture or other focal bone lesions. Soft tissues are unremarkable. IMPRESSION: No fracture or dislocation of the right tibia or fibula. No radiographic findings to  explain pain. Electronically Signed   By: Eddie Candle M.D.   On: 02/04/2020 18:27    Procedures Procedures (including critical care time)  Medications Ordered in UC Medications - No data to display  Initial Impression / Assessment and Plan / UC Course  I have reviewed the triage vital signs and the nursing notes.  Pertinent labs & imaging results that were available during my care of the patient were reviewed by me and considered in my medical decision making (see chart for details).     Anthony Sheppard is an 84 year old male that is presenting with right anterior tibial pain.  No findings on clinical exam.  Imaging was negative.  He feels like the pain is improved.  Possible for shin splint.  Less likely for stress reaction.  Counseled on supportive care.  Given indications to follow-up return.  Final Clinical Impressions(s) / UC Diagnoses   Final diagnoses:  Tibial pain     Discharge Instructions     Please try ice on the area  Please try not to take too much advil  Please follow up if your symptoms fail to improve    ED Prescriptions    None     PDMP not reviewed this encounter.   Rosemarie Ax, MD 02/04/20 [phone]

## 2020-02-04 NOTE — Discharge Instructions (Signed)
Please try ice on the area  Please try not to take too much advil  Please follow up if your symptoms fail to improve

## 2020-02-04 NOTE — ED Triage Notes (Signed)
Pt presents with right leg pain in shin area X 1 week.

## 2020-02-21 ENCOUNTER — Ambulatory Visit: Payer: Medicare Other | Attending: Internal Medicine

## 2020-02-21 DIAGNOSIS — Z23 Encounter for immunization: Secondary | ICD-10-CM | POA: Insufficient documentation

## 2020-02-21 NOTE — Progress Notes (Signed)
   Covid-19 Vaccination Clinic  Name:  Anthony Sheppard    MRN: [id number] DOB: Aug 16, 1936  02/21/2020  [name] was observed post Covid-19 immunization for 15 minutes without incident. He was provided with Vaccine Information Sheet and instruction to access the V-Safe system.   [name] was instructed to call 911 with any severe reactions post vaccine: Marland Kitchen Difficulty breathing  . Swelling of face and throat  . A fast heartbeat  . A bad rash all over body  . Dizziness and weakness   Immunizations Administered    Name Date Dose VIS Date Route   Pfizer COVID-19 Vaccine 02/21/2020  8:28 AM 0.3 mL 11/30/2019 Intramuscular   Manufacturer: [place]   Lot: [id number]   [place]: [id number]

## 2020-03-18 ENCOUNTER — Ambulatory Visit: Payer: Medicare Other | Attending: Internal Medicine

## 2020-03-18 DIAGNOSIS — Z23 Encounter for immunization: Secondary | ICD-10-CM

## 2020-03-18 NOTE — Progress Notes (Signed)
   Covid-19 Vaccination Clinic  Name:  Loral Demarchi    MRN: [id number] DOB: 1936/04/02  03/18/2020  Mr. Serino was observed post Covid-19 immunization for 15 minutes without incident. He was provided with Vaccine Information Sheet and instruction to access the V-Safe system.   Mr. Winkles was instructed to call 911 with any severe reactions post vaccine: Marland Kitchen Difficulty breathing  . Swelling of face and throat  . A fast heartbeat  . A bad rash all over body  . Dizziness and weakness   Immunizations Administered    Name Date Dose VIS Date Route   Pfizer COVID-19 Vaccine 03/18/2020 12:27 PM 0.3 mL 11/30/2019 Intramuscular   Manufacturer: [place]   Lot: [id number]   [place]: [id number]

## 2020-06-27 ENCOUNTER — Emergency Department (HOSPITAL_COMMUNITY)
Admission: EM | Admit: 2020-06-27 | Discharge: 2020-06-27 | Disposition: A | Discharge status: Home / Self Care | Payer: PRIVATE HEALTH INSURANCE | Attending: Emergency Medicine | Admitting: Emergency Medicine

## 2020-06-27 ENCOUNTER — Other Ambulatory Visit: Payer: Self-pay

## 2020-06-27 ENCOUNTER — Emergency Department (HOSPITAL_COMMUNITY): Payer: PRIVATE HEALTH INSURANCE

## 2020-06-27 DIAGNOSIS — X500XXA Overexertion from strenuous movement or load, initial encounter: Secondary | ICD-10-CM | POA: Diagnosis not present

## 2020-06-27 DIAGNOSIS — Y939 Activity, unspecified: Secondary | ICD-10-CM | POA: Insufficient documentation

## 2020-06-27 DIAGNOSIS — Z7984 Long term (current) use of oral hypoglycemic drugs: Secondary | ICD-10-CM | POA: Diagnosis not present

## 2020-06-27 DIAGNOSIS — Y999 Unspecified external cause status: Secondary | ICD-10-CM | POA: Diagnosis not present

## 2020-06-27 DIAGNOSIS — Y929 Unspecified place or not applicable: Secondary | ICD-10-CM | POA: Insufficient documentation

## 2020-06-27 DIAGNOSIS — S46911A Strain of unspecified muscle, fascia and tendon at shoulder and upper arm level, right arm, initial encounter: Secondary | ICD-10-CM | POA: Insufficient documentation

## 2020-06-27 DIAGNOSIS — E119 Type 2 diabetes mellitus without complications: Secondary | ICD-10-CM | POA: Insufficient documentation

## 2020-06-27 DIAGNOSIS — I10 Essential (primary) hypertension: Secondary | ICD-10-CM | POA: Insufficient documentation

## 2020-06-27 DIAGNOSIS — S4991XA Unspecified injury of right shoulder and upper arm, initial encounter: Secondary | ICD-10-CM | POA: Diagnosis present

## 2020-06-27 MED ORDER — METHOCARBAMOL 500 MG PO TABS
500.0000 mg | ORAL_TABLET | Freq: Two times a day (BID) | ORAL | 0 refills | Status: DC
Start: 2020-06-27 — End: 2023-02-08

## 2020-06-27 MED ORDER — METHOCARBAMOL 500 MG PO TABS
500.0000 mg | ORAL_TABLET | Freq: Two times a day (BID) | ORAL | 0 refills | Status: DC
Start: 2020-06-27 — End: 2020-06-27

## 2020-06-27 NOTE — ED Provider Notes (Signed)
[place] EMERGENCY DEPARTMENT Provider Note   CSN: [id number] Arrival date & time: 06/27/20  1512     History Chief Complaint  Patient presents with  . Shoulder Pain    Anthony Sheppard is a 84 y.o. male.  84 year old male complaining of pain to his right shoulder.  Pain is characterizes sharp and started after he reached up to grab an object.  Pain is positional and he has tried NSAIDs with some relief.  No prior history of shoulder injury        Past Medical History:  Diagnosis Date  . Diabetes mellitus   . Enlarged prostate   . Hypertension   . Sleep apnea     There are no problems to display for this patient.   No past surgical history on file.     No family history on file.  Social History   Tobacco Use  . Smoking status: Never Smoker  . Smokeless tobacco: Never Used  Substance Use Topics  . Alcohol use: No  . Drug use: No    Home Medications Prior to Admission medications   Medication Sig Start Date End Date Taking? Authorizing Provider  acetaminophen-codeine 120-12 MG/5ML solution Take 10 mLs by mouth every 4 (four) hours as needed for moderate pain. 02/03/17   Lawyer, Harrell Gave, PA-C  Cetirizine HCl (ZYRTEC ALLERGY) 10 MG CAPS Take by mouth.    [provider]  Chlorpheniramine Maleate (ALLERGY PO) Take 1 tablet by mouth daily as needed (allergies).    [provider]  finasteride (PROSCAR) 5 MG tablet Take 5 mg by mouth at bedtime.    [provider]  Guaifenesin 1200 MG TB12 Take 1 tablet (1,200 mg total) by mouth 2 (two) times daily. 02/03/17   Lawyer, Harrell Gave, PA-C  ibuprofen (ADVIL,MOTRIN) 800 MG tablet Take 1 tablet (800 mg total) by mouth every 8 (eight) hours as needed. 02/03/17   Lawyer, Harrell Gave, PA-C  tamsulosin (FLOMAX) 0.4 MG CAPS capsule Take 0.4 mg by mouth at bedtime.     [provider]    Allergies    Aspirin  Review of Systems   Review of Systems  All other  systems reviewed and are negative.   Physical Exam Updated Vital Signs BP 113/63 (BP Location: Left Arm)   Pulse 70   Temp 99.2 F (37.3 C) (Oral)   Resp 16   SpO2 97%   Physical Exam Vitals and nursing note reviewed.  Constitutional:      General: He is not in acute distress.    Appearance: Normal appearance. He is well-developed. He is not toxic-appearing.  HENT:     Head: Normocephalic and atraumatic.  Eyes:     General: Lids are normal.     Conjunctiva/sclera: Conjunctivae normal.     Pupils: Pupils are equal, round, and reactive to light.  Neck:     Thyroid: No thyroid mass.     Trachea: No tracheal deviation.  Cardiovascular:     Rate and Rhythm: Normal rate and regular rhythm.     Heart sounds: Normal heart sounds. No murmur heard.  No gallop.   Pulmonary:     Effort: Pulmonary effort is normal. No respiratory distress.     Breath sounds: Normal breath sounds. No stridor. No decreased breath sounds, wheezing, rhonchi or rales.  Abdominal:     General: Bowel sounds are normal. There is no distension.     Palpations: Abdomen is soft.     Tenderness: There is no  abdominal tenderness. There is no rebound.  Musculoskeletal:     Right shoulder: Tenderness and bony tenderness present. No swelling or deformity. Decreased range of motion.     Cervical back: Normal range of motion and neck supple.  Skin:    General: Skin is warm and dry.     Findings: No abrasion or rash.  Neurological:     Mental Status: He is alert and oriented to person, place, and time.     GCS: GCS eye subscore is 4. GCS verbal subscore is 5. GCS motor subscore is 6.     Cranial Nerves: No cranial nerve deficit.     Sensory: No sensory deficit.  Psychiatric:        Speech: Speech normal.        Behavior: Behavior normal.     ED Results / Procedures / Treatments   Labs (all labs ordered are listed, but only abnormal results are displayed) Labs Reviewed - No data to  display  EKG None  Radiology DG Shoulder Right  Result Date: 06/27/2020 CLINICAL DATA:  Shoulder injury EXAM: RIGHT SHOULDER - 2+ VIEW COMPARISON:  None. FINDINGS: There is no evidence of fracture or dislocation. There is no evidence of arthropathy or other focal bone abnormality. Soft tissues are unremarkable. IMPRESSION: Negative. Electronically Signed   By: Fidela Salisbury MD   On: 06/27/2020 16:15    Procedures Procedures (including critical care time)  Medications Ordered in ED Medications - No data to display  ED Course  I have reviewed the triage vital signs and the nursing notes.  Pertinent labs & imaging results that were available during my care of the patient were reviewed by me and considered in my medical decision making (see chart for details).    MDM Rules/Calculators/A&P                          X-ray of shoulder negative.  Suspect ligamentous injury as he is tender at the [facility] joint.  Will place in sling and prescribed medications again referral to orthopedics Final Clinical Impression(s) / ED Diagnoses Final diagnoses:  None    Rx / DC Orders ED Discharge Orders    None       Lacretia Leigh, MD 06/27/20 Bosie Helper

## 2020-06-27 NOTE — ED Triage Notes (Signed)
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 Pain worse with movement.

## 2020-08-18 ENCOUNTER — Ambulatory Visit (INDEPENDENT_AMBULATORY_CARE_PROVIDER_SITE_OTHER): Payer: Medicare Other

## 2020-08-18 ENCOUNTER — Other Ambulatory Visit: Payer: Self-pay

## 2020-08-18 ENCOUNTER — Other Ambulatory Visit: Payer: Self-pay | Admitting: Podiatry

## 2020-08-18 ENCOUNTER — Encounter: Payer: Self-pay | Admitting: Podiatry

## 2020-08-18 ENCOUNTER — Ambulatory Visit (INDEPENDENT_AMBULATORY_CARE_PROVIDER_SITE_OTHER): Payer: Medicare Other | Admitting: Podiatry

## 2020-08-18 DIAGNOSIS — M79671 Pain in right foot: Secondary | ICD-10-CM

## 2020-08-18 DIAGNOSIS — M778 Other enthesopathies, not elsewhere classified: Secondary | ICD-10-CM

## 2020-08-18 NOTE — Progress Notes (Signed)
Subjective:   Patient ID: Anthony Sheppard, male   DOB: 84 y.o.   MRN: [id number]   HPI Patient presents with caregiver stating that he has pain on top of his right foot and its been this way for a number of years.  States has had several injection the last one over a year ago which helped some short-term but the pain does gradually recur.  Patient does not smoke likes to be active   Review of Systems  All other systems reviewed and are negative.       Objective:  Physical Exam Vitals and nursing note reviewed.  Constitutional:      Appearance: He is well-developed.  Pulmonary:     Effort: Pulmonary effort is normal.  Musculoskeletal:        General: Normal range of motion.  Skin:    General: Skin is warm.  Neurological:     Mental Status: He is alert.     Neurovascular status intact muscle strength adequate range of motion within normal limits.  Patient is noted to have discomfort on top of the right foot with inflammation fluid of the midtarsal joint dorsally with extensor inflammation tendinitis.  Patient is found to have good digital perfusion is well oriented x3 and walks with a good heel toe light gait pattern.     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  I went ahead today and I did discuss the condition and x-rays and I did do sterile prep and injected the extensor tendon complex 3 mg Kenalog 5 mg Xylocaine advised on heat ice therapy and diclofenac gel.  I do think this should be repeated every 4 to 6 months and hopefully this will keep his symptoms good and if it comes back earlier patient is to let us know  X-rays indicated there appears to be arthritis midtarsal joint bilateral with also significant flatfoot deformity

## 2021-02-03 ENCOUNTER — Other Ambulatory Visit: Payer: Self-pay

## 2021-02-03 ENCOUNTER — Telehealth (HOSPITAL_COMMUNITY): Payer: Self-pay | Admitting: Emergency Medicine

## 2021-02-03 ENCOUNTER — Ambulatory Visit (HOSPITAL_COMMUNITY)
Admission: EM | Admit: 2021-02-03 | Discharge: 2021-02-03 | Disposition: A | Discharge status: Home / Self Care | Payer: Medicare Other | Attending: Urgent Care | Admitting: Urgent Care

## 2021-02-03 ENCOUNTER — Encounter (HOSPITAL_COMMUNITY): Payer: Self-pay | Admitting: Emergency Medicine

## 2021-02-03 DIAGNOSIS — R350 Frequency of micturition: Secondary | ICD-10-CM | POA: Diagnosis not present

## 2021-02-03 LAB — POCT URINALYSIS DIPSTICK, ED / UC
Bilirubin Urine: NEGATIVE
Glucose, UA: NEGATIVE mg/dL
Hgb urine dipstick: NEGATIVE
Ketones, ur: NEGATIVE mg/dL
Leukocytes,Ua: NEGATIVE
Nitrite: NEGATIVE
Protein, ur: NEGATIVE mg/dL
Specific Gravity, Urine: 1.02 (ref 1.005–1.030)
Urobilinogen, UA: 1 mg/dL (ref 0.0–1.0)
pH: 5.5 (ref 5.0–8.0)

## 2021-02-03 NOTE — ED Provider Notes (Signed)
[place]   MRN: [id number] DOB: 1936-10-09  Subjective:   Anthony Sheppard is a 85 y.o. male presenting for 2-day history of persistent urinary frequency, urinary urgency.  Denies fever, nausea, vomiting, abdominal or flank pain, dysuria, penile discharge, hematuria.  Patient is a diabetic, states that he is very careful with the carbohydrates that he eats as he does not want to take those medicines.  He does get regular follow-up through the [place].  Presents with a family member that states he drinks 6 to 10 cups of coffee a day, also drinks sodas daily.  Drinks about 1 to 2 glasses of water daily.  Has a history of BPH.  No current facility-administered medications for this encounter.  Current Outpatient Medications:  .  finasteride (PROSCAR) 5 MG tablet, Take 5 mg by mouth at bedtime., Disp: , Rfl:  .  tamsulosin (FLOMAX) 0.4 MG CAPS capsule, Take 0.4 mg by mouth at bedtime. , Disp: , Rfl:  .  acetaminophen-codeine 120-12 MG/5ML solution, Take 10 mLs by mouth every 4 (four) hours as needed for moderate pain., Disp: 120 mL, Rfl: 0 .  Cetirizine HCl (ZYRTEC ALLERGY) 10 MG CAPS, Take by mouth., Disp: , Rfl:  .  Chlorpheniramine Maleate (ALLERGY PO), Take 1 tablet by mouth daily as needed (allergies)., Disp: , Rfl:  .  Guaifenesin 1200 MG TB12, Take 1 tablet (1,200 mg total) by mouth 2 (two) times daily., Disp: 20 each, Rfl: 0 .  ibuprofen (ADVIL,MOTRIN) 800 MG tablet, Take 1 tablet (800 mg total) by mouth every 8 (eight) hours as needed., Disp: 21 tablet, Rfl: 0 .  methocarbamol (ROBAXIN) 500 MG tablet, Take 1 tablet (500 mg total) by mouth 2 (two) times daily., Disp: 20 tablet, Rfl: 0   Allergies  Allergen Reactions  . Aspirin Nausea And Vomiting    Past Medical History:  Diagnosis Date  . Diabetes mellitus   . Enlarged prostate   . Hypertension   . Sleep apnea      No past surgical history on file.  No family history on file.  Social History   Tobacco Use   . Smoking status: Never Smoker  . Smokeless tobacco: Never Used  Substance Use Topics  . Alcohol use: No  . Drug use: No    ROS   Objective:   Vitals: BP (!) 173/81 (BP Location: Left Arm)   Pulse 61   Temp (!) 97 F (36.1 C) (Oral)   Resp 18   SpO2 94%   Physical Exam Constitutional:      General: He is not in acute distress.    Appearance: Normal appearance. He is well-developed and normal weight. He is not ill-appearing, toxic-appearing or diaphoretic.  HENT:     Head: Normocephalic and atraumatic.     Right Ear: External ear normal.     Left Ear: External ear normal.     Nose: Nose normal.     Mouth/Throat:     Pharynx: Oropharynx is clear.  Eyes:     General: No scleral icterus.       Right eye: No discharge.        Left eye: No discharge.     Extraocular Movements: Extraocular movements intact.     Pupils: Pupils are equal, round, and reactive to light.  Cardiovascular:     Rate and Rhythm: Normal rate.  Pulmonary:     Effort: Pulmonary effort is normal.  Abdominal:     General: Bowel sounds are  normal. There is no distension.     Palpations: Abdomen is soft. There is no mass.     Tenderness: There is no abdominal tenderness. There is no right CVA tenderness, left CVA tenderness, guarding or rebound.  Musculoskeletal:     Cervical back: Normal range of motion.  Neurological:     Mental Status: He is alert and oriented to person, place, and time.  Psychiatric:        Mood and Affect: Mood normal.        Behavior: Behavior normal.        Thought Content: Thought content normal.        Judgment: Judgment normal.     Results for orders placed or performed during the hospital encounter of 02/03/21 (from the past 24 hour(s))  POC Urinalysis dipstick     Status: None   Collection Time: 02/03/21  8:18 PM  Result Value Ref Range   Glucose, UA NEGATIVE NEGATIVE mg/dL   Bilirubin Urine NEGATIVE NEGATIVE   Ketones, ur NEGATIVE NEGATIVE mg/dL   Specific  Gravity, Urine 1.020 1.005 - 1.030   Hgb urine dipstick NEGATIVE NEGATIVE   pH 5.5 5.0 - 8.0   Protein, ur NEGATIVE NEGATIVE mg/dL   Urobilinogen, UA 1.0 0.0 - 1.0 mg/dL   Nitrite NEGATIVE NEGATIVE   Leukocytes,Ua NEGATIVE NEGATIVE    Assessment and Plan :   PDMP not reviewed this encounter.  1. Urinary frequency     Counseled patient against drinking so much coffee and sodas.  Recommended he hydrate with plain water and limit his coffee intake to just 1 cup a day.  Urine culture pending.  Will treat if result is positive.  Keep close follow-up with PCP through the [place]. Counseled patient on potential for adverse effects with medications prescribed/recommended today, ER and return-to-clinic precautions discussed, patient verbalized understanding.    Jaynee Eagles, PA-C 02/06/21 1126

## 2021-02-03 NOTE — ED Triage Notes (Signed)
Frequent urination for 2 days.  Denies pain with urination.   Patient is a diabetic and is noncompliant with medicines.   No abdominal pain or back pain

## 2021-02-05 LAB — URINE CULTURE: Culture: NO GROWTH

## 2021-04-21 DIAGNOSIS — Z23 Encounter for immunization: Secondary | ICD-10-CM | POA: Diagnosis not present

## 2021-07-06 ENCOUNTER — Encounter: Payer: Self-pay | Admitting: Podiatry

## 2021-08-10 ENCOUNTER — Telehealth: Payer: Self-pay | Admitting: Podiatry

## 2021-08-10 NOTE — Telephone Encounter (Signed)
Calling to inform Dr. Paulla Dolly that [employer] was bought out by another health care supplier. This has apparently caused some conflict in what the [place] is willing to cover. Patient would like to know if there is another supplier in [place] for orthotics/insert.

## 2021-08-11 NOTE — Telephone Encounter (Signed)
Not that I know of .

## 2021-10-12 DIAGNOSIS — Z23 Encounter for immunization: Secondary | ICD-10-CM | POA: Diagnosis not present

## 2021-10-26 DIAGNOSIS — H2513 Age-related nuclear cataract, bilateral: Secondary | ICD-10-CM | POA: Diagnosis not present

## 2022-01-13 ENCOUNTER — Telehealth: Payer: Self-pay | Admitting: Podiatry

## 2022-01-13 NOTE — Telephone Encounter (Signed)
Received a voicemail yesterday afternoon from [place] @ [facility] stating they got a call from pts family about a rx for [employer] for custom inserts but [employer] is saying pt should get off the shelf. And pts family states the [place] has been paying for them. Tay from the [place] states the referral in there system for the pt expired 2021. He asked for a call back to discuss.  I returned call and the extension he left me is not allowing me to transfer to it. It says you are not allowed to transfer to that extension.  Pt has not been seen in our office since 2021 so I am not sure what rx they are talking about.

## 2022-02-01 ENCOUNTER — Encounter: Payer: Self-pay | Admitting: Podiatry

## 2022-02-01 ENCOUNTER — Ambulatory Visit (INDEPENDENT_AMBULATORY_CARE_PROVIDER_SITE_OTHER): Payer: Medicare Other | Admitting: Podiatry

## 2022-02-01 ENCOUNTER — Other Ambulatory Visit: Payer: Self-pay

## 2022-02-01 DIAGNOSIS — M79671 Pain in right foot: Secondary | ICD-10-CM

## 2022-02-01 DIAGNOSIS — M79672 Pain in left foot: Secondary | ICD-10-CM | POA: Diagnosis not present

## 2022-02-02 NOTE — Progress Notes (Signed)
Subjective:   Patient ID: Anthony Sheppard, male   DOB: 86 y.o.   MRN: [id number]   HPI Patient presents with caregiver quite agitated with generalized pain but they are unable to describe it due to agitation the patient experiences   ROS      Objective:  Physical Exam  Neurovascular status intact with patient found to have the no current pain that he cannot articulate to me today with patient found to have mild depression of the arch      Assessment:  Difficult to make determination as to his discomfort with caregiver quite concerned about agitation     Plan:  I did go ahead today I did exam as best as possible trying to answer questions unable to ascertain concerns and patient left in an agitated state.  Will be seen back if we can get him more calm down or he can articulate his problems better

## 2022-02-04 ENCOUNTER — Telehealth: Payer: Self-pay | Admitting: Podiatry

## 2022-02-04 NOTE — Telephone Encounter (Signed)
Pts relative called stating she has contacted the [facility] [place] in [place] and they are sending a referral to [place] for this pt. He has been seen here before but was told he needed a new referral sent. Please call relative who is main contact number to schedule pt.

## 2022-05-30 ENCOUNTER — Emergency Department (HOSPITAL_COMMUNITY)
Admission: EM | Admit: 2022-05-30 | Discharge: 2022-05-30 | Disposition: A | Discharge status: Home / Self Care | Payer: Medicare Other | Attending: Emergency Medicine | Admitting: Emergency Medicine

## 2022-05-30 ENCOUNTER — Emergency Department (HOSPITAL_COMMUNITY): Payer: Medicare Other

## 2022-05-30 ENCOUNTER — Encounter (HOSPITAL_COMMUNITY): Payer: Self-pay | Admitting: Emergency Medicine

## 2022-05-30 ENCOUNTER — Other Ambulatory Visit: Payer: Self-pay

## 2022-05-30 DIAGNOSIS — N2889 Other specified disorders of kidney and ureter: Secondary | ICD-10-CM | POA: Diagnosis not present

## 2022-05-30 DIAGNOSIS — R310 Gross hematuria: Secondary | ICD-10-CM | POA: Diagnosis not present

## 2022-05-30 DIAGNOSIS — N400 Enlarged prostate without lower urinary tract symptoms: Secondary | ICD-10-CM | POA: Diagnosis not present

## 2022-05-30 DIAGNOSIS — E119 Type 2 diabetes mellitus without complications: Secondary | ICD-10-CM | POA: Insufficient documentation

## 2022-05-30 DIAGNOSIS — I10 Essential (primary) hypertension: Secondary | ICD-10-CM | POA: Diagnosis not present

## 2022-05-30 DIAGNOSIS — R1032 Left lower quadrant pain: Secondary | ICD-10-CM | POA: Diagnosis not present

## 2022-05-30 DIAGNOSIS — R1030 Lower abdominal pain, unspecified: Secondary | ICD-10-CM | POA: Diagnosis present

## 2022-05-30 DIAGNOSIS — N433 Hydrocele, unspecified: Secondary | ICD-10-CM | POA: Diagnosis not present

## 2022-05-30 DIAGNOSIS — R109 Unspecified abdominal pain: Secondary | ICD-10-CM

## 2022-05-30 DIAGNOSIS — N1330 Unspecified hydronephrosis: Secondary | ICD-10-CM | POA: Diagnosis not present

## 2022-05-30 DIAGNOSIS — N503 Cyst of epididymis: Secondary | ICD-10-CM | POA: Diagnosis not present

## 2022-05-30 DIAGNOSIS — N281 Cyst of kidney, acquired: Secondary | ICD-10-CM | POA: Diagnosis not present

## 2022-05-30 DIAGNOSIS — N3289 Other specified disorders of bladder: Secondary | ICD-10-CM | POA: Diagnosis not present

## 2022-05-30 LAB — URINALYSIS, ROUTINE W REFLEX MICROSCOPIC

## 2022-05-30 LAB — CBC WITH DIFFERENTIAL/PLATELET
Abs Immature Granulocytes: 0.02 10*3/uL (ref 0.00–0.07)
Basophils Absolute: 0 10*3/uL (ref 0.0–0.1)
Basophils Relative: 0 %
Eosinophils Absolute: 0 10*3/uL (ref 0.0–0.5)
Eosinophils Relative: 0 %
HCT: 37 % — ABNORMAL LOW (ref 39.0–52.0)
Hemoglobin: 11.9 g/dL — ABNORMAL LOW (ref 13.0–17.0)
Immature Granulocytes: 0 %
Lymphocytes Relative: 4 %
Lymphs Abs: 0.3 10*3/uL — ABNORMAL LOW (ref 0.7–4.0)
MCH: 29.7 pg (ref 26.0–34.0)
MCHC: 32.2 g/dL (ref 30.0–36.0)
MCV: 92.3 fL (ref 80.0–100.0)
Monocytes Absolute: 0.5 10*3/uL (ref 0.1–1.0)
Monocytes Relative: 5 %
Neutro Abs: 7.9 10*3/uL — ABNORMAL HIGH (ref 1.7–7.7)
Neutrophils Relative %: 91 %
Platelets: 231 10*3/uL (ref 150–400)
RBC: 4.01 MIL/uL — ABNORMAL LOW (ref 4.22–5.81)
RDW: 11.9 % (ref 11.5–15.5)
WBC: 8.7 10*3/uL (ref 4.0–10.5)
nRBC: 0 % (ref 0.0–0.2)

## 2022-05-30 LAB — BASIC METABOLIC PANEL
Anion gap: 8 (ref 5–15)
BUN: 24 mg/dL — ABNORMAL HIGH (ref 8–23)
CO2: 24 mmol/L (ref 22–32)
Calcium: 8.8 mg/dL — ABNORMAL LOW (ref 8.9–10.3)
Chloride: 106 mmol/L (ref 98–111)
Creatinine, Ser: 0.91 mg/dL (ref 0.61–1.24)
GFR, Estimated: >60 mL/min (ref >60)
Glucose, Bld: 140 mg/dL — ABNORMAL HIGH (ref 70–99)
Potassium: 4.2 mmol/L (ref 3.5–5.1)
Sodium: 138 mmol/L (ref 135–145)

## 2022-05-30 LAB — URINALYSIS, MICROSCOPIC (REFLEX)
RBC / HPF: >50 RBC/hpf (ref 0–5)
Squamous Epithelial / HPF: NONE SEEN (ref 0–5)

## 2022-05-30 MED ORDER — HYDROCODONE-ACETAMINOPHEN 5-325 MG PO TABS
1.0000 | ORAL_TABLET | Freq: Once | ORAL | Status: AC
  Administered 2022-05-30: 1 via ORAL
  Filled 2022-05-30: qty 1

## 2022-05-30 MED ORDER — CIPROFLOXACIN HCL 500 MG PO TABS: 500.0000 mg | ORAL_TABLET | Freq: Two times a day (BID) | ORAL | 0 refills | Status: DC

## 2022-05-30 NOTE — ED Provider Notes (Signed)
[facility] DEPT Provider Note   CSN: [id number] Arrival date & time: 05/30/22  1214     History {Add pertinent medical, surgical, social history, OB history to HPI:1} Chief Complaint  Patient presents with   Hematuria   Flank Pain    Anthony Sheppard is a 86 y.o. male.  He has a history of hypertension and prostate disease diabetes.  He is here with a complaint of hematuria and flank pain low abdominal pain that started this morning.  He said he has had this before.  Never been worked up.  He does endorse that he sees a urologist through the [facility] [place] and also has seen [facility] in the past for his prostatic enlargement.  No clear prostate cancer.  He denies any fevers or chills.  He was nauseous earlier had 1 episode of vomiting.  No testicular pain or swelling.  No rectal bleeding.  Not on blood thinners.  The history is provided by the patient.  Hematuria This is a recurrent problem. The current episode started 6 to 12 hours ago. The problem occurs hourly. The problem has not changed since onset.Associated symptoms include abdominal pain. Pertinent negatives include no chest pain, no headaches and no shortness of breath. Nothing aggravates the symptoms. Nothing relieves the symptoms. He has tried nothing for the symptoms. The treatment provided no relief.  Flank Pain Associated symptoms include abdominal pain. Pertinent negatives include no chest pain, no headaches and no shortness of breath.       Home Medications Prior to Admission medications   Medication Sig Start Date End Date Taking? Authorizing Provider  acetaminophen-codeine 120-12 MG/5ML solution Take 10 mLs by mouth every 4 (four) hours as needed for moderate pain. 02/03/17   Lawyer, Harrell Gave, PA-C  cetirizine (ZYRTEC) 10 MG tablet Take 1 tablet by mouth at bedtime. 01/11/22   [provider]  Chlorpheniramine Maleate (ALLERGY PO) Take 1 tablet by mouth daily as needed (allergies).     [provider]  finasteride (PROSCAR) 5 MG tablet Take 5 mg by mouth at bedtime.    [provider]  Guaifenesin 1200 MG TB12 Take 1 tablet (1,200 mg total) by mouth 2 (two) times daily. 02/03/17   Lawyer, Harrell Gave, PA-C  ibuprofen (ADVIL,MOTRIN) 800 MG tablet Take 1 tablet (800 mg total) by mouth every 8 (eight) hours as needed. 02/03/17   Lawyer, Harrell Gave, PA-C  methocarbamol (ROBAXIN) 500 MG tablet Take 1 tablet (500 mg total) by mouth 2 (two) times daily. 06/27/20   Lacretia Leigh, MD  simethicone (MYLICON) 80 MG chewable tablet CHEW TWO TABLETS BY MOUTH 3 TIMES A DAY AS NEEDED FOR GAS 01/11/22   [provider]  tamsulosin (FLOMAX) 0.4 MG CAPS capsule Take 0.4 mg by mouth at bedtime.     [provider]      Allergies    Aspirin    Review of Systems   Review of Systems  Constitutional:  Negative for fever.  HENT:  Negative for sore throat.   Respiratory:  Negative for shortness of breath.   Cardiovascular:  Negative for chest pain.  Gastrointestinal:  Positive for abdominal pain, nausea and vomiting. Negative for blood in stool.  Genitourinary:  Positive for flank pain and hematuria. Negative for dysuria.  Musculoskeletal:  Positive for back pain.  Skin:  Negative for rash.  Neurological:  Negative for headaches.    Physical Exam Updated Vital Signs BP 137/71   Pulse 86   Temp 98 F (36.7 C) (Oral)  Resp 16   SpO2 97%  Physical Exam Vitals and nursing note reviewed.  Constitutional:      General: He is not in acute distress.    Appearance: Normal appearance. He is well-developed.  HENT:     Head: Normocephalic and atraumatic.  Eyes:     Conjunctiva/sclera: Conjunctivae normal.  Cardiovascular:     Rate and Rhythm: Normal rate and regular rhythm.     Heart sounds: No murmur heard. Pulmonary:     Effort: Pulmonary effort is normal. No respiratory distress.     Breath sounds: Normal breath sounds.  Abdominal:     Palpations:  Abdomen is soft.     Tenderness: There is no abdominal tenderness. There is no guarding or rebound.  Musculoskeletal:     Cervical back: Neck supple.     Right lower leg: No edema.     Left lower leg: No edema.  Skin:    General: Skin is warm and dry.     Capillary Refill: Capillary refill takes less than 2 seconds.  Neurological:     General: No focal deficit present.     Mental Status: He is alert.     Gait: Gait normal.     ED Results / Procedures / Treatments   Labs (all labs ordered are listed, but only abnormal results are displayed) Labs Reviewed  CBC WITH DIFFERENTIAL/PLATELET - Abnormal; Notable for the following components:      Result Value   RBC 4.01 (*)    Hemoglobin 11.9 (*)    HCT 37.0 (*)    Neutro Abs 7.9 (*)    Lymphs Abs 0.3 (*)    All other components within normal limits  BASIC METABOLIC PANEL - Abnormal; Notable for the following components:   Glucose, Bld 140 (*)    BUN 24 (*)    Calcium 8.8 (*)    All other components within normal limits  URINALYSIS, ROUTINE W REFLEX MICROSCOPIC - Abnormal; Notable for the following components:   Color, Urine RED (*)    APPearance TURBID (*)    Glucose, UA   (*)    Value: TEST NOT REPORTED DUE TO COLOR INTERFERENCE OF URINE PIGMENT   Hgb urine dipstick   (*)    Value: TEST NOT REPORTED DUE TO COLOR INTERFERENCE OF URINE PIGMENT   Bilirubin Urine   (*)    Value: TEST NOT REPORTED DUE TO COLOR INTERFERENCE OF URINE PIGMENT   Ketones, ur   (*)    Value: TEST NOT REPORTED DUE TO COLOR INTERFERENCE OF URINE PIGMENT   Protein, ur   (*)    Value: TEST NOT REPORTED DUE TO COLOR INTERFERENCE OF URINE PIGMENT   Nitrite   (*)    Value: TEST NOT REPORTED DUE TO COLOR INTERFERENCE OF URINE PIGMENT   Leukocytes,Ua   (*)    Value: TEST NOT REPORTED DUE TO COLOR INTERFERENCE OF URINE PIGMENT   All other components within normal limits  URINALYSIS, MICROSCOPIC (REFLEX) - Abnormal; Notable for the following components:    Bacteria, UA MANY (*)    All other components within normal limits    EKG None  Radiology US SCROTUM W/DOPPLER  Result Date: 05/30/2022 CLINICAL DATA:  Scrotal pain EXAM: SCROTAL ULTRASOUND DOPPLER ULTRASOUND OF THE TESTICLES TECHNIQUE: Complete ultrasound examination of the testicles, epididymis, and other scrotal structures was performed. Color and spectral Doppler ultrasound were also utilized to evaluate blood flow to the testicles. COMPARISON:  None Available. FINDINGS: Right testicle Measurements:  4.1 x 2 x 3.6 cm. No mass or microlithiasis visualized. Left testicle Measurements: 4 x 1.9 x 3 cm. No mass or microlithiasis visualized. Right epididymis: There are multiple cysts in the right epididymis each measuring less than 7 mm in size. Internal echoes and internal septations are seen in the some of these epididymal cysts. Left epididymis:  There is 5 mm complex cyst in the left epididymis. Hydrocele:  Small to moderate bilateral hydrocele is present. Varicocele:  None visualized. Pulsed Doppler interrogation of both testes demonstrates normal low resistance arterial and venous waveforms bilaterally. IMPRESSION: There is no evidence of testicular torsion. There is homogeneous echogenicity in both testes. Epididymal cysts are noted on both sides, more so on the right side. Small to moderate bilateral hydrocele. Electronically Signed   By: Elmer Picker M.D.   On: 05/30/2022 14:00    Procedures Procedures  {Document cardiac monitor, telemetry assessment procedure when appropriate:1}  Medications Ordered in ED Medications  HYDROcodone-acetaminophen (NORCO/VICODIN) 5-325 MG per tablet 1 tablet (1 tablet Oral Given 05/30/22 1342)    ED Course/ Medical Decision Making/ A&P                           Medical Decision Making Amount and/or Complexity of Data Reviewed Radiology: ordered.   ***  {Document critical care time when appropriate:1} {Document review of labs and clinical  decision tools ie heart score, Chads2Vasc2 etc:1}  {Document your independent review of radiology images, and any outside records:1} {Document your discussion with family members, caretakers, and with consultants:1} {Document social determinants of health affecting pt's care:1} {Document your decision making why or why not admission, treatments were needed:1} Final Clinical Impression(s) / ED Diagnoses Final diagnoses:  None    Rx / DC Orders ED Discharge Orders     None

## 2022-05-30 NOTE — ED Notes (Signed)
Lab called to add on urine culture.  ?

## 2022-05-30 NOTE — ED Notes (Signed)
Discharge paperwork reviewed and given to pt. And wife. Both showed understanding of discharge information and had no further questions for ED staff. Pt. Ambulated out of the ED with his wife.

## 2022-05-30 NOTE — ED Notes (Signed)
Pt in CT SCAN will get vitals on return 

## 2022-05-30 NOTE — Discharge Instructions (Addendum)
You were seen in the emergency department for blood in your urine and flank and abdominal pain.  Your CAT scan did not show any obvious kidney stones but there was signs of a mass on the left kidney that will need follow-up.  We are putting you on antibiotics for possible infection.  Please drink plenty of fluids.  Follow-up with your urologist at the [facility] or contact [facility].  Return to the emergency department if any signs of obstruction or any other concerns.

## 2022-05-30 NOTE — ED Triage Notes (Signed)
Pt reports pain in kidneys and blood in urine today. Hx dementia. Family at bedside.

## 2022-05-30 NOTE — ED Provider Triage Note (Signed)
Emergency Medicine Provider Triage Evaluation Note  Anthony Sheppard , a 86 y.o. male  was evaluated in triage.  Pt complains of scrotal pain and pink-tinged urine that started today.  Patient also having suprapubic abdominal pain.  No fevers or chills.  He says pain radiates up to the kidneys bilaterally.  Patient did have some nausea and vomiting earlier but no diarrhea.  Review of Systems  Positive:  Negative: See above   Physical Exam  BP 137/71   Pulse 86   Temp 98 F (36.7 C) (Oral)   Resp 16   SpO2 97%  Gen:   Awake, no distress   Resp:  Normal effort  MSK:   Moves extremities without difficulty  Other:    Medical Decision Making  Medically screening exam initiated at 12:49 PM.  Appropriate orders placed.  Anthony Sheppard was informed that the remainder of the evaluation will be completed by another provider, this initial triage assessment does not replace that evaluation, and the importance of remaining in the ED until their evaluation is complete.     Anthony Sheppard [place], [place] 05/30/22 1252

## 2022-06-01 LAB — URINE CULTURE: Culture: NO GROWTH

## 2022-06-03 DIAGNOSIS — R351 Nocturia: Secondary | ICD-10-CM | POA: Diagnosis not present

## 2022-06-03 DIAGNOSIS — R310 Gross hematuria: Secondary | ICD-10-CM | POA: Diagnosis not present

## 2022-06-03 DIAGNOSIS — N401 Enlarged prostate with lower urinary tract symptoms: Secondary | ICD-10-CM | POA: Diagnosis not present

## 2022-06-20 ENCOUNTER — Emergency Department (HOSPITAL_COMMUNITY)
Admission: EM | Admit: 2022-06-20 | Discharge: 2022-06-20 | Disposition: A | Discharge status: Home / Self Care | Payer: No Typology Code available for payment source | Attending: Emergency Medicine | Admitting: Emergency Medicine

## 2022-06-20 ENCOUNTER — Encounter (HOSPITAL_COMMUNITY): Payer: Self-pay | Admitting: Emergency Medicine

## 2022-06-20 DIAGNOSIS — R319 Hematuria, unspecified: Secondary | ICD-10-CM | POA: Diagnosis present

## 2022-06-20 DIAGNOSIS — R310 Gross hematuria: Secondary | ICD-10-CM | POA: Insufficient documentation

## 2022-06-20 LAB — CBC WITH DIFFERENTIAL/PLATELET
Abs Immature Granulocytes: 0.01 10*3/uL (ref 0.00–0.07)
Basophils Absolute: 0 10*3/uL (ref 0.0–0.1)
Basophils Relative: 0 %
Eosinophils Absolute: 0.3 10*3/uL (ref 0.0–0.5)
Eosinophils Relative: 5 %
HCT: 34.7 % — ABNORMAL LOW (ref 39.0–52.0)
Hemoglobin: 10.9 g/dL — ABNORMAL LOW (ref 13.0–17.0)
Immature Granulocytes: 0 %
Lymphocytes Relative: 17 %
Lymphs Abs: 0.9 10*3/uL (ref 0.7–4.0)
MCH: 29.4 pg (ref 26.0–34.0)
MCHC: 31.4 g/dL (ref 30.0–36.0)
MCV: 93.5 fL (ref 80.0–100.0)
Monocytes Absolute: 0.7 10*3/uL (ref 0.1–1.0)
Monocytes Relative: 13 %
Neutro Abs: 3.6 10*3/uL (ref 1.7–7.7)
Neutrophils Relative %: 65 %
Platelets: 258 10*3/uL (ref 150–400)
RBC: 3.71 MIL/uL — ABNORMAL LOW (ref 4.22–5.81)
RDW: 12.3 % (ref 11.5–15.5)
WBC: 5.5 10*3/uL (ref 4.0–10.5)
nRBC: 0 % (ref 0.0–0.2)

## 2022-06-20 LAB — URINALYSIS, ROUTINE W REFLEX MICROSCOPIC
Bilirubin Urine: NEGATIVE
Glucose, UA: NEGATIVE mg/dL
Ketones, ur: NEGATIVE mg/dL
Leukocytes,Ua: NEGATIVE
Nitrite: NEGATIVE
Protein, ur: NEGATIVE mg/dL
RBC / HPF: >50 RBC/hpf — ABNORMAL HIGH (ref 0–5)
Specific Gravity, Urine: 1.014 (ref 1.005–1.030)
pH: 5 (ref 5.0–8.0)

## 2022-06-20 LAB — BASIC METABOLIC PANEL
Anion gap: 8 (ref 5–15)
BUN: 18 mg/dL (ref 8–23)
CO2: 26 mmol/L (ref 22–32)
Calcium: 8.8 mg/dL — ABNORMAL LOW (ref 8.9–10.3)
Chloride: 107 mmol/L (ref 98–111)
Creatinine, Ser: 0.86 mg/dL (ref 0.61–1.24)
GFR, Estimated: >60 mL/min (ref >60)
Glucose, Bld: 125 mg/dL — ABNORMAL HIGH (ref 70–99)
Potassium: 4.1 mmol/L (ref 3.5–5.1)
Sodium: 141 mmol/L (ref 135–145)

## 2022-06-20 MED ORDER — CIPROFLOXACIN HCL 500 MG PO TABS: 500.0000 mg | ORAL_TABLET | Freq: Two times a day (BID) | ORAL | 0 refills | Status: DC

## 2022-06-20 NOTE — Discharge Instructions (Signed)
You were seen in the emergency department for bloody urine.  Your lab work showed you to be slightly more anemic.  Your kidney function was okay.  You requested antibiotics for possible infection.  It will be important for you to follow-up with your urologist as scheduled.  Drink plenty of fluids.  Return if any worsening or concerning symptoms.

## 2022-06-20 NOTE — ED Triage Notes (Signed)
Patient c/o hematuria intermittently x6 days. States seen for same previously with mass on kidney. Denies pain.

## 2022-06-20 NOTE — ED Provider Notes (Signed)
[facility] DEPT Provider Note   CSN: [id number] Arrival date & time: 06/20/22  1449     History  Chief Complaint  Patient presents with   Hematuria    Anthony Sheppard is a 86 y.o. male.  He is here for evaluation of a episode of painless hematuria that started yesterday around 10 AM and lasted till 4 PM.  He said he had 2 or 3 episodes.  Since then he has been urinating clear yellow.  It was not associated with any back pain or abdominal pain, no fevers or chills.  He was seen for this a few weeks ago by myself and had lab work and CT imaging at that time.  He has since followed up with urology and is scheduled to get a cystoscopy.  He currently has no complaints today.  He is not on blood thinners  The history is provided by the patient and a relative.  Hematuria This is a recurrent problem. The current episode started yesterday. The problem has been resolved. Pertinent negatives include no chest pain, no abdominal pain, no headaches and no shortness of breath. Nothing aggravates the symptoms. Nothing relieves the symptoms. He has tried rest for the symptoms. The treatment provided significant relief.       Home Medications Prior to Admission medications   Medication Sig Start Date End Date Taking? Authorizing Provider  acetaminophen-codeine 120-12 MG/5ML solution Take 10 mLs by mouth every 4 (four) hours as needed for moderate pain. 02/03/17   Lawyer, Harrell Gave, PA-C  cetirizine (ZYRTEC) 10 MG tablet Take 1 tablet by mouth at bedtime. 01/11/22   [provider]  Chlorpheniramine Maleate (ALLERGY PO) Take 1 tablet by mouth daily as needed (allergies).    [provider]  ciprofloxacin (CIPRO) 500 MG tablet Take 1 tablet (500 mg total) by mouth 2 (two) times daily. 05/30/22   Hayden Rasmussen, MD  finasteride (PROSCAR) 5 MG tablet Take 5 mg by mouth at bedtime.    [provider]  Guaifenesin 1200 MG TB12 Take 1 tablet (1,200 mg  total) by mouth 2 (two) times daily. 02/03/17   Lawyer, Harrell Gave, PA-C  ibuprofen (ADVIL,MOTRIN) 800 MG tablet Take 1 tablet (800 mg total) by mouth every 8 (eight) hours as needed. 02/03/17   Lawyer, Harrell Gave, PA-C  methocarbamol (ROBAXIN) 500 MG tablet Take 1 tablet (500 mg total) by mouth 2 (two) times daily. 06/27/20   Lacretia Leigh, MD  simethicone (MYLICON) 80 MG chewable tablet CHEW TWO TABLETS BY MOUTH 3 TIMES A DAY AS NEEDED FOR GAS 01/11/22   [provider]  tamsulosin (FLOMAX) 0.4 MG CAPS capsule Take 0.4 mg by mouth at bedtime.     [provider]      Allergies    Aspirin    Review of Systems   Review of Systems  Constitutional:  Negative for fever.  Respiratory:  Negative for shortness of breath.   Cardiovascular:  Negative for chest pain.  Gastrointestinal:  Negative for abdominal pain.  Genitourinary:  Positive for hematuria. Negative for dysuria.  Musculoskeletal:  Negative for back pain.  Neurological:  Negative for headaches.    Physical Exam Updated Vital Signs BP (!) 147/67   Pulse 64   Temp 98.3 F (36.8 C) (Oral)   Resp 18   SpO2 99%  Physical Exam Vitals and nursing note reviewed.  Constitutional:      General: He is not in acute distress.    Appearance: Normal appearance. He is  well-developed.  HENT:     Head: Normocephalic and atraumatic.  Eyes:     Conjunctiva/sclera: Conjunctivae normal.  Cardiovascular:     Rate and Rhythm: Normal rate and regular rhythm.     Heart sounds: No murmur heard. Pulmonary:     Effort: Pulmonary effort is normal. No respiratory distress.     Breath sounds: Normal breath sounds.  Abdominal:     Palpations: Abdomen is soft.     Tenderness: There is no abdominal tenderness. There is no guarding or rebound.  Musculoskeletal:        General: No swelling.     Cervical back: Neck supple.  Skin:    General: Skin is warm and dry.     Capillary Refill: Capillary refill takes less than 2 seconds.   Neurological:     General: No focal deficit present.     Mental Status: He is alert.     ED Results / Procedures / Treatments   Labs (all labs ordered are listed, but only abnormal results are displayed) Labs Reviewed  URINALYSIS, ROUTINE W REFLEX MICROSCOPIC - Abnormal; Notable for the following components:      Result Value   APPearance HAZY (*)    Hgb urine dipstick LARGE (*)    RBC / HPF >50 (*)    Bacteria, UA RARE (*)    All other components within normal limits  BASIC METABOLIC PANEL - Abnormal; Notable for the following components:   Glucose, Bld 125 (*)    Calcium 8.8 (*)    All other components within normal limits  CBC WITH DIFFERENTIAL/PLATELET - Abnormal; Notable for the following components:   RBC 3.71 (*)    Hemoglobin 10.9 (*)    HCT 34.7 (*)    All other components within normal limits    EKG None  Radiology No results found.  Procedures Procedures    Medications Ordered in ED Medications - No data to display  ED Course/ Medical Decision Making/ A&P Clinical Course as of 06/21/22 1123  Sun Jun 20, 2022  1939 Urine culture during his last visit not currently anything.  Daughter does state he improved on the antibiotics. [MB]  1940 Hemoglobin trending down from prior.  He is passing yellow urine now although still has red blood cells in it and is likely been having ongoing microscopic hematuria since last seen.  Otherwise appears well and does not need transfusion. [MB]  2041 Reviewed results of work-up with patient and daughter.  She would like him to go back on antibiotics and that seemed to help before.  We will send prescription after pharmacy.  They understand to follow-up with urology as scheduled.  Return instructions discussed [MB]    Clinical Course User Index [MB] Hayden Rasmussen, MD                           Medical Decision Making Amount and/or Complexity of Data Reviewed Labs: ordered.  Risk Prescription drug management.  This  patient complains of painless hematuria; this involves an extensive number of treatment Options and is a complaint that carries with it a high risk of complications and morbidity. The differential includes hematuria, glomerulonephritis, bladder cancer, renal colic, UTI  I ordered, reviewed and interpreted labs, which included CBC with normal white count, hemoglobin trending down from priors, chemistries normal with normal renal function, urinalysis greater than 50 red blood cells, 6-10 whites rare bacteria nitrite negative  Additional history obtained  from patient's daughter Previous records obtained and reviewed in epic including recent ED visit  Cardiac monitoring reviewed, normal sinus rhythm Social determinants considered, no significant barriers Critical Interventions: None  After the interventions stated above, I reevaluated the patient and found patient to be asymptomatic Admission and further testing considered, no indications for admission or further work-up at this time.  Patient has ongoing investigation by his doctors at the [place].  Return instructions discussed.          Final Clinical Impression(s) / ED Diagnoses Final diagnoses:  Gross hematuria    Rx / DC Orders ED Discharge Orders          Ordered    ciprofloxacin (CIPRO) 500 MG tablet  2 times daily        06/20/22 2041              Hayden Rasmussen, MD 06/21/22 1124

## 2022-06-25 ENCOUNTER — Ambulatory Visit (INDEPENDENT_AMBULATORY_CARE_PROVIDER_SITE_OTHER): Payer: No Typology Code available for payment source | Admitting: Podiatry

## 2022-06-25 ENCOUNTER — Encounter: Payer: Self-pay | Admitting: Podiatry

## 2022-06-25 DIAGNOSIS — B351 Tinea unguium: Secondary | ICD-10-CM | POA: Diagnosis not present

## 2022-06-25 DIAGNOSIS — M79675 Pain in left toe(s): Secondary | ICD-10-CM | POA: Diagnosis not present

## 2022-06-25 DIAGNOSIS — M79674 Pain in right toe(s): Secondary | ICD-10-CM

## 2022-06-26 NOTE — Progress Notes (Signed)
Subjective:   Patient ID: Anthony Sheppard, male   DOB: 86 y.o.   MRN: [id number]   HPI Patient presents with caregiver with inability to take care of toenails stated that they have been getting sore and elongated and they are painful when pressed   ROS      Objective:  Physical Exam  Neurovascular status intact negative Homans' sign noted thick yellow brittle nailbeds 1-5 both feet painful when pressed     Assessment:  Mycotic nail infection with pain 1-5 both feet     Plan:  Debridement painful nailbeds 1-5 both feet no angiogenic bleeding noted

## 2022-08-02 DIAGNOSIS — R319 Hematuria, unspecified: Secondary | ICD-10-CM | POA: Diagnosis not present

## 2022-08-02 DIAGNOSIS — R310 Gross hematuria: Secondary | ICD-10-CM | POA: Diagnosis not present

## 2022-08-02 DIAGNOSIS — N289 Disorder of kidney and ureter, unspecified: Secondary | ICD-10-CM | POA: Diagnosis not present

## 2022-08-02 DIAGNOSIS — N3289 Other specified disorders of bladder: Secondary | ICD-10-CM | POA: Diagnosis not present

## 2022-08-02 DIAGNOSIS — N400 Enlarged prostate without lower urinary tract symptoms: Secondary | ICD-10-CM | POA: Diagnosis not present

## 2022-08-09 ENCOUNTER — Other Ambulatory Visit: Payer: Self-pay | Admitting: Urology

## 2022-08-09 ENCOUNTER — Other Ambulatory Visit (HOSPITAL_COMMUNITY): Payer: Self-pay | Admitting: Urology

## 2022-08-09 DIAGNOSIS — D4102 Neoplasm of uncertain behavior of left kidney: Secondary | ICD-10-CM

## 2022-08-10 NOTE — Progress Notes (Unsigned)
Anthony Cleveland, MD  Anthony Sheppard   CT core LUP renal mass   [facility]

## 2022-08-16 DIAGNOSIS — R310 Gross hematuria: Secondary | ICD-10-CM | POA: Diagnosis not present

## 2022-08-18 ENCOUNTER — Telehealth: Payer: Self-pay | Admitting: Oncology

## 2022-08-18 NOTE — Telephone Encounter (Signed)
Scheduled appt per 8/30 referral. Pt's daughter is aware of appt date and time. Pt's daughter is aware to arrive 15 mins prior to appt time and to bring and updated insurance card. Pt's daughter is aware of appt location.

## 2022-08-26 ENCOUNTER — Other Ambulatory Visit: Payer: Self-pay | Admitting: Radiology

## 2022-08-26 DIAGNOSIS — N2889 Other specified disorders of kidney and ureter: Secondary | ICD-10-CM

## 2022-08-26 NOTE — H&P (Signed)
Chief Complaint: Patient was seen in consultation today for left renal mass at the request of Gay,Matthew R  Referring Physician(s): Gay,Matthew R  Supervising Physician: Michaelle Birks  Patient Status: [facility] - Out-pt  History of Present Illness: Anthony Sheppard is a 86 y.o. male presented to ED c/o hematuria and bilateral flank pain. CT renal stone scan at that time revealed left renal mass. Pt was referred to urology for workup. Pt has been referred to IR by Dr. Abner Greenspan for left renal mass biopsy. Dr. Vernard Gambles approved CT core LUP renal mass biopsy.   Past Medical History:  Diagnosis Date   Diabetes mellitus    Enlarged prostate    Hypertension    Sleep apnea     No past surgical history on file.  Allergies: Aspirin  Medications: Prior to Admission medications   Medication Sig Start Date End Date Taking? Authorizing Provider  acetaminophen-codeine 120-12 MG/5ML solution Take 10 mLs by mouth every 4 (four) hours as needed for moderate pain. Patient not taking: Reported on 06/20/2022 02/03/17   Dalia Heading, PA-C  ADVIL 200 MG CAPS Take 400 mg by mouth every 6 (six) hours as needed (for mild pain or headaches).    [provider]  ALEVE 220 MG tablet Take 220-440 mg by mouth 2 (two) times daily as needed (for mild pain or headaches).    [provider]  cetirizine (ZYRTEC) 10 MG tablet Take 10 mg by mouth at bedtime. 01/11/22   [provider]  ciprofloxacin (CIPRO) 500 MG tablet Take 1 tablet (500 mg total) by mouth 2 (two) times daily. 06/20/22   Hayden Rasmussen, MD  finasteride (PROSCAR) 5 MG tablet Take 5 mg by mouth at bedtime.    [provider]  Guaifenesin 1200 MG TB12 Take 1 tablet (1,200 mg total) by mouth 2 (two) times daily. Patient not taking: Reported on 06/20/2022 02/03/17   Dalia Heading, PA-C  ibuprofen (ADVIL,MOTRIN) 800 MG tablet Take 1 tablet (800 mg total) by mouth every 8 (eight) hours as needed. Patient not taking:  Reported on 06/20/2022 02/03/17   Dalia Heading, PA-C  methocarbamol (ROBAXIN) 500 MG tablet Take 1 tablet (500 mg total) by mouth 2 (two) times daily. Patient not taking: Reported on 06/20/2022 06/27/20   Lacretia Leigh, MD  simethicone [employer]) 80 MG chewable tablet Chew 160 mg by mouth 3 (three) times daily as needed for flatulence. 01/11/22   [provider]  tamsulosin (FLOMAX) 0.4 MG CAPS capsule Take 0.4 mg by mouth at bedtime.     [provider]     No family history on file.  Social History   Socioeconomic History   Marital status: Married    Spouse name: Not on file   Number of children: Not on file   Years of education: Not on file   Highest education level: Not on file  Occupational History   Not on file  Tobacco Use   Smoking status: Never   Smokeless tobacco: Never  Substance and Sexual Activity   Alcohol use: No   Drug use: No   Sexual activity: Not on file  Other Topics Concern   Not on file  Social History Narrative   Not on file   Social Determinants of Health   Financial Resource Strain: Not on file  Food Insecurity: Not on file  Transportation Needs: Not on file  Physical Activity: Not on file  Stress: Not on file  Social Connections: Not on file  ECOG Status: {CHL ONC ECOG IP:[id number]}  Review of Systems: A 12 point ROS discussed and pertinent positives are indicated in the HPI above.  All other systems are negative.  Review of Systems  Vital Signs: There were no vitals taken for this visit.  Advance Care Plan: {Advance Care BHAL:93790}    Physical Exam  Imaging: No results found.  Labs:  CBC: Recent Labs    05/30/22 1300 06/20/22 1856  WBC 8.7 5.5  HGB 11.9* 10.9*  HCT 37.0* 34.7*  PLT 231 258    COAGS: No results for input(s): "INR", "APTT" in the last 8760 hours.  BMP: Recent Labs    05/30/22 1300 06/20/22 1856  NA 138 141  K 4.2 4.1  CL 106 107  CO2 24 26  GLUCOSE 140* 125*  BUN 24* 18   CALCIUM 8.8* 8.8*  CREATININE 0.91 0.86  GFRNONAA >60 >60    LIVER FUNCTION TESTS: No results for input(s): "BILITOT", "AST", "ALT", "ALKPHOS", "PROT", "ALBUMIN" in the last 8760 hours.  TUMOR MARKERS: No results for input(s): "AFPTM", "CEA", "CA199", "CHROMGRNA" in the last 8760 hours.  Assessment and Plan:  Risks and benefits of left renal lesion biopsy with moderate sedation was discussed with the patient and/or patient's family including, but not limited to bleeding, infection, damage to adjacent structures or low yield requiring additional tests.  All of the questions were answered and there is agreement to proceed.  Consent signed and in chart.   Thank you for this interesting consult.  I greatly enjoyed meeting Anthony Sheppard and look forward to participating in their care.  A copy of this report was sent to the requesting provider on this date.  Electronically Signed: Tyson Alias, NP 08/26/2022, 4:30 PM   I spent a total of {New WIOX:[id number]} {New Out-Pt:[id number]}  {Established Out-Pt:304952003} in face to face in clinical consultation, greater than 50% of which was counseling/coordinating care for ***

## 2022-08-29 ENCOUNTER — Other Ambulatory Visit: Payer: Self-pay | Admitting: Student

## 2022-08-30 ENCOUNTER — Ambulatory Visit (HOSPITAL_COMMUNITY)
Admission: RE | Admit: 2022-08-30 | Discharge: 2022-08-30 | Disposition: A | Discharge status: Home / Self Care | Payer: Medicare Other | Source: Ambulatory Visit | Attending: Urology | Admitting: Urology

## 2022-08-30 ENCOUNTER — Encounter (HOSPITAL_COMMUNITY): Payer: Self-pay

## 2022-08-30 DIAGNOSIS — R319 Hematuria, unspecified: Secondary | ICD-10-CM | POA: Diagnosis not present

## 2022-08-30 DIAGNOSIS — D4102 Neoplasm of uncertain behavior of left kidney: Secondary | ICD-10-CM | POA: Diagnosis present

## 2022-08-30 DIAGNOSIS — R109 Unspecified abdominal pain: Secondary | ICD-10-CM | POA: Diagnosis not present

## 2022-08-30 DIAGNOSIS — N2889 Other specified disorders of kidney and ureter: Secondary | ICD-10-CM | POA: Insufficient documentation

## 2022-08-30 DIAGNOSIS — E119 Type 2 diabetes mellitus without complications: Secondary | ICD-10-CM | POA: Insufficient documentation

## 2022-08-30 LAB — PROTIME-INR
INR: 1.2 (ref 0.8–1.2)
Prothrombin Time: 14.7 seconds (ref 11.4–15.2)

## 2022-08-30 LAB — CBC
HCT: 32.9 % — ABNORMAL LOW (ref 39.0–52.0)
Hemoglobin: 10.2 g/dL — ABNORMAL LOW (ref 13.0–17.0)
MCH: 28.9 pg (ref 26.0–34.0)
MCHC: 31 g/dL (ref 30.0–36.0)
MCV: 93.2 fL (ref 80.0–100.0)
Platelets: 216 10*3/uL (ref 150–400)
RBC: 3.53 MIL/uL — ABNORMAL LOW (ref 4.22–5.81)
RDW: 12.9 % (ref 11.5–15.5)
WBC: 6.3 10*3/uL (ref 4.0–10.5)
nRBC: 0 % (ref 0.0–0.2)

## 2022-08-30 LAB — GLUCOSE, CAPILLARY: Glucose-Capillary: 74 mg/dL (ref 70–99)

## 2022-08-30 MED ORDER — SODIUM CHLORIDE 0.9 % IV SOLN: INTRAVENOUS | Status: DC

## 2022-08-30 MED ORDER — HYDRALAZINE HCL 20 MG/ML IJ SOLN
INTRAMUSCULAR | Status: AC | PRN
  Administered 2022-08-30: 10 mg via INTRAVENOUS

## 2022-08-30 MED ORDER — FENTANYL CITRATE (PF) 100 MCG/2ML IJ SOLN
INTRAMUSCULAR | Status: AC | PRN
  Administered 2022-08-30 (×2): 25 ug via INTRAVENOUS

## 2022-08-30 MED ORDER — LIDOCAINE HCL 1 % IJ SOLN
10.0000 mL | Freq: Once | INTRAMUSCULAR | Status: DC
Start: 2022-08-30 — End: 2022-08-31

## 2022-08-30 MED ORDER — FENTANYL CITRATE (PF) 100 MCG/2ML IJ SOLN
INTRAMUSCULAR | Status: AC
  Filled 2022-08-30: qty 2

## 2022-08-30 MED ORDER — MIDAZOLAM HCL 2 MG/2ML IJ SOLN
INTRAMUSCULAR | Status: AC | PRN
  Administered 2022-08-30 (×2): .5 mg via INTRAVENOUS

## 2022-08-30 MED ORDER — OXYCODONE HCL 5 MG PO TABS: 10.0000 mg | ORAL_TABLET | Freq: Four times a day (QID) | ORAL | Status: DC | PRN

## 2022-08-30 MED ORDER — MIDAZOLAM HCL 2 MG/2ML IJ SOLN
INTRAMUSCULAR | Status: AC
  Filled 2022-08-30: qty 2

## 2022-08-30 MED ORDER — GELATIN ABSORBABLE 12-7 MM EX MISC
1.0000 | Freq: Once | CUTANEOUS | Status: AC
Start: 2022-08-30 — End: 2022-08-30
  Administered 2022-08-30: 1 via TOPICAL

## 2022-08-30 MED ORDER — HYDRALAZINE HCL 20 MG/ML IJ SOLN
INTRAMUSCULAR | Status: AC
  Filled 2022-08-30: qty 1

## 2022-08-30 NOTE — Procedures (Signed)
Vascular and Interventional Radiology Procedure Note  Patient: Conan Mcmanaway DOB: 1936-10-24 Medical Record Number: [id number] Note Date/Time: 08/30/22 8:31 AM   Performing Physician: Michaelle Birks, MD Assistant(s): None  Diagnosis: L renal mass  Procedure: LEFT RENAL MASS BIOPSY  Anesthesia: Conscious Sedation Complications: Small L peri-renal hematoma, non-expanding. Pt remained VSS / HDS. Estimated Blood Loss: Minimal Specimens: Sent for Pathology  Findings:  Successful CT-guided biopsy of L renal mass. A total of 3 samples were obtained. Hemostasis of the tract was achieved using Gelfoam Slurry Embolization.  Plan: Bed rest for 2 hours.  See detailed procedure note with images in PACS. The patient tolerated the procedure well without incident or complication and was returned to Recovery in stable condition.    Michaelle Birks, MD Vascular and Interventional Radiology Specialists [facility] Radiology   Pager. [place]

## 2022-09-01 LAB — SURGICAL PATHOLOGY

## 2022-09-07 ENCOUNTER — Other Ambulatory Visit: Payer: Self-pay

## 2022-09-07 ENCOUNTER — Telehealth: Payer: Self-pay | Admitting: Oncology

## 2022-09-07 ENCOUNTER — Inpatient Hospital Stay: Payer: Medicare Other | Attending: Oncology | Admitting: Oncology

## 2022-09-07 VITALS — BP 147/75 | HR 69 | Temp 97.7°F | Resp 16 | Ht 61.0 in | Wt 118.6 lb

## 2022-09-07 DIAGNOSIS — C642 Malignant neoplasm of left kidney, except renal pelvis: Secondary | ICD-10-CM

## 2022-09-07 DIAGNOSIS — R918 Other nonspecific abnormal finding of lung field: Secondary | ICD-10-CM

## 2022-09-07 DIAGNOSIS — C649 Malignant neoplasm of unspecified kidney, except renal pelvis: Secondary | ICD-10-CM | POA: Insufficient documentation

## 2022-09-07 NOTE — Progress Notes (Signed)
START ON PATHWAY REGIMEN - Renal Cell     Cycles 1 through 4: A cycle is every 21 days:     Nivolumab      Ipilimumab    Cycles 5 and beyond: A cycle is every 28 days:     Nivolumab   **Always confirm dose/schedule in your pharmacy ordering system**  Patient Characteristics: Stage IV (Unresected T4M0 or Any T, M1)/Metastatic Disease, Clear Cell, First Line, Intermediate or Poor Risk Therapeutic Status: Stage IV (Unresected T4M0 or Any T, M1)/Metastatic Disease Histology: Clear Cell Line of Therapy: First Line Risk Status: Intermediate Risk Intent of Therapy: Non-Curative / Palliative Intent, Discussed with Patient 

## 2022-09-07 NOTE — Progress Notes (Signed)
Reason for the request:    Kidney cancer  HPI: I was asked by Dr. Abner Greenspan to evaluate Anthony Sheppard for a diagnosis of kidney cancer.  He is an 86 year old man with history of hypertension, diabetes and found to have hematuria in June 2023.  At that time CT scan of the pelvis without contrast showed mild left hydronephrosis and diffuse hyperdensity in the left renal collecting system in addition to circumferential bladder thickening noted.  Pulmonary nodules were also noted at that time.  He was evaluated by Dr. Abner Greenspan and a repeat CT scan on August 02, 2022 showed an infiltrative cystic mass in the left kidney extending into the left renal hilum as well as periaortic adenopathy and pulmonary involvement in addition to T12 pathological fracture.  He underwent kidney biopsy that was completed on 08/30/2022 which showed no clear grade 3 clear-cell renal cell carcinoma.  Clinically, he reports feeling reasonably well without any major complaints.  He has reported left-sided flank pain and intermittent hematuria.  He has reported significant weight loss close to 30 pounds although it is unclear how long it took him to lose that weight.  He lives independently and his performance status is adequate.  He is relying on friends and church members for transportation.   He does not report any headaches, blurry vision, syncope or seizures. Does not report any fevers, chills or sweats.  Does not report any cough, wheezing or hemoptysis.  Does not report any chest pain, palpitation, orthopnea or leg edema.  Does not report any nausea, vomiting or abdominal pain.  Does not report any constipation or diarrhea.  Does not report any skeletal complaints.    Does not report frequency, urgency or hematuria.  Does not report any skin rashes or lesions. Does not report any heat or cold intolerance.  Does not report any lymphadenopathy or petechiae.  Does not report any anxiety or depression.  Remaining review of systems is negative.      Past Medical History:  Diagnosis Date   Diabetes mellitus    Enlarged prostate    Hypertension    Sleep apnea   :  No past surgical history on file.:   Current Outpatient Medications:    acetaminophen-codeine 120-12 MG/5ML solution, Take 10 mLs by mouth every 4 (four) hours as needed for moderate pain. (Patient not taking: Reported on 06/20/2022), Disp: 120 mL, Rfl: 0   ADVIL 200 MG CAPS, Take 400 mg by mouth every 6 (six) hours as needed (for mild pain or headaches)., Disp: , Rfl:    ALEVE 220 MG tablet, Take 220-440 mg by mouth 2 (two) times daily as needed (for mild pain or headaches)., Disp: , Rfl:    cetirizine (ZYRTEC) 10 MG tablet, Take 10 mg by mouth at bedtime., Disp: , Rfl:    ciprofloxacin (CIPRO) 500 MG tablet, Take 1 tablet (500 mg total) by mouth 2 (two) times daily., Disp: 14 tablet, Rfl: 0   finasteride (PROSCAR) 5 MG tablet, Take 5 mg by mouth at bedtime., Disp: , Rfl:    Guaifenesin 1200 MG TB12, Take 1 tablet (1,200 mg total) by mouth 2 (two) times daily. (Patient not taking: Reported on 06/20/2022), Disp: 20 each, Rfl: 0   ibuprofen (ADVIL,MOTRIN) 800 MG tablet, Take 1 tablet (800 mg total) by mouth every 8 (eight) hours as needed. (Patient not taking: Reported on 06/20/2022), Disp: 21 tablet, Rfl: 0   methocarbamol (ROBAXIN) 500 MG tablet, Take 1 tablet (500 mg total) by mouth 2 (two)  times daily. (Patient not taking: Reported on 06/20/2022), Disp: 20 tablet, Rfl: 0   simethicone (MYLICON) 80 MG chewable tablet, Chew 160 mg by mouth 3 (three) times daily as needed for flatulence., Disp: , Rfl:    tamsulosin (FLOMAX) 0.4 MG CAPS capsule, Take 0.4 mg by mouth at bedtime. , Disp: , Rfl: :   Allergies  Allergen Reactions   Aspirin Nausea And Vomiting  :  No family history on file.:   Social History   Socioeconomic History   Marital status: Married    Spouse name: Not on file   Number of children: Not on file   Years of education: Not on file   Highest  education level: Not on file  Occupational History   Not on file  Tobacco Use   Smoking status: Never   Smokeless tobacco: Never  Vaping Use   Vaping Use: Never used  Substance and Sexual Activity   Alcohol use: No   Drug use: No   Sexual activity: Not on file  Other Topics Concern   Not on file  Social History Narrative   Not on file   Social Determinants of Health   Financial Resource Strain: Not on file  Food Insecurity: Not on file  Transportation Needs: Not on file  Physical Activity: Not on file  Stress: Not on file  Social Connections: Not on file  Intimate Partner Violence: Not on file  :  Pertinent items are noted in HPI.  Exam: Blood pressure (!) 147/75, pulse 69, temperature 97.7 F (36.5 C), temperature source Temporal, resp. rate 16, height '5\' 1"'$  (1.549 m), weight 118 lb 9.6 oz (53.8 kg), SpO2 98 %. ECOG 1 General appearance: alert and cooperative appeared without distress. Head: atraumatic without any abnormalities. Eyes: conjunctivae/corneas clear. PERRL.  Sclera anicteric. Throat: lips, mucosa, and tongue normal; without oral thrush or ulcers. Resp: clear to auscultation bilaterally without rhonchi, wheezes or dullness to percussion. Cardio: regular rate and rhythm, S1, S2 normal, no murmur, click, rub or gallop GI: soft, non-tender; bowel sounds normal; no masses,  no organomegaly Skin: Skin color, texture, turgor normal. No rashes or lesions Lymph nodes: Cervical, supraclavicular, and axillary nodes normal. Neurologic: Grossly normal without any motor, sensory or deep tendon reflexes. Musculoskeletal: No joint deformity or effusion.    CT RENAL BIOPSY  Result Date: 08/30/2022 INDICATION: LEFT renal mass. EXAM: CT-GUIDED LEFT RENAL MASS CORE BIOPSY COMPARISON:  CT AP, 08/02/2022. MEDICATIONS: 10 mg hydralazine IV. ANESTHESIA/SEDATION: Moderate (conscious) sedation was employed during this procedure. A total of Versed 1 mg and Fentanyl 50 mcg was  administered intravenously. Moderate Sedation Time: 25 minutes. The patient's level of consciousness and vital signs were monitored continuously by radiology nursing throughout the procedure under my direct supervision. CONTRAST:  None. COMPLICATIONS: SIR Level A - No therapy, no consequence. A small LEFT perirenal hematoma was present and non-expanding on interval imaging. The patient remained vital signs stable and hemodynamically stable. PROCEDURE: RADIATION DOSE REDUCTION: This exam was performed according to the departmental dose-optimization program which includes automated exposure control, adjustment of the mA and/or kV according to patient size and/or use of iterative reconstruction technique. CT dose in mGy was not provided. Informed consent was obtained from the patient following an explanation of the procedure, risks, benefits and alternatives. A time out was performed prior to the initiation of the procedure. The patient was positioned prone on the CT table and a limited CT was performed for procedural planning demonstrating LEFT renal cortical mass. The  procedure was planned. The operative site was prepped and draped in the usual sterile fashion. Appropriate trajectory was confirmed with a 22 gauge spinal needle after the adjacent tissues were anesthetized with 1% Lidocaine with epinephrine. Under intermittent CT guidance, a 17 gauge coaxial needle was advanced into the peripheral aspect of the mass. Appropriate positioning was confirmed and 3 samples were obtained with an 18 gauge core needle biopsy device. The co-axial needle was removed and hemostasis was achieved with manual compression. A limited postprocedural CT was demonstrated small volume of LEFT perirenal hematoma. Interval imaging demonstrated no expansion. Patient remained VSS/HDS. a dressing was placed. The patient tolerated the procedure well without immediate postprocedural complication. IMPRESSION: Successful CT guided core needle  biopsy of LEFT renal mass, as above. Michaelle Birks, MD Vascular and Interventional Radiology Specialists [facility] Radiology Electronically Signed   By: Michaelle Birks M.D.   On: 08/30/2022 10:55    Assessment and Plan:    86 year old with:  1.  Stage IV clear-cell renal cell carcinoma with metastatic disease including abdominal adenopathy and pulmonary nodules.  Biopsy obtained of the left kidney confirmed the diagnosis.  His presentation support intermediate risk disease.  The natural course of this disease was reviewed at this time and treatment options were discussed.  The first step is to complete his staging scan including a PET scan as well as MRI of the brain to complete his staging.  Treatment options moving forward including oral targeted therapy with VGEF TKI, immunotherapy or combination immunotherapy with oral targeted therapy.  Ipilimumab and nivolumab complications were discussed at this time including nausea, fatigue and immune mediated issues.  Complications associated with oral targeted therapy such as cabozantinib or axitinib were discussed.  Nausea, diarrhea and excessive fatigue with weight loss and hypertension were reiterated.  After discussion today, he is agreeable to proceed with systemic therapy.  I favor combination immunotherapy with ipilimumab and nivolumab.  I think he will struggle with oral targeted therapy without the appropriate family support.  We will start with 4 cycles of ipilimumab and nivolumab and repeat imaging studies after labs.   2.  CNS staging: We will obtain MRI in the near future to complete his staging.  Radiation therapy may be required if he has CNS metastasis.  3.  T12 compression fracture: Evaluation by interventional radiology and possible radiation therapy could be considered if he has any issues.  4.  Prognosis and goals of care: Any treatment at this time is palliative.  His prognosis is overall guarded given the advanced disease stage.  5.   Autoimmune complications: We will continue to monitor and educate him about these issues.  These include pneumonitis, colitis and thyroid disease.  6.  Follow-up: In the near future to start therapy.  60  minutes were dedicated to this visit. The time was spent on reviewing laboratory data, imaging studies, discussing treatment options, and answering questions regarding future plan.     A copy of this consult has been forwarded to the requesting physician.

## 2022-09-07 NOTE — Telephone Encounter (Signed)
Per workque called and spoke to pt about appointments  

## 2022-09-08 ENCOUNTER — Other Ambulatory Visit: Payer: Self-pay

## 2022-09-09 ENCOUNTER — Other Ambulatory Visit: Payer: Self-pay

## 2022-09-10 ENCOUNTER — Other Ambulatory Visit: Payer: Self-pay

## 2022-09-10 ENCOUNTER — Inpatient Hospital Stay: Payer: Medicare Other

## 2022-09-10 DIAGNOSIS — C642 Malignant neoplasm of left kidney, except renal pelvis: Secondary | ICD-10-CM

## 2022-09-10 MED ORDER — PROCHLORPERAZINE MALEATE 10 MG PO TABS: 10.0000 mg | ORAL_TABLET | Freq: Four times a day (QID) | ORAL | 1 refills | Status: DC | PRN

## 2022-09-13 ENCOUNTER — Encounter: Payer: Self-pay | Admitting: Oncology

## 2022-09-13 ENCOUNTER — Inpatient Hospital Stay: Payer: Medicare Other

## 2022-09-13 NOTE — Progress Notes (Signed)
[place] Work  Initial Assessment   Jagar Lua is a 86 y.o. year old male contacted by phone. Clinical Social Work was referred by new patient protocol for assessment of psychosocial needs.   SDOH (Social Determinants of Health) assessments performed: Yes SDOH Interventions    Flowsheet Row Clinical Support from 09/13/2022 in [facility]  SDOH Interventions   Food Insecurity Interventions Intervention Not Indicated  Housing Interventions Intervention Not Indicated  Transportation Interventions Intervention Not Indicated  Utilities Interventions Intervention Not Indicated  Financial Strain Interventions Intervention Not Indicated       SDOH Screenings   Food Insecurity: No Food Insecurity (09/13/2022)  Housing: Low Risk  (09/13/2022)  Transportation Needs: No Transportation Needs (09/13/2022)  Utilities: Not At Risk (09/13/2022)  Financial Resource Strain: Low Risk  (09/13/2022)  Tobacco Use: Low Risk  (08/30/2022)     Distress Screen completed: No     No data to display            Family/Social Information:  Housing Arrangement: patient lives alone Family members/support persons in your life? 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  Patient was in the [employer] for 22 1/2 years.  Income source: Employment and Paediatric nurse concerns: No Type of concern: None Food access concerns: no Religious or spiritual practice: Yes-He said he is a member of [employer] Currently in place:  [employer], [place] and the [place].  Coping/ Adjustment to diagnosis: Patient understands treatment plan and what happens next? yes Concerns about diagnosis and/or treatment: I'm not especially worried about anything Patient reported stressors:  Not being able to work in the future due to cancer diagnosis. Hopes and/or  priorities: To get better. Patient enjoys  being a positive person. Current coping skills/ strengths: Capable of independent living , Communication skills , Financial means , General fund of knowledge , and Religious Affiliation     SUMMARY: Current SDOH Barriers:  None per patient.  Clinical Social Work Clinical Goal(s):  No clinical social work goals at this time  Interventions: Discussed common feeling and emotions when being diagnosed with cancer, and the importance of support during treatment Informed patient of the support team roles and support services at [facility] Provided [place] contact information and encouraged patient to call with any questions or concerns Provided patient with information about social work role.   Follow Up Plan: Patient will contact CSW with any support or resource needs Patient verbalizes understanding of plan: Yes    Rodman Pickle Starleen Trussell, LCSW

## 2022-09-13 NOTE — Progress Notes (Signed)
Patient's authorized friend [name] called after receiving my card per my request to inquire about available assistance.  Introduced myself as Arboriculturist and to offer available resources.   Discussed one-time $1000 Radio broadcast assistant to assist with personal expenses while going through treatment. Advised what would be needed to apply.  She states main concern was assistance with large bills due to insurance stating they weren't covering some of it. Advised her once bill is received, to call the number on the bill to discuss insurance payment status. She states bill was received from [facility]. Advised to contact number on bill to discuss their billing and that this bill is outside [facility]. She verbalized understanding.  Advised if there are any available programs for him after [facility] billing is complete other than the [employer], someone will reach out to him to advise and what is needed such as income which is also required for [facility]. She was very Patent attorney.  She has my card for any additional financial questions or concerns.

## 2022-09-16 ENCOUNTER — Ambulatory Visit (HOSPITAL_COMMUNITY)
Admission: RE | Admit: 2022-09-16 | Discharge: 2022-09-16 | Disposition: A | Discharge status: Home / Self Care | Payer: Medicare Other | Source: Ambulatory Visit | Attending: Oncology | Admitting: Oncology

## 2022-09-16 DIAGNOSIS — C642 Malignant neoplasm of left kidney, except renal pelvis: Secondary | ICD-10-CM | POA: Diagnosis not present

## 2022-09-16 DIAGNOSIS — R918 Other nonspecific abnormal finding of lung field: Secondary | ICD-10-CM | POA: Insufficient documentation

## 2022-09-16 LAB — GLUCOSE, CAPILLARY: Glucose-Capillary: 78 mg/dL (ref 70–99)

## 2022-09-16 MED ORDER — GADOPICLENOL 0.5 MMOL/ML IV SOLN
5.5000 mL | Freq: Once | INTRAVENOUS | Status: AC | PRN
  Administered 2022-09-16: 5.5 mL via INTRAVENOUS

## 2022-09-16 MED ORDER — FLUDEOXYGLUCOSE F - 18 (FDG) INJECTION
5.8500 | Freq: Once | INTRAVENOUS | Status: AC
  Administered 2022-09-16: 5.85 via INTRAVENOUS

## 2022-09-20 ENCOUNTER — Other Ambulatory Visit: Payer: Self-pay

## 2022-09-21 ENCOUNTER — Inpatient Hospital Stay: Payer: Medicare PPO | Attending: Oncology

## 2022-09-21 ENCOUNTER — Inpatient Hospital Stay: Payer: Medicare PPO

## 2022-09-21 ENCOUNTER — Other Ambulatory Visit: Payer: Self-pay

## 2022-09-21 VITALS — BP 166/96 | HR 71 | Temp 97.8°F | Resp 16 | Wt 118.8 lb

## 2022-09-21 DIAGNOSIS — D649 Anemia, unspecified: Secondary | ICD-10-CM | POA: Diagnosis not present

## 2022-09-21 DIAGNOSIS — Z5111 Encounter for antineoplastic chemotherapy: Secondary | ICD-10-CM | POA: Insufficient documentation

## 2022-09-21 DIAGNOSIS — Z79899 Other long term (current) drug therapy: Secondary | ICD-10-CM | POA: Diagnosis not present

## 2022-09-21 DIAGNOSIS — C7800 Secondary malignant neoplasm of unspecified lung: Secondary | ICD-10-CM | POA: Insufficient documentation

## 2022-09-21 DIAGNOSIS — C642 Malignant neoplasm of left kidney, except renal pelvis: Secondary | ICD-10-CM

## 2022-09-21 DIAGNOSIS — C649 Malignant neoplasm of unspecified kidney, except renal pelvis: Secondary | ICD-10-CM | POA: Insufficient documentation

## 2022-09-21 DIAGNOSIS — M4854XA Collapsed vertebra, not elsewhere classified, thoracic region, initial encounter for fracture: Secondary | ICD-10-CM | POA: Diagnosis not present

## 2022-09-21 LAB — CBC WITH DIFFERENTIAL (CANCER CENTER ONLY)
Abs Immature Granulocytes: 0.03 10*3/uL (ref 0.00–0.07)
Basophils Absolute: 0 10*3/uL (ref 0.0–0.1)
Basophils Relative: 0 %
Eosinophils Absolute: 0.2 10*3/uL (ref 0.0–0.5)
Eosinophils Relative: 2 %
HCT: 22.9 % — ABNORMAL LOW (ref 39.0–52.0)
Hemoglobin: 7.4 g/dL — ABNORMAL LOW (ref 13.0–17.0)
Immature Granulocytes: 0 %
Lymphocytes Relative: 9 %
Lymphs Abs: 0.7 10*3/uL (ref 0.7–4.0)
MCH: 27.7 pg (ref 26.0–34.0)
MCHC: 32.3 g/dL (ref 30.0–36.0)
MCV: 85.8 fL (ref 80.0–100.0)
Monocytes Absolute: 0.9 10*3/uL (ref 0.1–1.0)
Monocytes Relative: 11 %
Neutro Abs: 5.9 10*3/uL (ref 1.7–7.7)
Neutrophils Relative %: 78 %
Platelet Count: 170 10*3/uL (ref 150–400)
RBC: 2.67 MIL/uL — ABNORMAL LOW (ref 4.22–5.81)
RDW: 12.8 % (ref 11.5–15.5)
WBC Count: 7.7 10*3/uL (ref 4.0–10.5)
nRBC: 0 % (ref 0.0–0.2)

## 2022-09-21 LAB — CMP (CANCER CENTER ONLY)
ALT: 8 U/L (ref 0–44)
AST: 13 U/L — ABNORMAL LOW (ref 15–41)
Albumin: 3.6 g/dL (ref 3.5–5.0)
Alkaline Phosphatase: 79 U/L (ref 38–126)
Anion gap: 5 (ref 5–15)
BUN: 9 mg/dL (ref 8–23)
CO2: 26 mmol/L (ref 22–32)
Calcium: 8.1 mg/dL — ABNORMAL LOW (ref 8.9–10.3)
Chloride: 98 mmol/L (ref 98–111)
Creatinine: 0.54 mg/dL — ABNORMAL LOW (ref 0.61–1.24)
GFR, Estimated: >60 mL/min (ref >60)
Glucose, Bld: 101 mg/dL — ABNORMAL HIGH (ref 70–99)
Potassium: 4 mmol/L (ref 3.5–5.1)
Sodium: 129 mmol/L — ABNORMAL LOW (ref 135–145)
Total Bilirubin: 0.4 mg/dL (ref 0.3–1.2)
Total Protein: 6.6 g/dL (ref 6.5–8.1)

## 2022-09-21 LAB — TSH: TSH: 0.025 u[IU]/mL — ABNORMAL LOW (ref 0.350–4.500)

## 2022-09-21 MED ORDER — SODIUM CHLORIDE 0.9 % IV SOLN: Freq: Once | INTRAVENOUS | Status: AC

## 2022-09-21 MED ORDER — SODIUM CHLORIDE 0.9 % IV SOLN
2.9700 mg/kg | Freq: Once | INTRAVENOUS | Status: AC
  Administered 2022-09-21: 160 mg via INTRAVENOUS
  Filled 2022-09-21: qty 10

## 2022-09-21 MED ORDER — FAMOTIDINE IN NACL 20-0.9 MG/50ML-% IV SOLN
20.0000 mg | Freq: Once | INTRAVENOUS | Status: AC
  Administered 2022-09-21: 20 mg via INTRAVENOUS
  Filled 2022-09-21: qty 50

## 2022-09-21 MED ORDER — DIPHENHYDRAMINE HCL 50 MG/ML IJ SOLN
25.0000 mg | Freq: Once | INTRAMUSCULAR | Status: AC
  Administered 2022-09-21: 25 mg via INTRAVENOUS
  Filled 2022-09-21: qty 1

## 2022-09-21 MED ORDER — SODIUM CHLORIDE 0.9 % IV SOLN
1.0000 mg/kg | Freq: Once | INTRAVENOUS | Status: AC
  Administered 2022-09-21: 50 mg via INTRAVENOUS
  Filled 2022-09-21: qty 10

## 2022-09-21 NOTE — Progress Notes (Signed)
Ipilimumab (YERVOY) Patient Monitoring Assessment   Is the patient experiencing any of the following general symptoms?:  '[ ]'$ Difficulty performing normal activities '[ ]'$ Feeling sluggish or cold all the time '[ ]'$ Unusual weight gain '[ ]'$ Constant or unusual headaches '[ ]'$ Feeling dizzy or faint '[ ]'$ Changes in eyesight (blurry vision, double vision, or other vision problems) '[ ]'$ Changes in mood or behavior (ex: decreased sex drive, irritability, or forgetfulness) '[ ]'$ Starting new medications (ex: steroids, other medications that lower immune response) [ X]Patient is not experiencing any of the general symptoms above.   Gastrointestinal  Patient is having normal bowel movements each day.  Is this different from baseline? '[ ]'$ Yes [ X]No Are your stools watery or do they have a foul smell? '[ ]'$ Yes [ X]No Have you seen blood in your stools? '[ ]'$ Yes [ X]No Are your stools dark, tarry, or sticky? '[ ]'$ Yes [X ]No Are you having pain or tenderness in your belly? '[ ]'$ Yes '[ ]'$ XNo  Skin Does your skin itch? '[ ]'$ Yes [ X]No Do you have a rash? '[ ]'$ Yes [ X]No Has your skin blistered and/or peeled? '[ ]'$ Yes [ X]No Do you have sores in your mouth? '[ ]'$ Yes [ X]No  Hepatic Has your urine been dark or tea colored? '[ ]'$ Yes [ X]No Have you noticed that your skin or the whites of your eyes are turning yellow? '[ ]'$ Yes [ X]No Are you bleeding or bruising more easily than normal? '[ ]'$ Yes [ X]No Are you nauseous and/or vomiting? '[ ]'$ Yes [ X]No Do you have pain on the right side of your stomach? '[ ]'$ Yes [ X]No  Neurologic  Are you having unusual weakness of legs, arms, or face? '[ ]'$ Yes [ X]No Are you having numbness or tingling in your hands or feet? '[ ]'$ Yes [ X]No  Kathlen Brunswick- late note.  Assessment conducted at [facility]

## 2022-09-21 NOTE — Progress Notes (Signed)
Per Dr Alen Blew, ok to proceed with treatment today with HGB 7.4 g/dL

## 2022-09-21 NOTE — Patient Instructions (Signed)
[facility]  Discharge Instructions: Thank you for choosing [facility] to provide your oncology and hematology care.   If you have a lab appointment with the Cancer Center, please go directly to the Cancer Center and check in at the registration area.   Wear comfortable clothing and clothing appropriate for easy access to any Portacath or PICC line.   We strive to give you quality time with your provider. You may need to reschedule your appointment if you arrive late (15 or more minutes).  Arriving late affects you and other patients whose appointments are after yours.  Also, if you miss three or more appointments without notifying the office, you may be dismissed from the clinic at the provider's discretion.      For prescription refill requests, have your pharmacy contact our office and allow 72 hours for refills to be completed.    Today you received the following chemotherapy and/or immunotherapy agents Opdivo, Yervoy      To help prevent nausea and vomiting after your treatment, we encourage you to take your nausea medication as directed.  BELOW ARE SYMPTOMS THAT SHOULD BE REPORTED IMMEDIATELY: *FEVER GREATER THAN 100.4 F (38 C) OR HIGHER *CHILLS OR SWEATING *NAUSEA AND VOMITING THAT IS NOT CONTROLLED WITH YOUR NAUSEA MEDICATION *UNUSUAL SHORTNESS OF BREATH *UNUSUAL BRUISING OR BLEEDING *URINARY PROBLEMS (pain or burning when urinating, or frequent urination) *BOWEL PROBLEMS (unusual diarrhea, constipation, pain near the anus) TENDERNESS IN MOUTH AND THROAT WITH OR WITHOUT PRESENCE OF ULCERS (sore throat, sores in mouth, or a toothache) UNUSUAL RASH, SWELLING OR PAIN  UNUSUAL VAGINAL DISCHARGE OR ITCHING   Items with * indicate a potential emergency and should be followed up as soon as possible or go to the Emergency Department if any problems should occur.  Please show the CHEMOTHERAPY ALERT CARD or IMMUNOTHERAPY ALERT CARD at check-in  to the Emergency Department and triage nurse.  Should you have questions after your visit or need to cancel or reschedule your appointment, please contact [facility]  Dept: [phone]  and follow the prompts.  Office hours are 8:00 a.m. to 4:30 p.m. Monday - Friday. Please note that voicemails left after 4:00 p.m. may not be returned until the following business day.  We are closed weekends and major holidays. You have access to a nurse at all times for urgent questions. Please call the main number to the clinic Dept: [phone] and follow the prompts.   For any non-urgent questions, you may also contact your provider using MyChart. We now offer e-Visits for anyone 18 and older to request care online for non-urgent symptoms. For details visit [email or web address].   Also download the MyChart app! Go to the app store, search "MyChart", open the app, select [facility], and log in with your MyChart username and password.  Masks are optional in the cancer centers. If you would like for your care team to wear a mask while they are taking care of you, please let them know. You may have one support person who is at least 84 years old accompany you for your appointments. Ipilimumab Injection What is this medication? IPILIMUMAB (IP i LIM ue mab) treats some types of cancer. It works by helping your immune system slow or stop the spread of cancer cells. It is a monoclonal antibody. This medicine may be used for other purposes; ask your health care provider or pharmacist if you have questions. COMMON BRAND NAME(S): YERVOY What   should I tell my care team before I take this medication? They need to know if you have any of these conditions: Allogeneic stem cell transplant (uses someone else's stem cells) Autoimmune diseases, such as Crohn disease, ulcerative colitis, lupus Nervous system problems, such as Guillain-Barre syndrome or myasthenia gravis Organ transplant An  unusual or allergic reaction to ipilimumab, other medications, foods, dyes, or preservatives Pregnant or trying to get pregnant Breast-feeding How should I use this medication? This medication is infused into a vein. It is given by your care team in a hospital or clinic setting. A special MedGuide will be given to you before each treatment. Be sure to read this information carefully each time. Talk to your care team about the use of this medication in children. While it may be prescribed for children as young as 12 years for selected conditions, precautions do apply. Overdosage: If you think you have taken too much of this medicine contact a poison control center or emergency room at once. NOTE: This medicine is only for you. Do not share this medicine with others. What if I miss a dose? Keep appointments for follow-up doses. It is important not to miss your dose. Call your care team if you are unable to keep an appointment. What may interact with this medication? Interactions are not expected. This list may not describe all possible interactions. Give your health care provider a list of all the medicines, herbs, non-prescription drugs, or dietary supplements you use. Also tell them if you smoke, drink alcohol, or use illegal drugs. Some items may interact with your medicine. What should I watch for while using this medication? Your condition will be monitored carefully while you are receiving this medication. You may need blood work while taking this medication. This medication may cause serious skin reactions. They can happen weeks to months after starting the medication. Contact your care team right away if you notice fevers or flu-like symptoms with a rash. The rash may be red or purple and then turn into blisters or peeling of the skin. You may also notice a red rash with swelling of the face, lips, or lymph nodes in your neck or under your arms. Tell your care team right away if you have any  change in your eyesight. Talk to your care team if you may be pregnant. Serious birth defects can occur if you take this medication during pregnancy and for 3 months after the last dose. You will need a negative pregnancy test before starting this medication. Contraception is recommended while taking this medication and for 3 months after the last dose. Your care team can help you find the option that works for you. Do not breastfeed while taking this medication and for 3 months after the last dose. What side effects may I notice from receiving this medication? Side effects that you should report to your care team as soon as possible: Allergic reactions--skin rash, itching, hives, swelling of the face, lips, tongue, or throat Dry cough, shortness of breath or trouble breathing Eye pain, redness, irritation, or discharge with blurry or decreased vision Heart muscle inflammation--unusual weakness or fatigue, shortness of breath, chest pain, fast or irregular heartbeat, dizziness, swelling of the ankles, feet, or hands Hormone gland problems--headache, sensitivity to light, unusual weakness or fatigue, dizziness, fast or irregular heartbeat, increased sensitivity to cold or heat, excessive sweating, constipation, hair loss, increased thirst or amount of urine, tremors or shaking, irritability Infusion reactions--chest pain, shortness of breath or trouble breathing, feeling   faint or lightheaded Kidney injury (glomerulonephritis)--decrease in the amount of urine, red or dark brown urine, foamy or bubbly urine, swelling of the ankles, hands, or feet Liver injury--right upper belly pain, loss of appetite, nausea, light-colored stool, dark yellow or brown urine, yellowing skin or eyes, unusual weakness or fatigue Pain, tingling, or numbness in the hands or feet, muscle weakness, change in vision, confusion or trouble speaking, loss of balance or coordination, trouble walking, seizures Rash, fever, and swollen  lymph nodes Redness, blistering, peeling, or loosening of the skin, including inside the mouth Sudden or severe stomach pain, bloody diarrhea, fever, nausea, vomiting Side effects that usually do not require medical attention (report to your care team if they continue or are bothersome): Bone, joint, or muscle pain Diarrhea Fatigue Loss of appetite Nausea Skin rash This list may not describe all possible side effects. Call your doctor for medical advice about side effects. You may report side effects to FDA at 1-800-FDA-1088. Where should I keep my medication? This medication is given in a hospital or clinic. It will not be stored at home. NOTE: This sheet is a summary. It may not cover all possible information. If you have questions about this medicine, talk to your doctor, pharmacist, or health care provider.  2023 Elsevier/Gold Standard (2022-04-20 00:00:00) Nivolumab Injection What is this medication? NIVOLUMAB (nye VOL ue mab) treats some types of cancer. It works by helping your immune system slow or stop the spread of cancer cells. It is a monoclonal antibody. This medicine may be used for other purposes; ask your health care provider or pharmacist if you have questions. COMMON BRAND NAME(S): Opdivo What should I tell my care team before I take this medication? They need to know if you have any of these conditions: Allogeneic stem cell transplant (uses someone else's stem cells) Autoimmune diseases, such as Crohn disease, ulcerative colitis, lupus History of chest radiation Nervous system problems, such as Guillain-Barre syndrome or myasthenia gravis Organ transplant An unusual or allergic reaction to nivolumab, other medications, foods, dyes, or preservatives Pregnant or trying to get pregnant Breast-feeding How should I use this medication? This medication is infused into a vein. It is given in a hospital or clinic setting. A special MedGuide will be given to you before each  treatment. Be sure to read this information carefully each time. Talk to your care team about the use of this medication in children. While it may be prescribed for children as young as 12 years for selected conditions, precautions do apply. Overdosage: If you think you have taken too much of this medicine contact a poison control center or emergency room at once. NOTE: This medicine is only for you. Do not share this medicine with others. What if I miss a dose? Keep appointments for follow-up doses. It is important not to miss your dose. Call your care team if you are unable to keep an appointment. What may interact with this medication? Interactions have not been studied. This list may not describe all possible interactions. Give your health care provider a list of all the medicines, herbs, non-prescription drugs, or dietary supplements you use. Also tell them if you smoke, drink alcohol, or use illegal drugs. Some items may interact with your medicine. What should I watch for while using this medication? Your condition will be monitored carefully while you are receiving this medication. You may need blood work while taking this medication. This medication may cause serious skin reactions. They can happen weeks to months   after starting the medication. Contact your care team right away if you notice fevers or flu-like symptoms with a rash. The rash may be red or purple and then turn into blisters or peeling of the skin. You may also notice a red rash with swelling of the face, lips, or lymph nodes in your neck or under your arms. Tell your care team right away if you have any change in your eyesight. Talk to your care team if you are pregnant or think you might be pregnant. A negative pregnancy test is required before starting this medication. A reliable form of contraception is recommended while taking this medication and for 5 months after the last dose. Talk to your care team about effective forms  of contraception. Do not breast-feed while taking this medication and for 5 months after the last dose. What side effects may I notice from receiving this medication? Side effects that you should report to your care team as soon as possible: Allergic reactions--skin rash, itching, hives, swelling of the face, lips, tongue, or throat Dry cough, shortness of breath or trouble breathing Eye pain, redness, irritation, or discharge with blurry or decreased vision Heart muscle inflammation--unusual weakness or fatigue, shortness of breath, chest pain, fast or irregular heartbeat, dizziness, swelling of the ankles, feet, or hands Hormone gland problems--headache, sensitivity to light, unusual weakness or fatigue, dizziness, fast or irregular heartbeat, increased sensitivity to cold or heat, excessive sweating, constipation, hair loss, increased thirst or amount of urine, tremors or shaking, irritability Infusion reactions--chest pain, shortness of breath or trouble breathing, feeling faint or lightheaded Kidney injury (glomerulonephritis)--decrease in the amount of urine, red or dark brown urine, foamy or bubbly urine, swelling of the ankles, hands, or feet Liver injury--right upper belly pain, loss of appetite, nausea, light-colored stool, dark yellow or brown urine, yellowing skin or eyes, unusual weakness or fatigue Pain, tingling, or numbness in the hands or feet, muscle weakness, change in vision, confusion or trouble speaking, loss of balance or coordination, trouble walking, seizures Rash, fever, and swollen lymph nodes Redness, blistering, peeling, or loosening of the skin, including inside the mouth Sudden or severe stomach pain, bloody diarrhea, fever, nausea, vomiting Side effects that usually do not require medical attention (report these to your care team if they continue or are bothersome): Bone, joint, or muscle pain Diarrhea Fatigue Loss of appetite Nausea Skin rash This list may not  describe all possible side effects. Call your doctor for medical advice about side effects. You may report side effects to FDA at 1-800-FDA-1088. Where should I keep my medication? This medication is given in a hospital or clinic. It will not be stored at home. NOTE: This sheet is a summary. It may not cover all possible information. If you have questions about this medicine, talk to your doctor, pharmacist, or health care provider.  2023 Elsevier/Gold Standard (2021-11-06 00:00:00) 

## 2022-09-22 ENCOUNTER — Other Ambulatory Visit: Payer: Self-pay

## 2022-09-23 ENCOUNTER — Other Ambulatory Visit: Payer: Self-pay

## 2022-09-23 LAB — T4: T4, Total: 8.5 ug/dL (ref 4.5–12.0)

## 2022-09-26 ENCOUNTER — Other Ambulatory Visit: Payer: Self-pay

## 2022-09-27 ENCOUNTER — Other Ambulatory Visit: Payer: Self-pay

## 2022-09-30 ENCOUNTER — Telehealth: Payer: Self-pay | Admitting: *Deleted

## 2022-09-30 NOTE — Telephone Encounter (Signed)
Notified that Dr Alen Blew is OK with him getting shots

## 2022-09-30 NOTE — Telephone Encounter (Signed)
Family wants to know if it is OK for Anthony Sheppard to get the flu shot and new Covid shot at his drugstore

## 2022-10-05 ENCOUNTER — Encounter: Payer: Self-pay | Admitting: Oncology

## 2022-10-11 ENCOUNTER — Other Ambulatory Visit: Payer: Self-pay | Admitting: *Deleted

## 2022-10-12 ENCOUNTER — Inpatient Hospital Stay (HOSPITAL_BASED_OUTPATIENT_CLINIC_OR_DEPARTMENT_OTHER): Payer: Medicare PPO | Admitting: Oncology

## 2022-10-12 ENCOUNTER — Other Ambulatory Visit: Payer: Self-pay | Admitting: *Deleted

## 2022-10-12 ENCOUNTER — Other Ambulatory Visit: Payer: Self-pay | Admitting: Oncology

## 2022-10-12 ENCOUNTER — Inpatient Hospital Stay: Payer: Medicare PPO

## 2022-10-12 ENCOUNTER — Other Ambulatory Visit: Payer: Self-pay

## 2022-10-12 VITALS — BP 141/69 | HR 76 | Temp 97.8°F | Resp 16 | Ht 61.0 in | Wt 116.0 lb

## 2022-10-12 DIAGNOSIS — D649 Anemia, unspecified: Secondary | ICD-10-CM

## 2022-10-12 DIAGNOSIS — Z5111 Encounter for antineoplastic chemotherapy: Secondary | ICD-10-CM | POA: Diagnosis not present

## 2022-10-12 DIAGNOSIS — C642 Malignant neoplasm of left kidney, except renal pelvis: Secondary | ICD-10-CM

## 2022-10-12 DIAGNOSIS — M4854XA Collapsed vertebra, not elsewhere classified, thoracic region, initial encounter for fracture: Secondary | ICD-10-CM | POA: Diagnosis not present

## 2022-10-12 DIAGNOSIS — Z79899 Other long term (current) drug therapy: Secondary | ICD-10-CM | POA: Diagnosis not present

## 2022-10-12 DIAGNOSIS — C7800 Secondary malignant neoplasm of unspecified lung: Secondary | ICD-10-CM | POA: Diagnosis not present

## 2022-10-12 DIAGNOSIS — C649 Malignant neoplasm of unspecified kidney, except renal pelvis: Secondary | ICD-10-CM | POA: Diagnosis not present

## 2022-10-12 LAB — CMP (CANCER CENTER ONLY)
ALT: 6 U/L (ref 0–44)
AST: 12 U/L — ABNORMAL LOW (ref 15–41)
Albumin: 3.4 g/dL — ABNORMAL LOW (ref 3.5–5.0)
Alkaline Phosphatase: 73 U/L (ref 38–126)
Anion gap: 8 (ref 5–15)
BUN: 18 mg/dL (ref 8–23)
CO2: 25 mmol/L (ref 22–32)
Calcium: 8.5 mg/dL — ABNORMAL LOW (ref 8.9–10.3)
Chloride: 104 mmol/L (ref 98–111)
Creatinine: 0.57 mg/dL — ABNORMAL LOW (ref 0.61–1.24)
GFR, Estimated: >60 mL/min (ref >60)
Glucose, Bld: 75 mg/dL (ref 70–99)
Potassium: 4 mmol/L (ref 3.5–5.1)
Sodium: 137 mmol/L (ref 135–145)
Total Bilirubin: 0.4 mg/dL (ref 0.3–1.2)
Total Protein: 7.2 g/dL (ref 6.5–8.1)

## 2022-10-12 LAB — CBC WITH DIFFERENTIAL (CANCER CENTER ONLY)
Abs Immature Granulocytes: 0.02 10*3/uL (ref 0.00–0.07)
Basophils Absolute: 0 10*3/uL (ref 0.0–0.1)
Basophils Relative: 1 %
Eosinophils Absolute: 0.5 10*3/uL (ref 0.0–0.5)
Eosinophils Relative: 8 %
HCT: 22.3 % — ABNORMAL LOW (ref 39.0–52.0)
Hemoglobin: 6.7 g/dL — CL (ref 13.0–17.0)
Immature Granulocytes: 0 %
Lymphocytes Relative: 15 %
Lymphs Abs: 0.9 10*3/uL (ref 0.7–4.0)
MCH: 24.6 pg — ABNORMAL LOW (ref 26.0–34.0)
MCHC: 30 g/dL (ref 30.0–36.0)
MCV: 82 fL (ref 80.0–100.0)
Monocytes Absolute: 0.6 10*3/uL (ref 0.1–1.0)
Monocytes Relative: 11 %
Neutro Abs: 3.8 10*3/uL (ref 1.7–7.7)
Neutrophils Relative %: 65 %
Platelet Count: 375 10*3/uL (ref 150–400)
RBC: 2.72 MIL/uL — ABNORMAL LOW (ref 4.22–5.81)
RDW: 14.3 % (ref 11.5–15.5)
WBC Count: 5.8 10*3/uL (ref 4.0–10.5)
nRBC: 0 % (ref 0.0–0.2)

## 2022-10-12 LAB — SAMPLE TO BLOOD BANK

## 2022-10-12 LAB — ABO/RH: ABO/RH(D): O POS

## 2022-10-12 LAB — PREPARE RBC (CROSSMATCH)

## 2022-10-12 MED ORDER — DIPHENHYDRAMINE HCL 50 MG/ML IJ SOLN
25.0000 mg | Freq: Once | INTRAMUSCULAR | Status: AC
  Administered 2022-10-12: 25 mg via INTRAVENOUS
  Filled 2022-10-12: qty 1

## 2022-10-12 MED ORDER — SODIUM CHLORIDE 0.9 % IV SOLN
1.0000 mg/kg | Freq: Once | INTRAVENOUS | Status: AC
  Administered 2022-10-12: 50 mg via INTRAVENOUS
  Filled 2022-10-12: qty 10

## 2022-10-12 MED ORDER — FAMOTIDINE IN NACL 20-0.9 MG/50ML-% IV SOLN
20.0000 mg | Freq: Once | INTRAVENOUS | Status: AC
  Administered 2022-10-12: 20 mg via INTRAVENOUS
  Filled 2022-10-12: qty 50

## 2022-10-12 MED ORDER — SODIUM CHLORIDE 0.9 % IV SOLN
2.9700 mg/kg | Freq: Once | INTRAVENOUS | Status: AC
  Administered 2022-10-12: 160 mg via INTRAVENOUS
  Filled 2022-10-12: qty 16

## 2022-10-12 MED ORDER — SODIUM CHLORIDE 0.9 % IV SOLN: Freq: Once | INTRAVENOUS | Status: AC

## 2022-10-12 NOTE — Progress Notes (Signed)
Per Dr Alen Blew, ok for patient to have tx today with Hgb of 6.7

## 2022-10-12 NOTE — Progress Notes (Signed)
Patient informed of blood transfusion tomorrow at 0830, he confirms he can be here.  Orders entered, patient escorted to lab for blood work, instructed patient not to take blue blood bank bracelet off before his transfusion tomorrow, he verbalizes understanding.

## 2022-10-12 NOTE — Progress Notes (Signed)
Hematology and Oncology Follow Up Visit  Anthony Sheppard [id number] 10-Feb-1936 86 y.o. 10/12/2022 11:25 AM System, Provider Not Zella Richer, MD   Principle Diagnosis: 86 year old man with stage IV clear-cell renal carcinoma with abdominal adenopathy and pulmonary nodules diagnosed in September 2023.  He has intermediate risk disease.   Prior Therapy: He is status post kidney biopsy completed on August 30, 2022.  This confirmed the presence of renal cell carcinoma.  Current therapy: Ipilimumab 1 mg/kg with nivolumab 3 mg/kg started September 21, 2022.  He is here for cycle 2 of therapy.  Interim History: Anthony Sheppard returns today for a follow-up.  Since the last visit, he completed the first cycle of ipilimumab and nivolumab without any major complications.  He denies any nausea, vomiting or abdominal pain.  He did report some mild fatigue and tiredness but no shortness of breath or difficulty breathing.  His appetite remains low.  He denies hematochezia or melena.  Continues to have episodic hematuria which has decreased significantly.     Medications: I have reviewed the patient's current medications.  Current Outpatient Medications  Medication Sig Dispense Refill   acetaminophen-codeine 120-12 MG/5ML solution Take 10 mLs by mouth every 4 (four) hours as needed for moderate pain. 120 mL 0   ADVIL 200 MG CAPS Take 400 mg by mouth every 6 (six) hours as needed (for mild pain or headaches).     ALEVE 220 MG tablet Take 220-440 mg by mouth 2 (two) times daily as needed (for mild pain or headaches).     cetirizine (ZYRTEC) 10 MG tablet Take 10 mg by mouth at bedtime.     ciprofloxacin (CIPRO) 500 MG tablet Take 1 tablet (500 mg total) by mouth 2 (two) times daily. 14 tablet 0   finasteride (PROSCAR) 5 MG tablet Take 5 mg by mouth at bedtime.     Guaifenesin 1200 MG TB12 Take 1 tablet (1,200 mg total) by mouth 2 (two) times daily. 20 each 0   ibuprofen (ADVIL,MOTRIN) 800 MG tablet Take 1  tablet (800 mg total) by mouth every 8 (eight) hours as needed. 21 tablet 0   methocarbamol (ROBAXIN) 500 MG tablet Take 1 tablet (500 mg total) by mouth 2 (two) times daily. 20 tablet 0   prochlorperazine (COMPAZINE) 10 MG tablet Take 1 tablet (10 mg total) by mouth every 6 (six) hours as needed for nausea or vomiting. 30 tablet 1   simethicone (MYLICON) 80 MG chewable tablet Chew 160 mg by mouth 3 (three) times daily as needed for flatulence. (Patient not taking: Reported on 09/07/2022)     tamsulosin (FLOMAX) 0.4 MG CAPS capsule Take 0.4 mg by mouth at bedtime.      No current facility-administered medications for this visit.     Allergies:  Allergies  Allergen Reactions   Aspirin Nausea And Vomiting      Physical Exam:  ECOG:     General appearance: Comfortable appearing without any discomfort Head: Normocephalic without any trauma Oropharynx: Mucous membranes are moist and pink without any thrush or ulcers. Eyes: Pupils are equal and round reactive to light. Lymph nodes: No cervical, supraclavicular, inguinal or axillary lymphadenopathy.   Heart:regular rate and rhythm.  S1 and S2 without leg edema. Lung: Clear without any rhonchi or wheezes.  No dullness to percussion. Abdomin: Soft, nontender, nondistended with good bowel sounds.  No hepatosplenomegaly. Musculoskeletal: No joint deformity or effusion.  Full range of motion noted. Neurological: No deficits noted on motor, sensory and deep tendon  reflex exam. Skin: No petechial rash or dryness.  Appeared moist.      Lab Results: Lab Results  Component Value Date   WBC 7.7 09/21/2022   HGB 7.4 (L) 09/21/2022   HCT 22.9 (L) 09/21/2022   MCV 85.8 09/21/2022   PLT 170 09/21/2022     Chemistry      Component Value Date/Time   NA 129 (L) 09/21/2022 1313   K 4.0 09/21/2022 1313   CL 98 09/21/2022 1313   CO2 26 09/21/2022 1313   BUN 9 09/21/2022 1313   CREATININE 0.54 (L) 09/21/2022 1313      Component Value  Date/Time   CALCIUM 8.1 (L) 09/21/2022 1313   ALKPHOS 79 09/21/2022 1313   AST 13 (L) 09/21/2022 1313   ALT 8 09/21/2022 1313   BILITOT 0.4 09/21/2022 1313         Impression and Plan:  86 year old with:   1.  Kidney cancer diagnosed in September 2023.  He was found to have stage IV clear-cell renal cell carcinoma with metastatic disease including abdominal adenopathy and pulmonary nodules.  Marland Kitchen   He is currently receiving ipilimumab and nivolumab without any major complications.  Risks and benefits of proceeding with cycle 2 of therapy were discussed.  These include nausea, fatigue and autoimmune considerations.  He is agreeable to continue at this time and we will update staging scans after cycle 4 of therapy.     2.  CNS staging: MRI of the brain obtained on September 16, 2022 did not show clear-cut metastatic disease.  We will update that scan in the next 3 to 6 months.   3.  T12 compression fracture: He remains asymptomatic and will be addressed with radiation or interventional radiology if needed in the future.   4.  Prognosis and goals of care: Therapy remains palliative although aggressive measures are warranted at this time.   5.  Autoimmune complications: These complications including pneumonitis, colitis and thyroid disease were reiterated.  He has not experienced any at this time.  6.  Anemia: Likely related to malignancy and possibly from hematuria.  We will arrange for 2 minutes of packed red cell transfusion in the near future.  7.  Follow-up: In 3 months for repeat follow-up.   60  minutes were spent on this encounter.  The time was dedicated to reviewing laboratory data, disease status update and addressing complications noted to cancer and cancer therapy.     Zola Button, MD 10/24/202311:25 AM

## 2022-10-12 NOTE — Patient Instructions (Signed)
[facility]COL[facility]  Discharge Instructions: Thank you for choosing [facility] to provide your oncology and hematology care.   If you have a lab appointment with the Cancer Center, please go directly to the Cancer Center and check in at the registration area.   Wear comfortable clothing and clothing appropriate for easy access to any Portacath or PICC line.   We strive to give you quality time with your provider. You may need to reschedule your appointment if you arrive late (15 or more minutes).  Arriving late affects you and other patients whose appointments are after yours.  Also, if you miss three or more appointments without notifying the office, you may be dismissed from the clinic at the provider's discretion.      For prescription refill requests, have your pharmacy contact our office and allow 72 hours for refills to be completed.    Today you received the following chemotherapy and/or immunotherapy agents Opdivo, Yervoy      To help prevent nausea and vomiting after your treatment, we encourage you to take your nausea medication as directed.  BELOW ARE SYMPTOMS THAT SHOULD BE REPORTED IMMEDIATELY: *FEVER GREATER THAN 100.4 F (38 C) OR HIGHER *CHILLS OR SWEATING *NAUSEA AND VOMITING THAT IS NOT CONTROLLED WITH YOUR NAUSEA MEDICATION *UNUSUAL SHORTNESS OF BREATH *UNUSUAL BRUISING OR BLEEDING *URINARY PROBLEMS (pain or burning when urinating, or frequent urination) *BOWEL PROBLEMS (unusual diarrhea, constipation, pain near the anus) TENDERNESS IN MOUTH AND THROAT WITH OR WITHOUT PRESENCE OF ULCERS (sore throat, sores in mouth, or a toothache) UNUSUAL RASH, SWELLING OR PAIN  UNUSUAL VAGINAL DISCHARGE OR ITCHING   Items with * indicate a potential emergency and should be followed up as soon as possible or go to the Emergency Department if any problems should occur.  Please show the CHEMOTHERAPY ALERT CARD or IMMUNOTHERAPY ALERT CARD at check-in  to the Emergency Department and triage nurse.  Should you have questions after your visit or need to cancel or reschedule your appointment, please contact [facility]  Dept: [phone]  and follow the prompts.  Office hours are 8:00 a.m. to 4:30 p.m. Monday - Friday. Please note that voicemails left after 4:00 p.m. may not be returned until the following business day.  We are closed weekends and major holidays. You have access to a nurse at all times for urgent questions. Please call the main number to the clinic Dept: [phone] and follow the prompts.   For any non-urgent questions, you may also contact your provider using MyChart. We now offer e-Visits for anyone 18 and older to request care online for non-urgent symptoms. For details visit [email or web address].   Also download the MyChart app! Go to the app store, search "MyChart", open the app, select , and log in with your MyChart username and password.  Masks are optional in the cancer centers. If you would like for your care team to wear a mask while they are taking care of you, please let them know. You may have one support Shilah Hefel who is at least 84 years old accompany you for your appointments. Ipilimumab Injection What is this medication? IPILIMUMAB (IP i LIM ue mab) treats some types of cancer. It works by helping your immune system slow or stop the spread of cancer cells. It is a monoclonal antibody. This medicine may be used for other purposes; ask your health care provider or pharmacist if you have questions. COMMON BRAND NAME(S): YERVOY What   should I tell my care team before I take this medication? They need to know if you have any of these conditions: Allogeneic stem cell transplant (uses someone else's stem cells) Autoimmune diseases, such as Crohn disease, ulcerative colitis, lupus Nervous system problems, such as Guillain-Barre syndrome or myasthenia gravis Organ transplant An  unusual or allergic reaction to ipilimumab, other medications, foods, dyes, or preservatives Pregnant or trying to get pregnant Breast-feeding How should I use this medication? This medication is infused into a vein. It is given by your care team in a hospital or clinic setting. A special MedGuide will be given to you before each treatment. Be sure to read this information carefully each time. Talk to your care team about the use of this medication in children. While it may be prescribed for children as young as 12 years for selected conditions, precautions do apply. Overdosage: If you think you have taken too much of this medicine contact a poison control center or emergency room at once. NOTE: This medicine is only for you. Do not share this medicine with others. What if I miss a dose? Keep appointments for follow-up doses. It is important not to miss your dose. Call your care team if you are unable to keep an appointment. What may interact with this medication? Interactions are not expected. This list may not describe all possible interactions. Give your health care provider a list of all the medicines, herbs, non-prescription drugs, or dietary supplements you use. Also tell them if you smoke, drink alcohol, or use illegal drugs. Some items may interact with your medicine. What should I watch for while using this medication? Your condition will be monitored carefully while you are receiving this medication. You may need blood work while taking this medication. This medication may cause serious skin reactions. They can happen weeks to months after starting the medication. Contact your care team right away if you notice fevers or flu-like symptoms with a rash. The rash may be red or purple and then turn into blisters or peeling of the skin. You may also notice a red rash with swelling of the face, lips, or lymph nodes in your neck or under your arms. Tell your care team right away if you have any  change in your eyesight. Talk to your care team if you may be pregnant. Serious birth defects can occur if you take this medication during pregnancy and for 3 months after the last dose. You will need a negative pregnancy test before starting this medication. Contraception is recommended while taking this medication and for 3 months after the last dose. Your care team can help you find the option that works for you. Do not breastfeed while taking this medication and for 3 months after the last dose. What side effects may I notice from receiving this medication? Side effects that you should report to your care team as soon as possible: Allergic reactions--skin rash, itching, hives, swelling of the face, lips, tongue, or throat Dry cough, shortness of breath or trouble breathing Eye pain, redness, irritation, or discharge with blurry or decreased vision Heart muscle inflammation--unusual weakness or fatigue, shortness of breath, chest pain, fast or irregular heartbeat, dizziness, swelling of the ankles, feet, or hands Hormone gland problems--headache, sensitivity to light, unusual weakness or fatigue, dizziness, fast or irregular heartbeat, increased sensitivity to cold or heat, excessive sweating, constipation, hair loss, increased thirst or amount of urine, tremors or shaking, irritability Infusion reactions--chest pain, shortness of breath or trouble breathing, feeling   faint or lightheaded Kidney injury (glomerulonephritis)--decrease in the amount of urine, red or dark brown urine, foamy or bubbly urine, swelling of the ankles, hands, or feet Liver injury--right upper belly pain, loss of appetite, nausea, light-colored stool, dark yellow or brown urine, yellowing skin or eyes, unusual weakness or fatigue Pain, tingling, or numbness in the hands or feet, muscle weakness, change in vision, confusion or trouble speaking, loss of balance or coordination, trouble walking, seizures Rash, fever, and swollen  lymph nodes Redness, blistering, peeling, or loosening of the skin, including inside the mouth Sudden or severe stomach pain, bloody diarrhea, fever, nausea, vomiting Side effects that usually do not require medical attention (report to your care team if they continue or are bothersome): Bone, joint, or muscle pain Diarrhea Fatigue Loss of appetite Nausea Skin rash This list may not describe all possible side effects. Call your doctor for medical advice about side effects. You may report side effects to FDA at 1-800-FDA-1088. Where should I keep my medication? This medication is given in a hospital or clinic. It will not be stored at home. NOTE: This sheet is a summary. It may not cover all possible information. If you have questions about this medicine, talk to your doctor, pharmacist, or health care provider.  2023 Elsevier/Gold Standard (2022-04-20 00:00:00) Nivolumab Injection What is this medication? NIVOLUMAB (nye VOL ue mab) treats some types of cancer. It works by helping your immune system slow or stop the spread of cancer cells. It is a monoclonal antibody. This medicine may be used for other purposes; ask your health care provider or pharmacist if you have questions. COMMON BRAND NAME(S): Opdivo What should I tell my care team before I take this medication? They need to know if you have any of these conditions: Allogeneic stem cell transplant (uses someone else's stem cells) Autoimmune diseases, such as Crohn disease, ulcerative colitis, lupus History of chest radiation Nervous system problems, such as Guillain-Barre syndrome or myasthenia gravis Organ transplant An unusual or allergic reaction to nivolumab, other medications, foods, dyes, or preservatives Pregnant or trying to get pregnant Breast-feeding How should I use this medication? This medication is infused into a vein. It is given in a hospital or clinic setting. A special MedGuide will be given to you before each  treatment. Be sure to read this information carefully each time. Talk to your care team about the use of this medication in children. While it may be prescribed for children as young as 12 years for selected conditions, precautions do apply. Overdosage: If you think you have taken too much of this medicine contact a poison control center or emergency room at once. NOTE: This medicine is only for you. Do not share this medicine with others. What if I miss a dose? Keep appointments for follow-up doses. It is important not to miss your dose. Call your care team if you are unable to keep an appointment. What may interact with this medication? Interactions have not been studied. This list may not describe all possible interactions. Give your health care provider a list of all the medicines, herbs, non-prescription drugs, or dietary supplements you use. Also tell them if you smoke, drink alcohol, or use illegal drugs. Some items may interact with your medicine. What should I watch for while using this medication? Your condition will be monitored carefully while you are receiving this medication. You may need blood work while taking this medication. This medication may cause serious skin reactions. They can happen weeks to months   after starting the medication. Contact your care team right away if you notice fevers or flu-like symptoms with a rash. The rash may be red or purple and then turn into blisters or peeling of the skin. You may also notice a red rash with swelling of the face, lips, or lymph nodes in your neck or under your arms. Tell your care team right away if you have any change in your eyesight. Talk to your care team if you are pregnant or think you might be pregnant. A negative pregnancy test is required before starting this medication. A reliable form of contraception is recommended while taking this medication and for 5 months after the last dose. Talk to your care team about effective forms  of contraception. Do not breast-feed while taking this medication and for 5 months after the last dose. What side effects may I notice from receiving this medication? Side effects that you should report to your care team as soon as possible: Allergic reactions--skin rash, itching, hives, swelling of the face, lips, tongue, or throat Dry cough, shortness of breath or trouble breathing Eye pain, redness, irritation, or discharge with blurry or decreased vision Heart muscle inflammation--unusual weakness or fatigue, shortness of breath, chest pain, fast or irregular heartbeat, dizziness, swelling of the ankles, feet, or hands Hormone gland problems--headache, sensitivity to light, unusual weakness or fatigue, dizziness, fast or irregular heartbeat, increased sensitivity to cold or heat, excessive sweating, constipation, hair loss, increased thirst or amount of urine, tremors or shaking, irritability Infusion reactions--chest pain, shortness of breath or trouble breathing, feeling faint or lightheaded Kidney injury (glomerulonephritis)--decrease in the amount of urine, red or dark brown urine, foamy or bubbly urine, swelling of the ankles, hands, or feet Liver injury--right upper belly pain, loss of appetite, nausea, light-colored stool, dark yellow or brown urine, yellowing skin or eyes, unusual weakness or fatigue Pain, tingling, or numbness in the hands or feet, muscle weakness, change in vision, confusion or trouble speaking, loss of balance or coordination, trouble walking, seizures Rash, fever, and swollen lymph nodes Redness, blistering, peeling, or loosening of the skin, including inside the mouth Sudden or severe stomach pain, bloody diarrhea, fever, nausea, vomiting Side effects that usually do not require medical attention (report these to your care team if they continue or are bothersome): Bone, joint, or muscle pain Diarrhea Fatigue Loss of appetite Nausea Skin rash This list may not  describe all possible side effects. Call your doctor for medical advice about side effects. You may report side effects to FDA at 1-800-FDA-1088. Where should I keep my medication? This medication is given in a hospital or clinic. It will not be stored at home. NOTE: This sheet is a summary. It may not cover all possible information. If you have questions about this medicine, talk to your doctor, pharmacist, or health care provider.  2023 Elsevier/Gold Standard (2021-11-06 00:00:00) 

## 2022-10-12 NOTE — Progress Notes (Signed)
Ipilimumab (YERVOY) Patient Monitoring Assessment   Is the patient experiencing any of the following general symptoms?:  '[X]'$ Difficulty performing normal activities '[X]'$ Feeling sluggish or cold all the time '[X]'$ Unusual weight gain '[X]'$ Constant or unusual headaches '[X]'$ Feeling dizzy or faint '[X]'$ Changes in eyesight (blurry vision, double vision, or other vision problems) '[X]'$ Changes in mood or behavior (ex: decreased sex drive, irritability, or forgetfulness) '[X]'$ Starting new medications (ex: steroids, other medications that lower immune response) '[X]'$ Patient is not experiencing any of the general symptoms above.   Gastrointestinal  Patient is having 1-2 bowel movements each day.  Is this different from baseline? '[ ]'$ Yes '[X]'$ No Are your stools watery or do they have Anthony foul smell? '[ ]'$ Yes '[X]'$ No Have you seen blood in your stools? '[ ]'$ Yes '[X]'$ No Are your stools dark, tarry, or sticky? '[ ]'$ Yes '[X]'$ No Are you having pain or tenderness in your belly? '[ ]'$ Yes '[X]'$ No   Skin Does your skin itch? '[ ]'$ Yes '[X]'$ No Do you have Anthony rash? '[ ]'$ Yes '[X]'$ No Has your skin blistered and/or peeled? '[ ]'$ Yes '[X]'$ No Do you have sores in your mouth? '[ ]'$ Yes '[X]'$ No  Hepatic Has your urine been dark or tea colored? '[ ]'$ Yes '[X]'$ No Have you noticed that your skin or the whites of your eyes are turning yellow? '[ ]'$ Yes '[X]'$ No Are you bleeding or bruising more easily than normal? '[ ]'$ Yes '[X]'$ No Are you nauseous and/or vomiting? '[ ]'$ Yes '[X]'$ No Do you have pain on the right side of your stomach? '[X]'$ Yes '[ ]'$ No Soreness constantly the last 3-4 days  Neurologic  Are you having unusual weakness of legs, arms, or face? '[ ]'$ Yes '[X]'$ No Are you having numbness or tingling in your hands or feet? '[ ]'$ Yes '[X]'$ No  Anthony Sheppard Anthony Sheppard

## 2022-10-12 NOTE — Progress Notes (Signed)
CRITICAL VALUE STICKER  CRITICAL VALUE: Hgb 6.7  RECEIVER (on-site recipient of call): Velna Ochs RN  DATE & TIME NOTIFIED: 10/12/22 @ 1128  MESSENGER (representative from lab): Pam  MD NOTIFIED: Dr Alen Blew  TIME OF NOTIFICATION: 1129  RESPONSE:  Patient to have chemo treatment today, to have 2 units PRBC's in the next 2 days

## 2022-10-13 ENCOUNTER — Inpatient Hospital Stay: Payer: Medicare PPO

## 2022-10-13 DIAGNOSIS — C649 Malignant neoplasm of unspecified kidney, except renal pelvis: Secondary | ICD-10-CM | POA: Diagnosis not present

## 2022-10-13 DIAGNOSIS — D649 Anemia, unspecified: Secondary | ICD-10-CM | POA: Diagnosis not present

## 2022-10-13 DIAGNOSIS — M4854XA Collapsed vertebra, not elsewhere classified, thoracic region, initial encounter for fracture: Secondary | ICD-10-CM | POA: Diagnosis not present

## 2022-10-13 DIAGNOSIS — Z79899 Other long term (current) drug therapy: Secondary | ICD-10-CM | POA: Diagnosis not present

## 2022-10-13 DIAGNOSIS — C7800 Secondary malignant neoplasm of unspecified lung: Secondary | ICD-10-CM | POA: Diagnosis not present

## 2022-10-13 DIAGNOSIS — Z5111 Encounter for antineoplastic chemotherapy: Secondary | ICD-10-CM | POA: Diagnosis not present

## 2022-10-13 MED ORDER — ACETAMINOPHEN 325 MG PO TABS
650.0000 mg | ORAL_TABLET | Freq: Once | ORAL | Status: AC
  Administered 2022-10-13: 650 mg via ORAL
  Filled 2022-10-13: qty 2

## 2022-10-13 MED ORDER — SODIUM CHLORIDE 0.9% IV SOLUTION
250.0000 mL | Freq: Once | INTRAVENOUS | Status: AC
  Administered 2022-10-13: 250 mL via INTRAVENOUS

## 2022-10-13 MED ORDER — DIPHENHYDRAMINE HCL 25 MG PO CAPS
25.0000 mg | ORAL_CAPSULE | Freq: Once | ORAL | Status: AC
  Administered 2022-10-13: 25 mg via ORAL
  Filled 2022-10-13: qty 1

## 2022-10-13 NOTE — Progress Notes (Unsigned)
Pt tolerated first time PBRC's. Attempted to educate pt on allergic reactions. Print out of AVS was given to pt instead and encouraged to read. Advised to call office with concerns. Pt appeared to verbalized understanding.

## 2022-10-13 NOTE — Patient Instructions (Signed)

## 2022-10-14 LAB — TYPE AND SCREEN
ABO/RH(D): O POS
Antibody Screen: NEGATIVE
Unit division: 0
Unit division: 0

## 2022-10-14 LAB — BPAM RBC
Blood Product Expiration Date: 202311232359
Blood Product Expiration Date: 202311232359
ISSUE DATE / TIME: 202310250935
ISSUE DATE / TIME: 202310250935
Unit Type and Rh: 5100
Unit Type and Rh: 5100

## 2022-10-31 ENCOUNTER — Other Ambulatory Visit: Payer: Self-pay

## 2022-11-02 ENCOUNTER — Ambulatory Visit: Payer: No Typology Code available for payment source

## 2022-11-02 ENCOUNTER — Ambulatory Visit: Payer: No Typology Code available for payment source | Admitting: Internal Medicine

## 2022-11-02 ENCOUNTER — Other Ambulatory Visit: Payer: No Typology Code available for payment source

## 2022-11-02 ENCOUNTER — Other Ambulatory Visit: Payer: Self-pay

## 2022-11-02 ENCOUNTER — Inpatient Hospital Stay: Payer: Medicare PPO

## 2022-11-02 ENCOUNTER — Inpatient Hospital Stay: Payer: Medicare PPO | Attending: Oncology

## 2022-11-02 ENCOUNTER — Inpatient Hospital Stay (HOSPITAL_BASED_OUTPATIENT_CLINIC_OR_DEPARTMENT_OTHER): Payer: Medicare PPO | Admitting: Oncology

## 2022-11-02 VITALS — BP 133/65 | HR 72 | Temp 98.1°F | Resp 17 | Ht 61.0 in | Wt 113.5 lb

## 2022-11-02 VITALS — BP 112/68 | HR 68 | Resp 20

## 2022-11-02 DIAGNOSIS — Z79899 Other long term (current) drug therapy: Secondary | ICD-10-CM | POA: Diagnosis not present

## 2022-11-02 DIAGNOSIS — C649 Malignant neoplasm of unspecified kidney, except renal pelvis: Secondary | ICD-10-CM | POA: Diagnosis not present

## 2022-11-02 DIAGNOSIS — C642 Malignant neoplasm of left kidney, except renal pelvis: Secondary | ICD-10-CM | POA: Diagnosis not present

## 2022-11-02 DIAGNOSIS — Z5111 Encounter for antineoplastic chemotherapy: Secondary | ICD-10-CM | POA: Insufficient documentation

## 2022-11-02 DIAGNOSIS — D649 Anemia, unspecified: Secondary | ICD-10-CM | POA: Insufficient documentation

## 2022-11-02 DIAGNOSIS — C7800 Secondary malignant neoplasm of unspecified lung: Secondary | ICD-10-CM | POA: Diagnosis not present

## 2022-11-02 LAB — CBC WITH DIFFERENTIAL (CANCER CENTER ONLY)
Abs Immature Granulocytes: 0.01 10*3/uL (ref 0.00–0.07)
Basophils Absolute: 0 10*3/uL (ref 0.0–0.1)
Basophils Relative: 0 %
Eosinophils Absolute: 0.5 10*3/uL (ref 0.0–0.5)
Eosinophils Relative: 9 %
HCT: 30.4 % — ABNORMAL LOW (ref 39.0–52.0)
Hemoglobin: 9.4 g/dL — ABNORMAL LOW (ref 13.0–17.0)
Immature Granulocytes: 0 %
Lymphocytes Relative: 19 %
Lymphs Abs: 1 10*3/uL (ref 0.7–4.0)
MCH: 25.5 pg — ABNORMAL LOW (ref 26.0–34.0)
MCHC: 30.9 g/dL (ref 30.0–36.0)
MCV: 82.6 fL (ref 80.0–100.0)
Monocytes Absolute: 0.6 10*3/uL (ref 0.1–1.0)
Monocytes Relative: 11 %
Neutro Abs: 3.2 10*3/uL (ref 1.7–7.7)
Neutrophils Relative %: 61 %
Platelet Count: 260 10*3/uL (ref 150–400)
RBC: 3.68 MIL/uL — ABNORMAL LOW (ref 4.22–5.81)
RDW: 16 % — ABNORMAL HIGH (ref 11.5–15.5)
WBC Count: 5.2 10*3/uL (ref 4.0–10.5)
nRBC: 0 % (ref 0.0–0.2)

## 2022-11-02 LAB — CMP (CANCER CENTER ONLY)
ALT: 7 U/L (ref 0–44)
AST: 12 U/L — ABNORMAL LOW (ref 15–41)
Albumin: 3.7 g/dL (ref 3.5–5.0)
Alkaline Phosphatase: 102 U/L (ref 38–126)
Anion gap: 6 (ref 5–15)
BUN: 29 mg/dL — ABNORMAL HIGH (ref 8–23)
CO2: 23 mmol/L (ref 22–32)
Calcium: 8.5 mg/dL — ABNORMAL LOW (ref 8.9–10.3)
Chloride: 105 mmol/L (ref 98–111)
Creatinine: 0.76 mg/dL (ref 0.61–1.24)
GFR, Estimated: >60 mL/min (ref >60)
Glucose, Bld: 111 mg/dL — ABNORMAL HIGH (ref 70–99)
Potassium: 4.6 mmol/L (ref 3.5–5.1)
Sodium: 134 mmol/L — ABNORMAL LOW (ref 135–145)
Total Bilirubin: 0.3 mg/dL (ref 0.3–1.2)
Total Protein: 7.8 g/dL (ref 6.5–8.1)

## 2022-11-02 LAB — TSH: TSH: 1.144 u[IU]/mL (ref 0.350–4.500)

## 2022-11-02 LAB — SAMPLE TO BLOOD BANK

## 2022-11-02 MED ORDER — SODIUM CHLORIDE 0.9 % IV SOLN
1.0000 mg/kg | Freq: Once | INTRAVENOUS | Status: AC
  Administered 2022-11-02: 50 mg via INTRAVENOUS
  Filled 2022-11-02: qty 10

## 2022-11-02 MED ORDER — FAMOTIDINE IN NACL 20-0.9 MG/50ML-% IV SOLN
20.0000 mg | Freq: Once | INTRAVENOUS | Status: AC
  Administered 2022-11-02: 20 mg via INTRAVENOUS
  Filled 2022-11-02: qty 50

## 2022-11-02 MED ORDER — DIPHENHYDRAMINE HCL 50 MG/ML IJ SOLN
25.0000 mg | Freq: Once | INTRAMUSCULAR | Status: AC
  Administered 2022-11-02: 25 mg via INTRAVENOUS
  Filled 2022-11-02: qty 1

## 2022-11-02 MED ORDER — SODIUM CHLORIDE 0.9 % IV SOLN: Freq: Once | INTRAVENOUS | Status: AC

## 2022-11-02 MED ORDER — SODIUM CHLORIDE 0.9 % IV SOLN
2.9700 mg/kg | Freq: Once | INTRAVENOUS | Status: AC
  Administered 2022-11-02: 160 mg via INTRAVENOUS
  Filled 2022-11-02: qty 16

## 2022-11-02 NOTE — Progress Notes (Signed)
Hematology and Oncology Follow Up Visit  Anthony Sheppard [id number] [birth year] 86 y.o. 11/02/2022 12:07 PM System, Provider Not Zella Richer, MD   Principle Diagnosis: 86 year old man with kidney cancer diagnosed in September 2023.  He was found to have stage IV, intermediate risk clear-cell renal carcinoma with abdominal adenopathy and pulmonary nodules in September 2023.     Prior Therapy: He is status post kidney biopsy completed on August 30, 2022.  This confirmed the presence of renal cell carcinoma.  Current therapy: Ipilimumab 1 mg/kg with nivolumab 3 mg/kg started September 21, 2022.  He is here for cycle 3 of therapy.  Interim History: Anthony Sheppard presents today for a follow-up evaluation.  Since last visit, he reports no major changes in his health.  He does report some occasional pruritus and mild fatigue which improved with the packed red cell transfusion.  He denies any nausea, vomiting or abdominal pain.  He denies any diarrhea or dyspnea on exertion.     Medications: Updated on review.  Current Outpatient Medications  Medication Sig Dispense Refill   acetaminophen-codeine 120-12 MG/5ML solution Take 10 mLs by mouth every 4 (four) hours as needed for moderate pain. 120 mL 0   ADVIL 200 MG CAPS Take 400 mg by mouth every 6 (six) hours as needed (for mild pain or headaches).     ALEVE 220 MG tablet Take 220-440 mg by mouth 2 (two) times daily as needed (for mild pain or headaches).     cetirizine (ZYRTEC) 10 MG tablet Take 10 mg by mouth at bedtime.     ciprofloxacin (CIPRO) 500 MG tablet Take 1 tablet (500 mg total) by mouth 2 (two) times daily. 14 tablet 0   finasteride (PROSCAR) 5 MG tablet Take 5 mg by mouth at bedtime.     Guaifenesin 1200 MG TB12 Take 1 tablet (1,200 mg total) by mouth 2 (two) times daily. 20 each 0   ibuprofen (ADVIL,MOTRIN) 800 MG tablet Take 1 tablet (800 mg total) by mouth every 8 (eight) hours as needed. 21 tablet 0   methocarbamol (ROBAXIN)  500 MG tablet Take 1 tablet (500 mg total) by mouth 2 (two) times daily. 20 tablet 0   prochlorperazine (COMPAZINE) 10 MG tablet Take 1 tablet (10 mg total) by mouth every 6 (six) hours as needed for nausea or vomiting. 30 tablet 1   simethicone (MYLICON) 80 MG chewable tablet Chew 160 mg by mouth 3 (three) times daily as needed for flatulence. (Patient not taking: Reported on 09/07/2022)     tamsulosin (FLOMAX) 0.4 MG CAPS capsule Take 0.4 mg by mouth at bedtime.      No current facility-administered medications for this visit.     Allergies:  Allergies  Allergen Reactions   Aspirin Nausea And Vomiting      Physical Exam: Blood pressure 133/65, pulse 72, temperature 98.1 F (36.7 C), temperature source Temporal, resp. rate 17, height '5\' 1"'$  (1.549 m), weight 113 lb 8 oz (51.5 kg), SpO2 98 %.  ECOG: 1    General appearance: Alert, awake without any distress. Head: Atraumatic without abnormalities Oropharynx: Without any thrush or ulcers. Eyes: No scleral icterus. Lymph nodes: No lymphadenopathy noted in the cervical, supraclavicular, or axillary nodes Heart:regular rate and rhythm, without any murmurs or gallops.   Lung: Clear to auscultation without any rhonchi, wheezes or dullness to percussion. Abdomin: Soft, nontender without any shifting dullness or ascites. Musculoskeletal: No clubbing or cyanosis. Neurological: No motor or sensory deficits. Skin: No rashes or  lesions.     Lab Results: Lab Results  Component Value Date   WBC 5.8 10/12/2022   HGB 6.7 (LL) 10/12/2022   HCT 22.3 (L) 10/12/2022   MCV 82.0 10/12/2022   PLT 375 10/12/2022     Chemistry      Component Value Date/Time   NA 137 10/12/2022 1105   K 4.0 10/12/2022 1105   CL 104 10/12/2022 1105   CO2 25 10/12/2022 1105   BUN 18 10/12/2022 1105   CREATININE 0.57 (L) 10/12/2022 1105      Component Value Date/Time   CALCIUM 8.5 (L) 10/12/2022 1105   ALKPHOS 73 10/12/2022 1105   AST 12 (L) 10/12/2022  1105   ALT 6 10/12/2022 1105   BILITOT 0.4 10/12/2022 1105         Impression and Plan:  86 year old with:   1.  Stage IV clear-cell renal cell carcinoma with metastatic disease including abdominal adenopathy and pulmonary nodules diagnosed in September 2023.   The natural course of his disease and treatment options were reviewed at this time.  He is currently on ipilimumab and nivolumab to complete 4 cycles of therapy if possible.  We will update his staging scan after that and determine best course of action accordingly.  Maintenance nivolumab versus adding oral targeted therapy could be considered.  He is agreeable to continue at this time.   2.  CNS staging: No evidence of metastatic disease noted in September 2023.   3.  T12 compression fracture: Treatment options including continued observation versus interventional radiology evaluation with kyphoplasty were reviewed.   4.  Prognosis and goals of care: His disease is incurable although aggressive measures are warranted.   5.  Autoimmune complications: I educated him about these complication clued pneumonitis, colitis and thyroid disease.  6.  Anemia: He received a packed red cell transfusion and hemoglobin is up to 9.4 today.  His anemia is likely related to malignancy.  7.  Follow-up: He will return in 3 weeks for the next cycle of therapy.   30  minutes were spent on this visit.  The time was dedicated to reviewing laboratory data, disease status update and outlining future plan of care discussion.     Zola Button, MD 11/14/202312:07 PM

## 2022-11-02 NOTE — Patient Instructions (Signed)
[facility]  Discharge Instructions: Thank you for choosing [facility] to provide your oncology and hematology care.   If you have a lab appointment with the [facility], please go directly to the [facility] and check in at the registration area.   Wear comfortable clothing and clothing appropriate for easy access to any Portacath or PICC line.   We strive to give you quality time with your provider. You may need to reschedule your appointment if you arrive late (15 or more minutes).  Arriving late affects you and other patients whose appointments are after yours.  Also, if you miss three or more appointments without notifying the office, you may be dismissed from the clinic at the provider's discretion.      For prescription refill requests, have your pharmacy contact our office and allow 72 hours for refills to be completed.    Today you received the following chemotherapy and/or immunotherapy agents Opdivo, Yervoy      To help prevent nausea and vomiting after your treatment, we encourage you to take your nausea medication as directed.  BELOW ARE SYMPTOMS THAT SHOULD BE REPORTED IMMEDIATELY: *FEVER GREATER THAN 100.4 F (38 C) OR HIGHER *CHILLS OR SWEATING *NAUSEA AND VOMITING THAT IS NOT CONTROLLED WITH YOUR NAUSEA MEDICATION *UNUSUAL SHORTNESS OF BREATH *UNUSUAL BRUISING OR BLEEDING *URINARY PROBLEMS (pain or burning when urinating, or frequent urination) *BOWEL PROBLEMS (unusual diarrhea, constipation, pain near the anus) TENDERNESS IN MOUTH AND THROAT WITH OR WITHOUT PRESENCE OF ULCERS (sore throat, sores in mouth, or a toothache) UNUSUAL RASH, SWELLING OR PAIN  UNUSUAL VAGINAL DISCHARGE OR ITCHING   Items with * indicate a potential emergency and should be followed up as soon as possible or go to the Emergency Department if any problems should occur.  Please show the CHEMOTHERAPY ALERT CARD or IMMUNOTHERAPY ALERT CARD at check-in  to the Emergency Department and triage nurse.  Should you have questions after your visit or need to cancel or reschedule your appointment, please contact [facility]  Dept: [phone]  and follow the prompts.  Office hours are 8:00 a.m. to 4:30 p.m. Monday - Friday. Please note that voicemails left after 4:00 p.m. may not be returned until the following business day.  We are closed weekends and major holidays. You have access to a nurse at all times for urgent questions. Please call the main number to the clinic Dept: [phone] and follow the prompts.   For any non-urgent questions, you may also contact your provider using MyChart. We now offer e-Visits for anyone 51 and older to request care online for non-urgent symptoms. For details visit [email or web address].   Also download the MyChart app! Go to the app store, search "MyChart", open the app, select [facility], and log in with your MyChart username and password.  Masks are optional in the cancer centers. If you would like for your care team to wear a mask while they are taking care of you, please let them know. You may have one support person who is at least 84 years old accompany you for your appointments. Ipilimumab Injection What is this medication? IPILIMUMAB (IP i LIM ue mab) treats some types of cancer. It works by helping your immune system slow or stop the spread of cancer cells. It is a monoclonal antibody. This medicine may be used for other purposes; ask your health care provider or pharmacist if you have questions. COMMON BRAND NAME(S): YERVOY What  should I tell my care team before I take this medication? They need to know if you have any of these conditions: Allogeneic stem cell transplant (uses someone else's stem cells) Autoimmune diseases, such as Crohn disease, ulcerative colitis, lupus Nervous system problems, such as Guillain-Barre syndrome or myasthenia gravis Organ transplant An  unusual or allergic reaction to ipilimumab, other medications, foods, dyes, or preservatives Pregnant or trying to get pregnant Breast-feeding How should I use this medication? This medication is infused into a vein. It is given by your care team in a hospital or clinic setting. A special MedGuide will be given to you before each treatment. Be sure to read this information carefully each time. Talk to your care team about the use of this medication in children. While it may be prescribed for children as young as 12 years for selected conditions, precautions do apply. Overdosage: If you think you have taken too much of this medicine contact a poison control center or emergency room at once. NOTE: This medicine is only for you. Do not share this medicine with others. What if I miss a dose? Keep appointments for follow-up doses. It is important not to miss your dose. Call your care team if you are unable to keep an appointment. What may interact with this medication? Interactions are not expected. This list may not describe all possible interactions. Give your health care provider a list of all the medicines, herbs, non-prescription drugs, or dietary supplements you use. Also tell them if you smoke, drink alcohol, or use illegal drugs. Some items may interact with your medicine. What should I watch for while using this medication? Your condition will be monitored carefully while you are receiving this medication. You may need blood work while taking this medication. This medication may cause serious skin reactions. They can happen weeks to months after starting the medication. Contact your care team right away if you notice fevers or flu-like symptoms with a rash. The rash may be red or purple and then turn into blisters or peeling of the skin. You may also notice a red rash with swelling of the face, lips, or lymph nodes in your neck or under your arms. Tell your care team right away if you have any  change in your eyesight. Talk to your care team if you may be pregnant. Serious birth defects can occur if you take this medication during pregnancy and for 3 months after the last dose. You will need a negative pregnancy test before starting this medication. Contraception is recommended while taking this medication and for 3 months after the last dose. Your care team can help you find the option that works for you. Do not breastfeed while taking this medication and for 3 months after the last dose. What side effects may I notice from receiving this medication? Side effects that you should report to your care team as soon as possible: Allergic reactions--skin rash, itching, hives, swelling of the face, lips, tongue, or throat Dry cough, shortness of breath or trouble breathing Eye pain, redness, irritation, or discharge with blurry or decreased vision Heart muscle inflammation--unusual weakness or fatigue, shortness of breath, chest pain, fast or irregular heartbeat, dizziness, swelling of the ankles, feet, or hands Hormone gland problems--headache, sensitivity to light, unusual weakness or fatigue, dizziness, fast or irregular heartbeat, increased sensitivity to cold or heat, excessive sweating, constipation, hair loss, increased thirst or amount of urine, tremors or shaking, irritability Infusion reactions--chest pain, shortness of breath or trouble breathing, feeling  faint or lightheaded Kidney injury (glomerulonephritis)--decrease in the amount of urine, red or dark brown urine, foamy or bubbly urine, swelling of the ankles, hands, or feet Liver injury--right upper belly pain, loss of appetite, nausea, light-colored stool, dark yellow or brown urine, yellowing skin or eyes, unusual weakness or fatigue Pain, tingling, or numbness in the hands or feet, muscle weakness, change in vision, confusion or trouble speaking, loss of balance or coordination, trouble walking, seizures Rash, fever, and swollen  lymph nodes Redness, blistering, peeling, or loosening of the skin, including inside the mouth Sudden or severe stomach pain, bloody diarrhea, fever, nausea, vomiting Side effects that usually do not require medical attention (report to your care team if they continue or are bothersome): Bone, joint, or muscle pain Diarrhea Fatigue Loss of appetite Nausea Skin rash This list may not describe all possible side effects. Call your doctor for medical advice about side effects. You may report side effects to FDA at 1-800-FDA-1088. Where should I keep my medication? This medication is given in a hospital or clinic. It will not be stored at home. NOTE: This sheet is a summary. It may not cover all possible information. If you have questions about this medicine, talk to your doctor, pharmacist, or health care provider.  2023 Elsevier/Gold Standard (2022-04-20 00:00:00) Nivolumab Injection What is this medication? NIVOLUMAB (nye VOL ue mab) treats some types of cancer. It works by helping your immune system slow or stop the spread of cancer cells. It is a monoclonal antibody. This medicine may be used for other purposes; ask your health care provider or pharmacist if you have questions. COMMON BRAND NAME(S): Opdivo What should I tell my care team before I take this medication? They need to know if you have any of these conditions: Allogeneic stem cell transplant (uses someone else's stem cells) Autoimmune diseases, such as Crohn disease, ulcerative colitis, lupus History of chest radiation Nervous system problems, such as Guillain-Barre syndrome or myasthenia gravis Organ transplant An unusual or allergic reaction to nivolumab, other medications, foods, dyes, or preservatives Pregnant or trying to get pregnant Breast-feeding How should I use this medication? This medication is infused into a vein. It is given in a hospital or clinic setting. A special MedGuide will be given to you before each  treatment. Be sure to read this information carefully each time. Talk to your care team about the use of this medication in children. While it may be prescribed for children as young as 12 years for selected conditions, precautions do apply. Overdosage: If you think you have taken too much of this medicine contact a poison control center or emergency room at once. NOTE: This medicine is only for you. Do not share this medicine with others. What if I miss a dose? Keep appointments for follow-up doses. It is important not to miss your dose. Call your care team if you are unable to keep an appointment. What may interact with this medication? Interactions have not been studied. This list may not describe all possible interactions. Give your health care provider a list of all the medicines, herbs, non-prescription drugs, or dietary supplements you use. Also tell them if you smoke, drink alcohol, or use illegal drugs. Some items may interact with your medicine. What should I watch for while using this medication? Your condition will be monitored carefully while you are receiving this medication. You may need blood work while taking this medication. This medication may cause serious skin reactions. They can happen weeks to months  after starting the medication. Contact your care team right away if you notice fevers or flu-like symptoms with a rash. The rash may be red or purple and then turn into blisters or peeling of the skin. You may also notice a red rash with swelling of the face, lips, or lymph nodes in your neck or under your arms. Tell your care team right away if you have any change in your eyesight. Talk to your care team if you are pregnant or think you might be pregnant. A negative pregnancy test is required before starting this medication. A reliable form of contraception is recommended while taking this medication and for 5 months after the last dose. Talk to your care team about effective forms  of contraception. Do not breast-feed while taking this medication and for 5 months after the last dose. What side effects may I notice from receiving this medication? Side effects that you should report to your care team as soon as possible: Allergic reactions--skin rash, itching, hives, swelling of the face, lips, tongue, or throat Dry cough, shortness of breath or trouble breathing Eye pain, redness, irritation, or discharge with blurry or decreased vision Heart muscle inflammation--unusual weakness or fatigue, shortness of breath, chest pain, fast or irregular heartbeat, dizziness, swelling of the ankles, feet, or hands Hormone gland problems--headache, sensitivity to light, unusual weakness or fatigue, dizziness, fast or irregular heartbeat, increased sensitivity to cold or heat, excessive sweating, constipation, hair loss, increased thirst or amount of urine, tremors or shaking, irritability Infusion reactions--chest pain, shortness of breath or trouble breathing, feeling faint or lightheaded Kidney injury (glomerulonephritis)--decrease in the amount of urine, red or dark brown urine, foamy or bubbly urine, swelling of the ankles, hands, or feet Liver injury--right upper belly pain, loss of appetite, nausea, light-colored stool, dark yellow or brown urine, yellowing skin or eyes, unusual weakness or fatigue Pain, tingling, or numbness in the hands or feet, muscle weakness, change in vision, confusion or trouble speaking, loss of balance or coordination, trouble walking, seizures Rash, fever, and swollen lymph nodes Redness, blistering, peeling, or loosening of the skin, including inside the mouth Sudden or severe stomach pain, bloody diarrhea, fever, nausea, vomiting Side effects that usually do not require medical attention (report these to your care team if they continue or are bothersome): Bone, joint, or muscle pain Diarrhea Fatigue Loss of appetite Nausea Skin rash This list may not  describe all possible side effects. Call your doctor for medical advice about side effects. You may report side effects to FDA at 1-800-FDA-1088. Where should I keep my medication? This medication is given in a hospital or clinic. It will not be stored at home. NOTE: This sheet is a summary. It may not cover all possible information. If you have questions about this medicine, talk to your doctor, pharmacist, or health care provider.  2023 Elsevier/Gold Standard (2021-11-06 00:00:00)

## 2022-11-02 NOTE — Progress Notes (Signed)
Ipilimumab (YERVOY) Patient Monitoring Assessment   Is the patient experiencing any of the following general symptoms?:  '[x]'$ Patient is not experiencing any of the general symptoms listed in this section.  '[]'$ Difficulty performing normal activities '[]'$ Feeling sluggish or cold all the time '[]'$ Unusual weight gain '[]'$ Constant or unusual headaches '[]'$ Feeling dizzy or faint '[]'$ Changes in eyesight (blurry vision, double vision, or other vision problems) '[]'$ Changes in mood or behavior (ex: decreased sex drive, irritability, or forgetfulness) '[]'$ Starting new medications (ex: steroids, other medications that lower immune response)   Gastrointestinal  Patient is having 1 bowel movements each day.  Is this different from baseline? '[]'$ Yes '[x]'$ No Are your stools watery or do they have a foul smell? '[]'$ Yes '[]'$ No Have you seen blood in your stools? '[]'$ Yes '[]'$ No Are your stools dark, tarry, or sticky? '[]'$ Yes '[]'$ No Are you having pain or tenderness in your belly? '[]'$ Yes '[]'$ No  Skin Does your skin itch? '[x]'$ Yes '[]'$ No Do you have a rash? '[]'$ Yes '[x]'$ No Has your skin blistered and/or peeled? '[]'$ Yes '[x]'$ No Do you have sores in your mouth? '[]'$ Yes '[x]'$ No  Hepatic Has your urine been dark or tea colored? '[]'$ Yes '[x]'$ No Have you noticed your skin or the whites of your eyes are turning yellow? '[]'$ Yes '[]'$ No Are you bleeding or bruising more easily than normal? '[]'$ Yes '[]'$ No Are you nauseous and/or vomiting? '[]'$ Yes '[]'$ No Do you have pain on the right side of your stomach? '[]'$ Yes '[]'$ No  Neurologic  Are you having unusual weakness of legs, arms, or face? '[]'$ Yes '[x]'$ No Are you having numbness or tingling in your hands or feet? '[]'$ Yes '[x]'$ No  Tildon Husky

## 2022-11-06 ENCOUNTER — Other Ambulatory Visit: Payer: Self-pay

## 2022-11-09 LAB — T4: T4, Total: 5.7 ug/dL (ref 4.5–12.0)

## 2022-11-13 ENCOUNTER — Other Ambulatory Visit: Payer: Self-pay

## 2022-11-18 ENCOUNTER — Other Ambulatory Visit: Payer: Self-pay

## 2022-11-23 ENCOUNTER — Inpatient Hospital Stay (HOSPITAL_BASED_OUTPATIENT_CLINIC_OR_DEPARTMENT_OTHER): Payer: Medicare PPO | Admitting: Oncology

## 2022-11-23 ENCOUNTER — Ambulatory Visit: Payer: No Typology Code available for payment source

## 2022-11-23 ENCOUNTER — Inpatient Hospital Stay: Payer: Medicare PPO

## 2022-11-23 ENCOUNTER — Inpatient Hospital Stay: Payer: Medicare PPO | Attending: Oncology

## 2022-11-23 ENCOUNTER — Ambulatory Visit: Payer: No Typology Code available for payment source | Admitting: Internal Medicine

## 2022-11-23 ENCOUNTER — Other Ambulatory Visit: Payer: Self-pay

## 2022-11-23 ENCOUNTER — Other Ambulatory Visit: Payer: No Typology Code available for payment source

## 2022-11-23 DIAGNOSIS — D630 Anemia in neoplastic disease: Secondary | ICD-10-CM | POA: Diagnosis not present

## 2022-11-23 DIAGNOSIS — C649 Malignant neoplasm of unspecified kidney, except renal pelvis: Secondary | ICD-10-CM | POA: Diagnosis not present

## 2022-11-23 DIAGNOSIS — Z5111 Encounter for antineoplastic chemotherapy: Secondary | ICD-10-CM | POA: Diagnosis not present

## 2022-11-23 DIAGNOSIS — Z79899 Other long term (current) drug therapy: Secondary | ICD-10-CM | POA: Insufficient documentation

## 2022-11-23 DIAGNOSIS — C642 Malignant neoplasm of left kidney, except renal pelvis: Secondary | ICD-10-CM

## 2022-11-23 DIAGNOSIS — C7800 Secondary malignant neoplasm of unspecified lung: Secondary | ICD-10-CM | POA: Insufficient documentation

## 2022-11-23 LAB — CMP (CANCER CENTER ONLY)
ALT: 7 U/L (ref 0–44)
AST: 11 U/L — ABNORMAL LOW (ref 15–41)
Albumin: 3.5 g/dL (ref 3.5–5.0)
Alkaline Phosphatase: 94 U/L (ref 38–126)
Anion gap: 5 (ref 5–15)
BUN: 21 mg/dL (ref 8–23)
CO2: 26 mmol/L (ref 22–32)
Calcium: 8.9 mg/dL (ref 8.9–10.3)
Chloride: 106 mmol/L (ref 98–111)
Creatinine: 0.68 mg/dL (ref 0.61–1.24)
GFR, Estimated: >60 mL/min (ref >60)
Glucose, Bld: 119 mg/dL — ABNORMAL HIGH (ref 70–99)
Potassium: 4.5 mmol/L (ref 3.5–5.1)
Sodium: 137 mmol/L (ref 135–145)
Total Bilirubin: 0.3 mg/dL (ref 0.3–1.2)
Total Protein: 7.4 g/dL (ref 6.5–8.1)

## 2022-11-23 LAB — CBC WITH DIFFERENTIAL (CANCER CENTER ONLY)
Abs Immature Granulocytes: 0.02 10*3/uL (ref 0.00–0.07)
Basophils Absolute: 0 10*3/uL (ref 0.0–0.1)
Basophils Relative: 1 %
Eosinophils Absolute: 0.6 10*3/uL — ABNORMAL HIGH (ref 0.0–0.5)
Eosinophils Relative: 8 %
HCT: 31 % — ABNORMAL LOW (ref 39.0–52.0)
Hemoglobin: 9.7 g/dL — ABNORMAL LOW (ref 13.0–17.0)
Immature Granulocytes: 0 %
Lymphocytes Relative: 15 %
Lymphs Abs: 1 10*3/uL (ref 0.7–4.0)
MCH: 26.1 pg (ref 26.0–34.0)
MCHC: 31.3 g/dL (ref 30.0–36.0)
MCV: 83.3 fL (ref 80.0–100.0)
Monocytes Absolute: 0.7 10*3/uL (ref 0.1–1.0)
Monocytes Relative: 10 %
Neutro Abs: 4.5 10*3/uL (ref 1.7–7.7)
Neutrophils Relative %: 66 %
Platelet Count: 263 10*3/uL (ref 150–400)
RBC: 3.72 MIL/uL — ABNORMAL LOW (ref 4.22–5.81)
RDW: 17.3 % — ABNORMAL HIGH (ref 11.5–15.5)
WBC Count: 6.8 10*3/uL (ref 4.0–10.5)
nRBC: 0 % (ref 0.0–0.2)

## 2022-11-23 MED ORDER — SODIUM CHLORIDE 0.9 % IV SOLN
1.0000 mg/kg | Freq: Once | INTRAVENOUS | Status: AC
  Administered 2022-11-23: 50 mg via INTRAVENOUS
  Filled 2022-11-23: qty 10

## 2022-11-23 MED ORDER — SODIUM CHLORIDE 0.9 % IV SOLN: Freq: Once | INTRAVENOUS | Status: AC

## 2022-11-23 MED ORDER — SODIUM CHLORIDE 0.9 % IV SOLN
2.9700 mg/kg | Freq: Once | INTRAVENOUS | Status: AC
  Administered 2022-11-23: 160 mg via INTRAVENOUS
  Filled 2022-11-23: qty 16

## 2022-11-23 MED ORDER — DIPHENHYDRAMINE HCL 50 MG/ML IJ SOLN
25.0000 mg | Freq: Once | INTRAMUSCULAR | Status: AC
  Administered 2022-11-23: 25 mg via INTRAVENOUS
  Filled 2022-11-23: qty 1

## 2022-11-23 MED ORDER — FAMOTIDINE IN NACL 20-0.9 MG/50ML-% IV SOLN
20.0000 mg | Freq: Once | INTRAVENOUS | Status: AC
  Administered 2022-11-23: 20 mg via INTRAVENOUS
  Filled 2022-11-23: qty 50

## 2022-11-23 NOTE — Progress Notes (Signed)
Ipilimumab (YERVOY) Patient Monitoring Assessment   Is the patient experiencing any of the following general symptoms?:  '[ ]'$ Difficulty performing normal activities '[ ]'$ Feeling sluggish or cold all the time '[ ]'$ Unusual weight gain '[ ]'$ Constant or unusual headaches '[ ]'$ Feeling dizzy or faint '[ ]'$ Changes in eyesight (blurry vision, double vision, or other vision problems) '[ ]'$ Changes in mood or behavior (ex: decreased sex drive, irritability, or forgetfulness) '[ ]'$ Starting new medications (ex: steroids, other medications that lower immune response) [ X]Patient is not experiencing any of the general symptoms above.   Gastrointestinal  Patient is having 1-2 bowel movements each day.  Is this different from baseline? '[ ]'$ Yes [ X]No Are your stools watery or do they have a foul smell? '[ ]'$ Yes '[ ]'$ XNo Have you seen blood in your stools? '[ ]'$ Yes [ X]No Are your stools dark, tarry, or sticky? '[ ]'$ Yes [ X]No Are you having pain or tenderness in your belly? '[ ]'$ Yes [ X]No  Skin Does your skin itch? '[ ]'$ Yes [ X]No Do you have a rash? '[ ]'$ Yes '[ ]'$ No Has your skin blistered and/oXr peeled? '[ ]'$ Yes [ X]No Do you have sores in your mouth? '[ ]'$ Yes [ X]No  Hepatic Has your urine been dark or tea colored? '[ ]'$ Yes [X ]No Have you noticed that your skin or the whites of your eyes are turning yellow? '[ ]'$ Yes [ X]No Are you bleeding or bruising more easily than normal? '[ ]'$ Yes [ X]No Are you nauseous and/or vomiting? '[ ]'$ Yes [ X]No Do you have pain on the right side of your stomach? '[ ]'$ Yes [ X]No  Neurologic  Are you having unusual weakness of legs, arms, or face? '[ ]'$ Yes [ X]No Are you having numbness or tingling in your hands or feet? '[ ]'$ Yes [X ]No  Anthony Sheppard

## 2022-11-23 NOTE — Progress Notes (Signed)
Hematology and Oncology Follow Up Visit  Anthony Sheppard [id number] [birth year] 86 y.o. 11/23/2022 11:45 AM System, Provider Not Anthony Richer, MD   Principle Diagnosis: 86 year old man with stage IV, intermediate risk clear-cell renal carcinoma with abdominal adenopathy and pulmonary nodules diagnosed in September 2023.     Prior Therapy: He is status post kidney biopsy completed on August 30, 2022.  This confirmed the presence of renal cell carcinoma.  Current therapy: Ipilimumab 1 mg/kg with nivolumab 3 mg/kg started September 21, 2022.  He is here for cycle 4 of therapy.  Interim History: Anthony Sheppard returns today for a repeat follow-up visit.  Since the last visit, he reports feeling well without any major complaints.  He denies any nausea, vomiting or abdominal pain.  He denies any recent hospitalizations.  He denies any cough or shortness of breath.  His performance status remains reasonable at this time without any decline.     Medications: Reviewed without changes. Current Outpatient Medications  Medication Sig Dispense Refill   acetaminophen-codeine 120-12 MG/5ML solution Take 10 mLs by mouth every 4 (four) hours as needed for moderate pain. 120 mL 0   ADVIL 200 MG CAPS Take 400 mg by mouth every 6 (six) hours as needed (for mild pain or headaches).     ALEVE 220 MG tablet Take 220-440 mg by mouth 2 (two) times daily as needed (for mild pain or headaches).     cetirizine (ZYRTEC) 10 MG tablet Take 10 mg by mouth at bedtime.     ciprofloxacin (CIPRO) 500 MG tablet Take 1 tablet (500 mg total) by mouth 2 (two) times daily. 14 tablet 0   finasteride (PROSCAR) 5 MG tablet Take 5 mg by mouth at bedtime.     Guaifenesin 1200 MG TB12 Take 1 tablet (1,200 mg total) by mouth 2 (two) times daily. 20 each 0   ibuprofen (ADVIL,MOTRIN) 800 MG tablet Take 1 tablet (800 mg total) by mouth every 8 (eight) hours as needed. 21 tablet 0   methocarbamol (ROBAXIN) 500 MG tablet Take 1 tablet (500  mg total) by mouth 2 (two) times daily. 20 tablet 0   prochlorperazine (COMPAZINE) 10 MG tablet Take 1 tablet (10 mg total) by mouth every 6 (six) hours as needed for nausea or vomiting. 30 tablet 1   simethicone (MYLICON) 80 MG chewable tablet Chew 160 mg by mouth 3 (three) times daily as needed for flatulence. (Patient not taking: Reported on 09/07/2022)     tamsulosin (FLOMAX) 0.4 MG CAPS capsule Take 0.4 mg by mouth at bedtime.      No current facility-administered medications for this visit.     Allergies:  Allergies  Allergen Reactions   Aspirin Nausea And Vomiting      Physical Exam: Blood pressure 139/77, pulse 72, temperature 97.9 F (36.6 C), temperature source Temporal, resp. rate 16, height '5\' 1"'$  (1.549 m), weight 119 lb 4.8 oz (54.1 kg), SpO2 98 %.   ECOG: 1   General appearance: Comfortable appearing without any discomfort Head: Normocephalic without any trauma Oropharynx: Mucous membranes are moist and pink without any thrush or ulcers. Eyes: Pupils are equal and round reactive to light. Lymph nodes: No cervical, supraclavicular, inguinal or axillary lymphadenopathy.   Heart:regular rate and rhythm.  S1 and S2 without leg edema. Lung: Clear without any rhonchi or wheezes.  No dullness to percussion. Abdomin: Soft, nontender, nondistended with good bowel sounds.  No hepatosplenomegaly. Musculoskeletal: No joint deformity or effusion.  Full range of motion noted.  Neurological: No deficits noted on motor, sensory and deep tendon reflex exam. Skin: No petechial rash or dryness.  Appeared moist.      Lab Results: Lab Results  Component Value Date   WBC 6.8 11/23/2022   HGB 9.7 (L) 11/23/2022   HCT 31.0 (L) 11/23/2022   MCV 83.3 11/23/2022   PLT 263 11/23/2022     Chemistry      Component Value Date/Time   NA 134 (L) 11/02/2022 1202   K 4.6 11/02/2022 1202   CL 105 11/02/2022 1202   CO2 23 11/02/2022 1202   BUN 29 (H) 11/02/2022 1202   CREATININE 0.76  11/02/2022 1202      Component Value Date/Time   CALCIUM 8.5 (L) 11/02/2022 1202   ALKPHOS 102 11/02/2022 1202   AST 12 (L) 11/02/2022 1202   ALT 7 11/02/2022 1202   BILITOT 0.3 11/02/2022 1202         Impression and Plan:  86 year old with:   1.  Kidney cancer diagnosed in September 2023.  He was found to have stage IV clear-cell with metastatic disease including abdominal adenopathy and pulmonary nodules.   Risks and benefits of continuing this treatment were discussed at this time.  Complications including autoimmune considerations, GI toxicity among others were reiterated.  Alternative treatment options including oral targeted therapy with cabozantinib or axitinib were discussed.  I have recommended repeating imaging studies upon the conclusion of cycle 4.  Proceeding with nivolumab maintenance versus switching to a different therapy will be depending on the results of the scan.  He is agreeable to proceed with treatment and we will update his imaging before the next visit.   2.  CNS staging: This will be repeated in the future as needed.  Imaging studies of the brain and September 2023 were within normal range.   3.  T12 compression fracture: Augmentation procedure could be considered if he becomes symptomatic.   4.  Prognosis and goals of care: Therapy remains palliative though aggressive measures are warranted at this time.   5.  Autoimmune complications: These complications including pneumonitis, colitis and thyroid disease among others.  Will continue to reiterate and address these issues.  6.  Anemia: Related to malignancy and treatment.  Hemoglobin improved after transfusion.  7.  Follow-up: In 3 weeks for the next cycle of therapy.   30  minutes were dedicated to this encounter.  Time was spent on updating disease status, treatment choices and addressing complication related to cancer and cancer therapy.   Anthony Button, MD 12/5/202311:45 AM

## 2022-12-01 ENCOUNTER — Telehealth: Payer: Self-pay | Admitting: *Deleted

## 2022-12-01 NOTE — Telephone Encounter (Signed)
PC to patient, spoke with his family member [name] - informed her his PET scan has been authorized by insurance & may be scheduled by [employer], [phone].  She verbalizes understanding & will schedule PET.

## 2022-12-02 ENCOUNTER — Ambulatory Visit (HOSPITAL_COMMUNITY)
Admission: RE | Admit: 2022-12-02 | Discharge: 2022-12-02 | Disposition: A | Discharge status: Home / Self Care | Payer: Medicare PPO | Source: Ambulatory Visit | Attending: Oncology | Admitting: Oncology

## 2022-12-02 DIAGNOSIS — C642 Malignant neoplasm of left kidney, except renal pelvis: Secondary | ICD-10-CM | POA: Diagnosis not present

## 2022-12-02 LAB — GLUCOSE, CAPILLARY: Glucose-Capillary: 80 mg/dL (ref 70–99)

## 2022-12-02 MED ORDER — FLUDEOXYGLUCOSE F - 18 (FDG) INJECTION
5.9000 | Freq: Once | INTRAVENOUS | Status: AC
  Administered 2022-12-02: 5.8 via INTRAVENOUS

## 2022-12-07 DIAGNOSIS — N401 Enlarged prostate with lower urinary tract symptoms: Secondary | ICD-10-CM | POA: Diagnosis not present

## 2022-12-07 DIAGNOSIS — R3915 Urgency of urination: Secondary | ICD-10-CM | POA: Diagnosis not present

## 2022-12-07 DIAGNOSIS — D49512 Neoplasm of unspecified behavior of left kidney: Secondary | ICD-10-CM | POA: Diagnosis not present

## 2022-12-07 DIAGNOSIS — R972 Elevated prostate specific antigen [PSA]: Secondary | ICD-10-CM | POA: Diagnosis not present

## 2022-12-14 ENCOUNTER — Other Ambulatory Visit: Payer: Self-pay

## 2022-12-14 ENCOUNTER — Inpatient Hospital Stay: Payer: Medicare PPO

## 2022-12-14 ENCOUNTER — Inpatient Hospital Stay (HOSPITAL_BASED_OUTPATIENT_CLINIC_OR_DEPARTMENT_OTHER): Payer: Medicare PPO | Admitting: Oncology

## 2022-12-14 VITALS — BP 115/65 | HR 72 | Resp 16

## 2022-12-14 VITALS — BP 125/64 | HR 67 | Temp 98.2°F | Resp 15 | Wt 124.9 lb

## 2022-12-14 DIAGNOSIS — Z79899 Other long term (current) drug therapy: Secondary | ICD-10-CM | POA: Diagnosis not present

## 2022-12-14 DIAGNOSIS — C642 Malignant neoplasm of left kidney, except renal pelvis: Secondary | ICD-10-CM

## 2022-12-14 DIAGNOSIS — D649 Anemia, unspecified: Secondary | ICD-10-CM

## 2022-12-14 DIAGNOSIS — C649 Malignant neoplasm of unspecified kidney, except renal pelvis: Secondary | ICD-10-CM | POA: Diagnosis not present

## 2022-12-14 DIAGNOSIS — D630 Anemia in neoplastic disease: Secondary | ICD-10-CM | POA: Diagnosis not present

## 2022-12-14 DIAGNOSIS — C7800 Secondary malignant neoplasm of unspecified lung: Secondary | ICD-10-CM | POA: Diagnosis not present

## 2022-12-14 DIAGNOSIS — Z5111 Encounter for antineoplastic chemotherapy: Secondary | ICD-10-CM | POA: Diagnosis not present

## 2022-12-14 LAB — CBC WITH DIFFERENTIAL (CANCER CENTER ONLY)
Abs Immature Granulocytes: 0.01 10*3/uL (ref 0.00–0.07)
Basophils Absolute: 0 10*3/uL (ref 0.0–0.1)
Basophils Relative: 0 %
Eosinophils Absolute: 0.6 10*3/uL — ABNORMAL HIGH (ref 0.0–0.5)
Eosinophils Relative: 9 %
HCT: 31.3 % — ABNORMAL LOW (ref 39.0–52.0)
Hemoglobin: 10 g/dL — ABNORMAL LOW (ref 13.0–17.0)
Immature Granulocytes: 0 %
Lymphocytes Relative: 17 %
Lymphs Abs: 1.1 10*3/uL (ref 0.7–4.0)
MCH: 26.5 pg (ref 26.0–34.0)
MCHC: 31.9 g/dL (ref 30.0–36.0)
MCV: 83 fL (ref 80.0–100.0)
Monocytes Absolute: 0.8 10*3/uL (ref 0.1–1.0)
Monocytes Relative: 12 %
Neutro Abs: 4 10*3/uL (ref 1.7–7.7)
Neutrophils Relative %: 62 %
Platelet Count: 237 10*3/uL (ref 150–400)
RBC: 3.77 MIL/uL — ABNORMAL LOW (ref 4.22–5.81)
RDW: 17.2 % — ABNORMAL HIGH (ref 11.5–15.5)
WBC Count: 6.4 10*3/uL (ref 4.0–10.5)
nRBC: 0 % (ref 0.0–0.2)

## 2022-12-14 LAB — CMP (CANCER CENTER ONLY)
ALT: 6 U/L (ref 0–44)
AST: 11 U/L — ABNORMAL LOW (ref 15–41)
Albumin: 3.6 g/dL (ref 3.5–5.0)
Alkaline Phosphatase: 82 U/L (ref 38–126)
Anion gap: 5 (ref 5–15)
BUN: 16 mg/dL (ref 8–23)
CO2: 26 mmol/L (ref 22–32)
Calcium: 8.7 mg/dL — ABNORMAL LOW (ref 8.9–10.3)
Chloride: 105 mmol/L (ref 98–111)
Creatinine: 0.79 mg/dL (ref 0.61–1.24)
GFR, Estimated: >60 mL/min (ref >60)
Glucose, Bld: 99 mg/dL (ref 70–99)
Potassium: 4.1 mmol/L (ref 3.5–5.1)
Sodium: 136 mmol/L (ref 135–145)
Total Bilirubin: 0.3 mg/dL (ref 0.3–1.2)
Total Protein: 7.3 g/dL (ref 6.5–8.1)

## 2022-12-14 LAB — SAMPLE TO BLOOD BANK

## 2022-12-14 MED ORDER — SODIUM CHLORIDE 0.9 % IV SOLN: Freq: Once | INTRAVENOUS | Status: AC

## 2022-12-14 MED ORDER — SODIUM CHLORIDE 0.9 % IV SOLN
480.0000 mg | Freq: Once | INTRAVENOUS | Status: AC
  Administered 2022-12-14: 480 mg via INTRAVENOUS
  Filled 2022-12-14: qty 48

## 2022-12-14 NOTE — Progress Notes (Signed)
Hematology and Oncology Follow Up Visit  Anthony Sheppard [id number] 02-11-1936 86 y.o. 12/14/2022 10:33 AM System, Provider Not Zella Richer, MD   Principle Diagnosis: 86 year old man with kidney cancer diagnosed in September 2023.  He was found to have stage IV, intermediate risk clear-cell with abdominal adenopathy and pulmonary nodules.    Prior Therapy: He is status post kidney biopsy completed on August 30, 2022.  This confirmed the presence of renal cell carcinoma.  Ipilimumab 1 mg/kg with nivolumab 3 mg/kg started September 21, 2022.  He completed 4 cycles of therapy in December of 2023 with excellent response.  Current therapy: Nivolumab by maintenance at 480 mg every 4 weeks scheduled to start on December 14, 2022.    Interim History: Anthony Sheppard returns today for a follow-up.  Since the last visit, he reports no major changes in his health.  He continues to tolerate the immunotherapy without any issues.  He denies any nausea, vomiting or abdominal pain.  He denies excessive fatigue or tiredness.  He denies skin rash or diarrhea.  Denies shortness of breath or any recent hospitalizations.  His performance status quality of life remains unchanged.     Medications: Updated on review. Current Outpatient Medications  Medication Sig Dispense Refill   acetaminophen-codeine 120-12 MG/5ML solution Take 10 mLs by mouth every 4 (four) hours as needed for moderate pain. 120 mL 0   ADVIL 200 MG CAPS Take 400 mg by mouth every 6 (six) hours as needed (for mild pain or headaches).     ALEVE 220 MG tablet Take 220-440 mg by mouth 2 (two) times daily as needed (for mild pain or headaches).     cetirizine (ZYRTEC) 10 MG tablet Take 10 mg by mouth at bedtime.     ciprofloxacin (CIPRO) 500 MG tablet Take 1 tablet (500 mg total) by mouth 2 (two) times daily. 14 tablet 0   finasteride (PROSCAR) 5 MG tablet Take 5 mg by mouth at bedtime.     Guaifenesin 1200 MG TB12 Take 1 tablet (1,200 mg  total) by mouth 2 (two) times daily. 20 each 0   ibuprofen (ADVIL,MOTRIN) 800 MG tablet Take 1 tablet (800 mg total) by mouth every 8 (eight) hours as needed. 21 tablet 0   methocarbamol (ROBAXIN) 500 MG tablet Take 1 tablet (500 mg total) by mouth 2 (two) times daily. 20 tablet 0   prochlorperazine (COMPAZINE) 10 MG tablet Take 1 tablet (10 mg total) by mouth every 6 (six) hours as needed for nausea or vomiting. 30 tablet 1   simethicone (MYLICON) 80 MG chewable tablet Chew 160 mg by mouth 3 (three) times daily as needed for flatulence. (Patient not taking: Reported on 09/07/2022)     tamsulosin (FLOMAX) 0.4 MG CAPS capsule Take 0.4 mg by mouth at bedtime.      No current facility-administered medications for this visit.     Allergies:  Allergies  Allergen Reactions   Aspirin Nausea And Vomiting      Physical Exam:  Blood pressure 125/64, pulse 67, temperature 98.2 F (36.8 C), temperature source Temporal, resp. rate 15, weight 124 lb 14.4 oz (56.7 kg), SpO2 96 %.   ECOG: 1    General appearance: Alert, awake without any distress. Head: Atraumatic without abnormalities Oropharynx: Without any thrush or ulcers. Eyes: No scleral icterus. Lymph nodes: No lymphadenopathy noted in the cervical, supraclavicular, or axillary nodes Heart:regular rate and rhythm, without any murmurs or gallops.   Lung: Clear to auscultation without any rhonchi,  wheezes or dullness to percussion. Abdomin: Soft, nontender without any shifting dullness or ascites. Musculoskeletal: No clubbing or cyanosis. Neurological: No motor or sensory deficits. Skin: No rashes or lesions.     Lab Results: Lab Results  Component Value Date   WBC 6.8 11/23/2022   HGB 9.7 (L) 11/23/2022   HCT 31.0 (L) 11/23/2022   MCV 83.3 11/23/2022   PLT 263 11/23/2022     Chemistry      Component Value Date/Time   NA 137 11/23/2022 1136   K 4.5 11/23/2022 1136   CL 106 11/23/2022 1136   CO2 26 11/23/2022 1136   BUN  21 11/23/2022 1136   CREATININE 0.68 11/23/2022 1136      Component Value Date/Time   CALCIUM 8.9 11/23/2022 1136   ALKPHOS 94 11/23/2022 1136   AST 11 (L) 11/23/2022 1136   ALT 7 11/23/2022 1136   BILITOT 0.3 11/23/2022 1136       IMPRESSION: 1. Interval response to therapy as evidenced by decrease in hypermetabolism associated with the left renal mass, resolved pulmonary nodules and a single residual mildly hypermetabolic osseous lesion (K80). 2. Similar small hypermetabolic AP window lymph nodes. New hypermetabolism in the left hilum. Recommend attention on follow-up. 3. Heterogeneous and nodular thyroid, enlarged on the left. Recommend thyroid ultrasound. (Ref: J Am Coll Radiol. 2015 Feb;12(2): 143-50). 4. Coronary artery calcification. 5. Enlarged prostate.    Impression and Plan:  86 year old with:   1.  Stage IV clear-cell renal cell carcinoma with metastatic disease including abdominal adenopathy and pulmonary nodules and bone involvement.   He has completed 4 cycles of ipilimumab and nivolumab with excellent response to therapy.  PET scan obtained on 12/02/2022 showed interval improvement and increase in the left renal mass as well as pulmonary nodules and very little residual disease.  Based on these findings, I have recommended continuing nivolumab maintenance for at least 2 years as long as he is responding and tolerating it.  Different salvage therapy options including axitinib, cabozantinib among others will be considered if he has progression of disease.  I recommend updating his staging scan in the next 3 to 6 months.   2.  CNS staging: Imaging studies of the brain in December 2023 did not show any evidence of metastatic disease.   3.  T12 compression fracture: Continues to be improving radiographically and clinically at this time.   4.  Prognosis and goals of care: His disease is incurable although aggressive measures are warranted given his excellent  performance status and response to therapy.   5.  Autoimmune complications: These complications including pneumonitis, colitis and thyroid disease among others.  Will continue to reiterate and address these issues.  6.  Anemia: Related to malignancy and chronic disease.  His hemoglobin continues to improve without any need for additional transfusion.   7.  Follow-up: In 4 weeks for repeat follow-up in the next cycle of therapy.   30  minutes were spent on this visit.  The time was dedicated to updating disease status, treatment choices and outlining future plan of care review.   Zola Button, MD 12/26/202310:33 AM

## 2022-12-22 ENCOUNTER — Other Ambulatory Visit: Payer: Self-pay

## 2023-01-11 ENCOUNTER — Other Ambulatory Visit: Payer: Self-pay

## 2023-01-11 ENCOUNTER — Inpatient Hospital Stay: Payer: Medicare PPO | Attending: Oncology

## 2023-01-11 ENCOUNTER — Inpatient Hospital Stay: Payer: Medicare PPO

## 2023-01-11 ENCOUNTER — Inpatient Hospital Stay (HOSPITAL_BASED_OUTPATIENT_CLINIC_OR_DEPARTMENT_OTHER): Payer: Medicare PPO | Admitting: Internal Medicine

## 2023-01-11 VITALS — BP 112/73 | HR 66 | Temp 98.2°F | Resp 16

## 2023-01-11 VITALS — BP 130/64 | HR 65 | Temp 97.9°F | Resp 13 | Wt 114.7 lb

## 2023-01-11 DIAGNOSIS — Z5111 Encounter for antineoplastic chemotherapy: Secondary | ICD-10-CM | POA: Diagnosis not present

## 2023-01-11 DIAGNOSIS — C642 Malignant neoplasm of left kidney, except renal pelvis: Secondary | ICD-10-CM

## 2023-01-11 DIAGNOSIS — Z79899 Other long term (current) drug therapy: Secondary | ICD-10-CM | POA: Diagnosis not present

## 2023-01-11 DIAGNOSIS — C649 Malignant neoplasm of unspecified kidney, except renal pelvis: Secondary | ICD-10-CM | POA: Diagnosis not present

## 2023-01-11 DIAGNOSIS — D649 Anemia, unspecified: Secondary | ICD-10-CM

## 2023-01-11 LAB — CBC WITH DIFFERENTIAL (CANCER CENTER ONLY)
Abs Immature Granulocytes: 0.01 10*3/uL (ref 0.00–0.07)
Basophils Absolute: 0 10*3/uL (ref 0.0–0.1)
Basophils Relative: 0 %
Eosinophils Absolute: 0.5 10*3/uL (ref 0.0–0.5)
Eosinophils Relative: 8 %
HCT: 31.1 % — ABNORMAL LOW (ref 39.0–52.0)
Hemoglobin: 9.9 g/dL — ABNORMAL LOW (ref 13.0–17.0)
Immature Granulocytes: 0 %
Lymphocytes Relative: 16 %
Lymphs Abs: 0.9 10*3/uL (ref 0.7–4.0)
MCH: 26.9 pg (ref 26.0–34.0)
MCHC: 31.8 g/dL (ref 30.0–36.0)
MCV: 84.5 fL (ref 80.0–100.0)
Monocytes Absolute: 1 10*3/uL (ref 0.1–1.0)
Monocytes Relative: 17 %
Neutro Abs: 3.5 10*3/uL (ref 1.7–7.7)
Neutrophils Relative %: 59 %
Platelet Count: 219 10*3/uL (ref 150–400)
RBC: 3.68 MIL/uL — ABNORMAL LOW (ref 4.22–5.81)
RDW: 15.8 % — ABNORMAL HIGH (ref 11.5–15.5)
WBC Count: 5.9 10*3/uL (ref 4.0–10.5)
nRBC: 0 % (ref 0.0–0.2)

## 2023-01-11 LAB — CMP (CANCER CENTER ONLY)
ALT: 7 U/L (ref 0–44)
AST: 13 U/L — ABNORMAL LOW (ref 15–41)
Albumin: 3.5 g/dL (ref 3.5–5.0)
Alkaline Phosphatase: 81 U/L (ref 38–126)
Anion gap: 6 (ref 5–15)
BUN: 23 mg/dL (ref 8–23)
CO2: 25 mmol/L (ref 22–32)
Calcium: 8.7 mg/dL — ABNORMAL LOW (ref 8.9–10.3)
Chloride: 105 mmol/L (ref 98–111)
Creatinine: 0.78 mg/dL (ref 0.61–1.24)
GFR, Estimated: >60 mL/min (ref >60)
Glucose, Bld: 85 mg/dL (ref 70–99)
Potassium: 3.9 mmol/L (ref 3.5–5.1)
Sodium: 136 mmol/L (ref 135–145)
Total Bilirubin: 0.5 mg/dL (ref 0.3–1.2)
Total Protein: 7 g/dL (ref 6.5–8.1)

## 2023-01-11 LAB — TSH: TSH: 0.972 u[IU]/mL (ref 0.350–4.500)

## 2023-01-11 LAB — SAMPLE TO BLOOD BANK

## 2023-01-11 MED ORDER — SODIUM CHLORIDE 0.9 % IV SOLN: Freq: Once | INTRAVENOUS | Status: AC

## 2023-01-11 MED ORDER — SODIUM CHLORIDE 0.9 % IV SOLN
480.0000 mg | Freq: Once | INTRAVENOUS | Status: AC
  Administered 2023-01-11: 480 mg via INTRAVENOUS
  Filled 2023-01-11: qty 48

## 2023-01-11 NOTE — Patient Instructions (Signed)
[facility]  Discharge Instructions: Thank you for choosing [facility] to provide your oncology and hematology care.   If you have a lab appointment with the Cancer Center, please go directly to the Cancer Center and check in at the registration area.   Wear comfortable clothing and clothing appropriate for easy access to any Portacath or PICC line.   We strive to give you quality time with your provider. You may need to reschedule your appointment if you arrive late (15 or more minutes).  Arriving late affects you and other patients whose appointments are after yours.  Also, if you miss three or more appointments without notifying the office, you may be dismissed from the clinic at the provider's discretion.      For prescription refill requests, have your pharmacy contact our office and allow 72 hours for refills to be completed.    Today you received the following chemotherapy and/or immunotherapy agent: Nivolumab (Opdivo)   To help prevent nausea and vomiting after your treatment, we encourage you to take your nausea medication as directed.  BELOW ARE SYMPTOMS THAT SHOULD BE REPORTED IMMEDIATELY: *FEVER GREATER THAN 100.4 F (38 C) OR HIGHER *CHILLS OR SWEATING *NAUSEA AND VOMITING THAT IS NOT CONTROLLED WITH YOUR NAUSEA MEDICATION *UNUSUAL SHORTNESS OF BREATH *UNUSUAL BRUISING OR BLEEDING *URINARY PROBLEMS (pain or burning when urinating, or frequent urination) *BOWEL PROBLEMS (unusual diarrhea, constipation, pain near the anus) TENDERNESS IN MOUTH AND THROAT WITH OR WITHOUT PRESENCE OF ULCERS (sore throat, sores in mouth, or a toothache) UNUSUAL RASH, SWELLING OR PAIN  UNUSUAL VAGINAL DISCHARGE OR ITCHING   Items with * indicate a potential emergency and should be followed up as soon as possible or go to the Emergency Department if any problems should occur.  Please show the CHEMOTHERAPY ALERT CARD or IMMUNOTHERAPY ALERT CARD at  check-in to the Emergency Department and triage nurse.  Should you have questions after your visit or need to cancel or reschedule your appointment, please contact [facility]  Dept: [phone]  and follow the prompts.  Office hours are 8:00 a.m. to 4:30 p.m. Monday - Friday. Please note that voicemails left after 4:00 p.m. may not be returned until the following business day.  We are closed weekends and major holidays. You have access to a nurse at all times for urgent questions. Please call the main number to the clinic Dept: [phone] and follow the prompts.   For any non-urgent questions, you may also contact your provider using MyChart. We now offer e-Visits for anyone 18 and older to request care online for non-urgent symptoms. For details visit [email or web address].   Also download the MyChart app! Go to the app store, search "MyChart", open the app, select , and log in with your MyChart username and password.  Nivolumab Injection What is this medication? NIVOLUMAB (nye VOL ue mab) treats some types of cancer. It works by helping your immune system slow or stop the spread of cancer cells. It is a monoclonal antibody. This medicine may be used for other purposes; ask your health care provider or pharmacist if you have questions. COMMON BRAND NAME(S): Opdivo What should I tell my care team before I take this medication? They need to know if you have any of these conditions: Allogeneic stem cell transplant (uses someone else's stem cells) Autoimmune diseases, such as Crohn disease, ulcerative colitis, lupus History of chest radiation Nervous system problems, such   as Guillain-Barre syndrome or myasthenia gravis Organ transplant An unusual or allergic reaction to nivolumab, other medications, foods, dyes, or preservatives Pregnant or trying to get pregnant Breast-feeding How should I use this medication? This medication is infused  into a vein. It is given in a hospital or clinic setting. A special MedGuide will be given to you before each treatment. Be sure to read this information carefully each time. Talk to your care team about the use of this medication in children. While it may be prescribed for children as young as 12 years for selected conditions, precautions do apply. Overdosage: If you think you have taken too much of this medicine contact a poison control center or emergency room at once. NOTE: This medicine is only for you. Do not share this medicine with others. What if I miss a dose? Keep appointments for follow-up doses. It is important not to miss your dose. Call your care team if you are unable to keep an appointment. What may interact with this medication? Interactions have not been studied. This list may not describe all possible interactions. Give your health care provider a list of all the medicines, herbs, non-prescription drugs, or dietary supplements you use. Also tell them if you smoke, drink alcohol, or use illegal drugs. Some items may interact with your medicine. What should I watch for while using this medication? Your condition will be monitored carefully while you are receiving this medication. You may need blood work while taking this medication. This medication may cause serious skin reactions. They can happen weeks to months after starting the medication. Contact your care team right away if you notice fevers or flu-like symptoms with a rash. The rash may be red or purple and then turn into blisters or peeling of the skin. You may also notice a red rash with swelling of the face, lips, or lymph nodes in your neck or under your arms. Tell your care team right away if you have any change in your eyesight. Talk to your care team if you are pregnant or think you might be pregnant. A negative pregnancy test is required before starting this medication. A reliable form of contraception is recommended  while taking this medication and for 5 months after the last dose. Talk to your care team about effective forms of contraception. Do not breast-feed while taking this medication and for 5 months after the last dose. What side effects may I notice from receiving this medication? Side effects that you should report to your care team as soon as possible: Allergic reactions--skin rash, itching, hives, swelling of the face, lips, tongue, or throat Dry cough, shortness of breath or trouble breathing Eye pain, redness, irritation, or discharge with blurry or decreased vision Heart muscle inflammation--unusual weakness or fatigue, shortness of breath, chest pain, fast or irregular heartbeat, dizziness, swelling of the ankles, feet, or hands Hormone gland problems--headache, sensitivity to light, unusual weakness or fatigue, dizziness, fast or irregular heartbeat, increased sensitivity to cold or heat, excessive sweating, constipation, hair loss, increased thirst or amount of urine, tremors or shaking, irritability Infusion reactions--chest pain, shortness of breath or trouble breathing, feeling faint or lightheaded Kidney injury (glomerulonephritis)--decrease in the amount of urine, red or dark brown urine, foamy or bubbly urine, swelling of the ankles, hands, or feet Liver injury--right upper belly pain, loss of appetite, nausea, light-colored stool, dark yellow or brown urine, yellowing skin or eyes, unusual weakness or fatigue Pain, tingling, or numbness in the hands or feet, muscle weakness,   change in vision, confusion or trouble speaking, loss of balance or coordination, trouble walking, seizures Rash, fever, and swollen lymph nodes Redness, blistering, peeling, or loosening of the skin, including inside the mouth Sudden or severe stomach pain, bloody diarrhea, fever, nausea, vomiting Side effects that usually do not require medical attention (report these to your care team if they continue or are  bothersome): Bone, joint, or muscle pain Diarrhea Fatigue Loss of appetite Nausea Skin rash This list may not describe all possible side effects. Call your doctor for medical advice about side effects. You may report side effects to FDA at 1-800-FDA-1088. Where should I keep my medication? This medication is given in a hospital or clinic. It will not be stored at home. NOTE: This sheet is a summary. It may not cover all possible information. If you have questions about this medicine, talk to your doctor, pharmacist, or health care provider.  2023 Elsevier/Gold Standard (2022-04-05 00:00:00)    

## 2023-01-11 NOTE — Progress Notes (Signed)
[place] Telephone:([phone]) [phone]   Fax:(336) [phone]  OFFICE PROGRESS NOTE  System, Provider Not In No address on file  DIAGNOSIS: Stage IV intermediate risk clear-cell renal cell carcinoma diagnosed in September 2023 with abdominal adenopathy and pulmonary nodules.  PRIOR THERAPY:  1) Status post kidney biopsy on August 30, 2022 consistent with renal cell carcinoma. 2) Induction treatment with immunotherapy with ipilimumab 1 Mg/KG and nivolumab 3 mg/KG every 3 weeks started on September 21, 2022 status post 4 cycles with partial response.  CURRENT THERAPY: Maintenance treatment with nivolumab 480 Mg IV every 4 weeks first dose started December 14, 2022.  Status post 1 cycle of the maintenance therapy.  INTERVAL HISTORY: Anthony Sheppard 87 y.o. male returns to the clinic today for follow-up visit accompanied by his goddaughter Anthony Sheppard.  The patient is feeling fine today with no concerning complaints.  He lost few pounds in the last several weeks but he started eating more and gaining weight again.  He denied having any current chest pain, shortness of breath, cough or hemoptysis.  He has no nausea, vomiting, diarrhea or constipation.  He has no headache or visual changes.  He is here today to establish care with me after Dr. Alen Blew left the practice.  He has been tolerating his treatment with immunotherapy fairly well.  He is here for evaluation before starting cycle #6 of his treatment.  MEDICAL HISTORY: Past Medical History:  Diagnosis Date   Diabetes mellitus    Enlarged prostate    Hypertension    Sleep apnea     ALLERGIES:  is allergic to aspirin.  MEDICATIONS:  Current Outpatient Medications  Medication Sig Dispense Refill   acetaminophen-codeine 120-12 MG/5ML solution Take 10 mLs by mouth every 4 (four) hours as needed for moderate pain. 120 mL 0   ADVIL 200 MG CAPS Take 400 mg by mouth every 6 (six) hours as needed (for mild pain or headaches).      ALEVE 220 MG tablet Take 220-440 mg by mouth 2 (two) times daily as needed (for mild pain or headaches).     cetirizine (ZYRTEC) 10 MG tablet Take 10 mg by mouth at bedtime.     ciprofloxacin (CIPRO) 500 MG tablet Take 1 tablet (500 mg total) by mouth 2 (two) times daily. 14 tablet 0   finasteride (PROSCAR) 5 MG tablet Take 5 mg by mouth at bedtime.     Guaifenesin 1200 MG TB12 Take 1 tablet (1,200 mg total) by mouth 2 (two) times daily. 20 each 0   ibuprofen (ADVIL,MOTRIN) 800 MG tablet Take 1 tablet (800 mg total) by mouth every 8 (eight) hours as needed. 21 tablet 0   methocarbamol (ROBAXIN) 500 MG tablet Take 1 tablet (500 mg total) by mouth 2 (two) times daily. 20 tablet 0   prochlorperazine (COMPAZINE) 10 MG tablet Take 1 tablet (10 mg total) by mouth every 6 (six) hours as needed for nausea or vomiting. 30 tablet 1   simethicone (MYLICON) 80 MG chewable tablet Chew 160 mg by mouth 3 (three) times daily as needed for flatulence. (Patient not taking: Reported on 09/07/2022)     tamsulosin (FLOMAX) 0.4 MG CAPS capsule Take 0.4 mg by mouth at bedtime.      No current facility-administered medications for this visit.    SURGICAL HISTORY: No past surgical history on file.  REVIEW OF SYSTEMS:  Constitutional: positive for weight loss Eyes: negative Ears, nose, mouth, throat, and face: negative Respiratory: negative  Cardiovascular: negative Gastrointestinal: negative Genitourinary:negative Integument/breast: negative Hematologic/lymphatic: negative Musculoskeletal:negative Neurological: negative Behavioral/Psych: negative Endocrine: negative Allergic/Immunologic: negative   PHYSICAL EXAMINATION: General appearance: alert, cooperative, fatigued, and no distress Head: Normocephalic, without obvious abnormality, atraumatic Neck: no adenopathy, no JVD, supple, symmetrical, trachea midline, and thyroid not enlarged, symmetric, no tenderness/mass/nodules Lymph nodes: Cervical,  supraclavicular, and axillary nodes normal. Resp: clear to auscultation bilaterally Back: symmetric, no curvature. ROM normal. No CVA tenderness. Cardio: regular rate and rhythm, S1, S2 normal, no murmur, click, rub or gallop GI: soft, non-tender; bowel sounds normal; no masses,  no organomegaly Extremities: extremities normal, atraumatic, no cyanosis or edema Neurologic: Alert and oriented X 3, normal strength and tone. Normal symmetric reflexes. Normal coordination and gait  ECOG PERFORMANCE STATUS: 1 - Symptomatic but completely ambulatory  Blood pressure 130/64, pulse 65, temperature 97.9 F (36.6 C), temperature source Oral, resp. rate 13, weight 114 lb 11.2 oz (52 kg), SpO2 99 %.  LABORATORY DATA: Lab Results  Component Value Date   WBC 5.9 01/11/2023   HGB 9.9 (L) 01/11/2023   HCT 31.1 (L) 01/11/2023   MCV 84.5 01/11/2023   PLT 219 01/11/2023      Chemistry      Component Value Date/Time   NA 136 12/14/2022 1022   K 4.1 12/14/2022 1022   CL 105 12/14/2022 1022   CO2 26 12/14/2022 1022   BUN 16 12/14/2022 1022   CREATININE 0.79 12/14/2022 1022      Component Value Date/Time   CALCIUM 8.7 (L) 12/14/2022 1022   ALKPHOS 82 12/14/2022 1022   AST 11 (L) 12/14/2022 1022   ALT 6 12/14/2022 1022   BILITOT 0.3 12/14/2022 1022       RADIOGRAPHIC STUDIES: No results found.  ASSESSMENT AND PLAN: This is a very pleasant 87 years old African-American male with a stage IV intermediate risk clear-cell carcinoma diagnosed in September 2023 status post 4 cycles of induction treatment with immunotherapy with ipilimumab and nivolumab and he is currently on maintenance treatment with nivolumab 480 mg IV every 4 weeks status post total of 5 cycles.  The patient had good response after cycle #4. He is currently tolerating his treatment with the immunotherapy fairly well with no concerning adverse effect except for itching. I recommended for him to proceed with cycle #6 today as  planned. For the itching he will apply skin lotion and use Benadryl on as-needed basis. I will see him back for follow-up visit in 4 weeks for evaluation before starting cycle #7. The patient was advised to call immediately if he has any other concerning symptoms in the interval. The patient voices understanding of current disease status and treatment options and is in agreement with the current care plan.  All questions were answered. The patient knows to call the clinic with any problems, questions or concerns. We can certainly see the patient much sooner if necessary.  The total time spent in the appointment was 30 minutes.  Disclaimer: This note was dictated with voice recognition software. Similar sounding words can inadvertently be transcribed and may not be corrected upon review.

## 2023-01-12 ENCOUNTER — Other Ambulatory Visit: Payer: Self-pay

## 2023-01-13 LAB — T4: T4, Total: 4.8 ug/dL (ref 4.5–12.0)

## 2023-01-14 ENCOUNTER — Telehealth: Payer: Self-pay | Admitting: Internal Medicine

## 2023-01-14 NOTE — Telephone Encounter (Signed)
Called patient to confirm upcoming appointments. Voice mail was not set up. Mailing patient calendar.

## 2023-01-24 ENCOUNTER — Other Ambulatory Visit: Payer: Self-pay | Admitting: Oncology

## 2023-01-26 ENCOUNTER — Other Ambulatory Visit: Payer: Self-pay

## 2023-02-02 ENCOUNTER — Other Ambulatory Visit: Payer: Self-pay

## 2023-02-04 ENCOUNTER — Other Ambulatory Visit: Payer: Self-pay

## 2023-02-05 NOTE — Progress Notes (Unsigned)
[place] OFFICE PROGRESS NOTE  System, Provider Not In No address on file  DIAGNOSIS: Stage IV intermediate risk clear-cell renal cell carcinoma diagnosed in September 2023 with abdominal adenopathy and pulmonary nodules.   PRIOR THERAPY: 1) Status post kidney biopsy on August 30, 2022 consistent with renal cell carcinoma. 2) Induction treatment with immunotherapy with ipilimumab 1 Mg/KG and nivolumab 3 mg/KG every 3 weeks started on September 21, 2022 status post 4 cycles with partial response.  CURRENT THERAPY: Maintenance treatment with nivolumab 480 Mg IV every 4 weeks first dose started December 14, 2022.  Status post 2 cycles of the maintenance therapy.   INTERVAL HISTORY: Anthony Sheppard 87 y.o. male returns to the clinic today for follow-up visit accompanied by his goddaughter.  The patient is feeling well today without any concerning complaints.  He is currently undergoing immunotherapy with nivolumab IV every 4 weeks and is tolerating it well without any concerning adverse side effects.  He denies any recent fever, chills, night sweats, or unexplained weight loss.  Denies any nausea, vomiting, diarrhea, or constipation.  Appetite?  Denies any chest pain, shortness of breath, cough, or hemoptysis.  Denies any rashes or skin changes.  Denies any unusual abdominal pain or back pain.  Denies any hematuria, malodorous urine, or cloudy urine.  He is here today for evaluation and repeat blood work before undergoing cycle #3 of maintenance nivolumab  MEDICAL HISTORY: Past Medical History:  Diagnosis Date   Diabetes mellitus    Enlarged prostate    Hypertension    Sleep apnea     ALLERGIES:  is allergic to aspirin.  MEDICATIONS:  Current Outpatient Medications  Medication Sig Dispense Refill   acetaminophen-codeine 120-12 MG/5ML solution Take 10 mLs by mouth every 4 (four) hours as needed for moderate pain. (Patient not taking: Reported on 01/11/2023) 120 mL 0   ADVIL  200 MG CAPS Take 400 mg by mouth every 6 (six) hours as needed (for mild pain or headaches). (Patient not taking: Reported on 01/11/2023)     ALEVE 220 MG tablet Take 220-440 mg by mouth 2 (two) times daily as needed (for mild pain or headaches). (Patient not taking: Reported on 01/11/2023)     amLODipine (NORVASC) 10 MG tablet Take 10 mg by mouth daily. Take 1/2 tablet daily     cetirizine (ZYRTEC) 10 MG tablet Take 10 mg by mouth at bedtime.     ciprofloxacin (CIPRO) 500 MG tablet Take 1 tablet (500 mg total) by mouth 2 (two) times daily. 14 tablet 0   finasteride (PROSCAR) 5 MG tablet Take 5 mg by mouth at bedtime.     Guaifenesin 1200 MG TB12 Take 1 tablet (1,200 mg total) by mouth 2 (two) times daily. 20 each 0   ibuprofen (ADVIL,MOTRIN) 800 MG tablet Take 1 tablet (800 mg total) by mouth every 8 (eight) hours as needed. (Patient not taking: Reported on 01/11/2023) 21 tablet 0   methocarbamol (ROBAXIN) 500 MG tablet Take 1 tablet (500 mg total) by mouth 2 (two) times daily. (Patient not taking: Reported on 01/11/2023) 20 tablet 0   prochlorperazine (COMPAZINE) 10 MG tablet Take 1 tablet (10 mg total) by mouth every 6 (six) hours as needed for nausea or vomiting. (Patient not taking: Reported on 01/11/2023) 30 tablet 1   simethicone (MYLICON) 80 MG chewable tablet Chew 160 mg by mouth 3 (three) times daily as needed for flatulence. (Patient not taking: Reported on 09/07/2022)     tamsulosin (FLOMAX) 0.4  MG CAPS capsule Take 0.4 mg by mouth at bedtime.      No current facility-administered medications for this visit.    SURGICAL HISTORY: No past surgical history on file.  REVIEW OF SYSTEMS:   Review of Systems  Constitutional: Negative for appetite change, chills, fatigue, fever and unexpected weight change.  HENT:   Negative for mouth sores, nosebleeds, sore throat and trouble swallowing.   Eyes: Negative for eye problems and icterus.  Respiratory: Negative for cough, hemoptysis, shortness of  breath and wheezing.   Cardiovascular: Negative for chest pain and leg swelling.  Gastrointestinal: Negative for abdominal pain, constipation, diarrhea, nausea and vomiting.  Genitourinary: Negative for bladder incontinence, difficulty urinating, dysuria, frequency and hematuria.   Musculoskeletal: Negative for back pain, gait problem, neck pain and neck stiffness.  Skin: Negative for itching and rash.  Neurological: Negative for dizziness, extremity weakness, gait problem, headaches, light-headedness and seizures.  Hematological: Negative for adenopathy. Does not bruise/bleed easily.  Psychiatric/Behavioral: Negative for confusion, depression and sleep disturbance. The patient is not nervous/anxious.     PHYSICAL EXAMINATION:  There were no vitals taken for this visit.  ECOG PERFORMANCE STATUS: {CHL ONC ECOG [id number]  Physical Exam  Constitutional: Oriented to person, place, and time and well-developed, well-nourished, and in no distress. No distress.  HENT:  Head: Normocephalic and atraumatic.  Mouth/Throat: Oropharynx is clear and moist. No oropharyngeal exudate.  Eyes: Conjunctivae are normal. Right eye exhibits no discharge. Left eye exhibits no discharge. No scleral icterus.  Neck: Normal range of motion. Neck supple.  Cardiovascular: Normal rate, regular rhythm, normal heart sounds and intact distal pulses.   Pulmonary/Chest: Effort normal and breath sounds normal. No respiratory distress. No wheezes. No rales.  Abdominal: Soft. Bowel sounds are normal. Exhibits no distension and no mass. There is no tenderness.  Musculoskeletal: Normal range of motion. Exhibits no edema.  Lymphadenopathy:    No cervical adenopathy.  Neurological: Alert and oriented to person, place, and time. Exhibits normal muscle tone. Gait normal. Coordination normal.  Skin: Skin is warm and dry. No rash noted. Not diaphoretic. No erythema. No pallor.  Psychiatric: Mood, memory and judgment normal.   Vitals reviewed.  LABORATORY DATA: Lab Results  Component Value Date   WBC 5.9 01/11/2023   HGB 9.9 (L) 01/11/2023   HCT 31.1 (L) 01/11/2023   MCV 84.5 01/11/2023   PLT 219 01/11/2023      Chemistry      Component Value Date/Time   NA 136 01/11/2023 0829   K 3.9 01/11/2023 0829   CL 105 01/11/2023 0829   CO2 25 01/11/2023 0829   BUN 23 01/11/2023 0829   CREATININE 0.78 01/11/2023 0829      Component Value Date/Time   CALCIUM 8.7 (L) 01/11/2023 0829   ALKPHOS 81 01/11/2023 0829   AST 13 (L) 01/11/2023 0829   ALT 7 01/11/2023 0829   BILITOT 0.5 01/11/2023 0829       RADIOGRAPHIC STUDIES:  No results found.   ASSESSMENT/PLAN:  This is a very pleasant 87 year old African-American male with a stage IV intermediate risk clear-cell carcinoma diagnosed in September 2023 status post 4 cycles of induction treatment with immunotherapy with ipilimumab and nivolumab and he is currently on maintenance treatment with nivolumab 480 mg IV every 4 weeks status post total of 6 cycles.  The patient had good response after cycle #4. He is currently tolerating his treatment with the immunotherapy fairly well with no concerning adverse effect except for itching.  I recommended for him to proceed with cycle #7 today as planned.  For the itching he will apply skin lotion and use Benadryl on as-needed basis. I will see him back for follow-up visit in 4 weeks for evaluation before starting cycle #8.  ***when do we scan  The patient was advised to call immediately if he has any concerning symptoms in the interval. The patient voices understanding of current disease status and treatment options and is in agreement with the current care plan. All questions were answered. The patient knows to call the clinic with any problems, questions or concerns. We can certainly see the patient much sooner if necessary        No orders of the defined types were placed in this encounter.    I spent  {CHL ONC TIME VISIT - [id number] counseling the patient face to face. The total time spent in the appointment was {CHL ONC TIME VISIT - [id number].  Lysbeth Dicola L Tommie Bohlken, PA-C 02/05/23

## 2023-02-08 ENCOUNTER — Encounter: Payer: Self-pay | Admitting: Internal Medicine

## 2023-02-08 ENCOUNTER — Other Ambulatory Visit: Payer: Self-pay

## 2023-02-08 ENCOUNTER — Inpatient Hospital Stay: Payer: Medicare PPO | Attending: Oncology

## 2023-02-08 ENCOUNTER — Inpatient Hospital Stay (HOSPITAL_BASED_OUTPATIENT_CLINIC_OR_DEPARTMENT_OTHER): Payer: Medicare PPO | Admitting: Physician Assistant

## 2023-02-08 ENCOUNTER — Inpatient Hospital Stay: Payer: Medicare PPO

## 2023-02-08 VITALS — BP 129/79 | HR 95 | Temp 98.6°F | Resp 16

## 2023-02-08 VITALS — BP 150/73 | HR 64 | Temp 97.8°F | Resp 15 | Wt 120.5 lb

## 2023-02-08 DIAGNOSIS — Z79899 Other long term (current) drug therapy: Secondary | ICD-10-CM | POA: Diagnosis not present

## 2023-02-08 DIAGNOSIS — Z5112 Encounter for antineoplastic immunotherapy: Secondary | ICD-10-CM

## 2023-02-08 DIAGNOSIS — Z9221 Personal history of antineoplastic chemotherapy: Secondary | ICD-10-CM | POA: Diagnosis not present

## 2023-02-08 DIAGNOSIS — C649 Malignant neoplasm of unspecified kidney, except renal pelvis: Secondary | ICD-10-CM | POA: Diagnosis not present

## 2023-02-08 DIAGNOSIS — F0390 Unspecified dementia without behavioral disturbance: Secondary | ICD-10-CM | POA: Insufficient documentation

## 2023-02-08 DIAGNOSIS — C642 Malignant neoplasm of left kidney, except renal pelvis: Secondary | ICD-10-CM

## 2023-02-08 LAB — CBC WITH DIFFERENTIAL (CANCER CENTER ONLY)
Abs Immature Granulocytes: 0.01 10*3/uL (ref 0.00–0.07)
Basophils Absolute: 0 10*3/uL (ref 0.0–0.1)
Basophils Relative: 0 %
Eosinophils Absolute: 0.4 10*3/uL (ref 0.0–0.5)
Eosinophils Relative: 8 %
HCT: 30.4 % — ABNORMAL LOW (ref 39.0–52.0)
Hemoglobin: 9.8 g/dL — ABNORMAL LOW (ref 13.0–17.0)
Immature Granulocytes: 0 %
Lymphocytes Relative: 19 %
Lymphs Abs: 1 10*3/uL (ref 0.7–4.0)
MCH: 28.2 pg (ref 26.0–34.0)
MCHC: 32.2 g/dL (ref 30.0–36.0)
MCV: 87.4 fL (ref 80.0–100.0)
Monocytes Absolute: 0.9 10*3/uL (ref 0.1–1.0)
Monocytes Relative: 15 %
Neutro Abs: 3.2 10*3/uL (ref 1.7–7.7)
Neutrophils Relative %: 58 %
Platelet Count: 216 10*3/uL (ref 150–400)
RBC: 3.48 MIL/uL — ABNORMAL LOW (ref 4.22–5.81)
RDW: 14 % (ref 11.5–15.5)
WBC Count: 5.6 10*3/uL (ref 4.0–10.5)
nRBC: 0 % (ref 0.0–0.2)

## 2023-02-08 LAB — CMP (CANCER CENTER ONLY)
ALT: 7 U/L (ref 0–44)
AST: 11 U/L — ABNORMAL LOW (ref 15–41)
Albumin: 3.8 g/dL (ref 3.5–5.0)
Alkaline Phosphatase: 85 U/L (ref 38–126)
Anion gap: 5 (ref 5–15)
BUN: 27 mg/dL — ABNORMAL HIGH (ref 8–23)
CO2: 26 mmol/L (ref 22–32)
Calcium: 8.3 mg/dL — ABNORMAL LOW (ref 8.9–10.3)
Chloride: 108 mmol/L (ref 98–111)
Creatinine: 0.81 mg/dL (ref 0.61–1.24)
GFR, Estimated: >60 mL/min (ref >60)
Glucose, Bld: 94 mg/dL (ref 70–99)
Potassium: 4.3 mmol/L (ref 3.5–5.1)
Sodium: 139 mmol/L (ref 135–145)
Total Bilirubin: 0.3 mg/dL (ref 0.3–1.2)
Total Protein: 7.1 g/dL (ref 6.5–8.1)

## 2023-02-08 MED ORDER — SODIUM CHLORIDE 0.9 % IV SOLN
480.0000 mg | Freq: Once | INTRAVENOUS | Status: AC
  Administered 2023-02-08: 480 mg via INTRAVENOUS
  Filled 2023-02-08: qty 48

## 2023-02-08 MED ORDER — SODIUM CHLORIDE 0.9% FLUSH: 10.0000 mL | INTRAVENOUS | Status: DC | PRN

## 2023-02-08 MED ORDER — HEPARIN SOD (PORK) LOCK FLUSH 100 UNIT/ML IV SOLN: 500.0000 [IU] | Freq: Once | INTRAVENOUS | Status: DC | PRN

## 2023-02-08 MED ORDER — SODIUM CHLORIDE 0.9 % IV SOLN: Freq: Once | INTRAVENOUS | Status: AC

## 2023-02-08 NOTE — Patient Instructions (Signed)
[facility]  Discharge Instructions: Thank you for choosing [facility] to provide your oncology and hematology care.   If you have a lab appointment with the Cancer Center, please go directly to the Cancer Center and check in at the registration area.   Wear comfortable clothing and clothing appropriate for easy access to any Portacath or PICC line.   We strive to give you quality time with your provider. You may need to reschedule your appointment if you arrive late (15 or more minutes).  Arriving late affects you and other patients whose appointments are after yours.  Also, if you miss three or more appointments without notifying the office, you may be dismissed from the clinic at the provider's discretion.      For prescription refill requests, have your pharmacy contact our office and allow 72 hours for refills to be completed.    Today you received the following chemotherapy and/or immunotherapy agents: Opdivo      To help prevent nausea and vomiting after your treatment, we encourage you to take your nausea medication as directed.  BELOW ARE SYMPTOMS THAT SHOULD BE REPORTED IMMEDIATELY: *FEVER GREATER THAN 100.4 F (38 C) OR HIGHER *CHILLS OR SWEATING *NAUSEA AND VOMITING THAT IS NOT CONTROLLED WITH YOUR NAUSEA MEDICATION *UNUSUAL SHORTNESS OF BREATH *UNUSUAL BRUISING OR BLEEDING *URINARY PROBLEMS (pain or burning when urinating, or frequent urination) *BOWEL PROBLEMS (unusual diarrhea, constipation, pain near the anus) TENDERNESS IN MOUTH AND THROAT WITH OR WITHOUT PRESENCE OF ULCERS (sore throat, sores in mouth, or a toothache) UNUSUAL RASH, SWELLING OR PAIN  UNUSUAL VAGINAL DISCHARGE OR ITCHING   Items with * indicate a potential emergency and should be followed up as soon as possible or go to the Emergency Department if any problems should occur.  Please show the CHEMOTHERAPY ALERT CARD or IMMUNOTHERAPY ALERT CARD at check-in  to the Emergency Department and triage nurse.  Should you have questions after your visit or need to cancel or reschedule your appointment, please contact [facility]  Dept: [phone]  and follow the prompts.  Office hours are 8:00 a.m. to 4:30 p.m. Monday - Friday. Please note that voicemails left after 4:00 p.m. may not be returned until the following business day.  We are closed weekends and major holidays. You have access to a nurse at all times for urgent questions. Please call the main number to the clinic Dept: [phone] and follow the prompts.   For any non-urgent questions, you may also contact your provider using MyChart. We now offer e-Visits for anyone 18 and older to request care online for non-urgent symptoms. For details visit [email or web address].   Also download the MyChart app! Go to the app store, search "MyChart", open the app, select [facility], and log in with your MyChart username and password.   

## 2023-02-11 ENCOUNTER — Other Ambulatory Visit: Payer: Self-pay

## 2023-03-08 ENCOUNTER — Inpatient Hospital Stay (HOSPITAL_BASED_OUTPATIENT_CLINIC_OR_DEPARTMENT_OTHER): Payer: Medicare PPO | Admitting: Internal Medicine

## 2023-03-08 ENCOUNTER — Encounter: Payer: Self-pay | Admitting: Internal Medicine

## 2023-03-08 ENCOUNTER — Other Ambulatory Visit: Payer: Self-pay

## 2023-03-08 ENCOUNTER — Inpatient Hospital Stay: Payer: Medicare PPO | Attending: Oncology

## 2023-03-08 ENCOUNTER — Inpatient Hospital Stay: Payer: Medicare PPO

## 2023-03-08 VITALS — BP 146/67 | HR 63 | Temp 98.2°F | Resp 18 | Wt 120.2 lb

## 2023-03-08 VITALS — BP 113/59 | HR 72 | Resp 16

## 2023-03-08 DIAGNOSIS — C642 Malignant neoplasm of left kidney, except renal pelvis: Secondary | ICD-10-CM

## 2023-03-08 DIAGNOSIS — R918 Other nonspecific abnormal finding of lung field: Secondary | ICD-10-CM | POA: Diagnosis not present

## 2023-03-08 DIAGNOSIS — G4730 Sleep apnea, unspecified: Secondary | ICD-10-CM | POA: Insufficient documentation

## 2023-03-08 DIAGNOSIS — I10 Essential (primary) hypertension: Secondary | ICD-10-CM | POA: Insufficient documentation

## 2023-03-08 DIAGNOSIS — Z79899 Other long term (current) drug therapy: Secondary | ICD-10-CM | POA: Diagnosis not present

## 2023-03-08 DIAGNOSIS — N400 Enlarged prostate without lower urinary tract symptoms: Secondary | ICD-10-CM | POA: Insufficient documentation

## 2023-03-08 DIAGNOSIS — E119 Type 2 diabetes mellitus without complications: Secondary | ICD-10-CM | POA: Diagnosis not present

## 2023-03-08 DIAGNOSIS — R590 Localized enlarged lymph nodes: Secondary | ICD-10-CM | POA: Insufficient documentation

## 2023-03-08 DIAGNOSIS — Z5112 Encounter for antineoplastic immunotherapy: Secondary | ICD-10-CM | POA: Diagnosis not present

## 2023-03-08 DIAGNOSIS — C649 Malignant neoplasm of unspecified kidney, except renal pelvis: Secondary | ICD-10-CM | POA: Diagnosis not present

## 2023-03-08 DIAGNOSIS — D649 Anemia, unspecified: Secondary | ICD-10-CM

## 2023-03-08 LAB — CBC WITH DIFFERENTIAL (CANCER CENTER ONLY)
Abs Immature Granulocytes: 0.01 10*3/uL (ref 0.00–0.07)
Basophils Absolute: 0 10*3/uL (ref 0.0–0.1)
Basophils Relative: 0 %
Eosinophils Absolute: 0.4 10*3/uL (ref 0.0–0.5)
Eosinophils Relative: 7 %
HCT: 32.9 % — ABNORMAL LOW (ref 39.0–52.0)
Hemoglobin: 10.5 g/dL — ABNORMAL LOW (ref 13.0–17.0)
Immature Granulocytes: 0 %
Lymphocytes Relative: 17 %
Lymphs Abs: 0.8 10*3/uL (ref 0.7–4.0)
MCH: 28.3 pg (ref 26.0–34.0)
MCHC: 31.9 g/dL (ref 30.0–36.0)
MCV: 88.7 fL (ref 80.0–100.0)
Monocytes Absolute: 0.6 10*3/uL (ref 0.1–1.0)
Monocytes Relative: 13 %
Neutro Abs: 3 10*3/uL (ref 1.7–7.7)
Neutrophils Relative %: 63 %
Platelet Count: 212 10*3/uL (ref 150–400)
RBC: 3.71 MIL/uL — ABNORMAL LOW (ref 4.22–5.81)
RDW: 13 % (ref 11.5–15.5)
WBC Count: 4.8 10*3/uL (ref 4.0–10.5)
nRBC: 0 % (ref 0.0–0.2)

## 2023-03-08 LAB — CMP (CANCER CENTER ONLY)
ALT: 7 U/L (ref 0–44)
AST: 14 U/L — ABNORMAL LOW (ref 15–41)
Albumin: 3.9 g/dL (ref 3.5–5.0)
Alkaline Phosphatase: 86 U/L (ref 38–126)
Anion gap: 6 (ref 5–15)
BUN: 23 mg/dL (ref 8–23)
CO2: 24 mmol/L (ref 22–32)
Calcium: 8.8 mg/dL — ABNORMAL LOW (ref 8.9–10.3)
Chloride: 108 mmol/L (ref 98–111)
Creatinine: 0.85 mg/dL (ref 0.61–1.24)
GFR, Estimated: >60 mL/min (ref >60)
Glucose, Bld: 104 mg/dL — ABNORMAL HIGH (ref 70–99)
Potassium: 4.3 mmol/L (ref 3.5–5.1)
Sodium: 138 mmol/L (ref 135–145)
Total Bilirubin: 0.4 mg/dL (ref 0.3–1.2)
Total Protein: 7.4 g/dL (ref 6.5–8.1)

## 2023-03-08 LAB — SAMPLE TO BLOOD BANK

## 2023-03-08 MED ORDER — SODIUM CHLORIDE 0.9 % IV SOLN: Freq: Once | INTRAVENOUS | Status: AC

## 2023-03-08 MED ORDER — SODIUM CHLORIDE 0.9 % IV SOLN
480.0000 mg | Freq: Once | INTRAVENOUS | Status: AC
  Administered 2023-03-08: 480 mg via INTRAVENOUS
  Filled 2023-03-08: qty 48

## 2023-03-08 NOTE — Progress Notes (Signed)
[facility] [phone]   [phone]  OFFICE PROGRESS NOTE  System, Provider Not In No address on file  DIAGNOSIS: Stage IV intermediate risk clear-cell renal cell carcinoma diagnosed in September 2023 with abdominal adenopathy and pulmonary nodules.  PRIOR THERAPY:  1) Status post kidney biopsy on August 30, 2022 consistent with renal cell carcinoma. 2) Induction treatment with immunotherapy with ipilimumab 1 Mg/KG and nivolumab 3 mg/KG every 3 weeks started on September 21, 2022 status post 4 cycles with partial response.  CURRENT THERAPY: Maintenance treatment with nivolumab 480 Mg IV every 4 weeks first dose started December 14, 2022.  Status post 3 cycle of the maintenance therapy.  INTERVAL HISTORY: Anthony Sheppard 87 y.o. male returns to the clinic today for follow-up visit accompanied by his wife.  The patient is feeling fine today with no concerning complaints except for itching.  He has Benadryl at home but he does not take it at regular basis.  He denied having any chest pain, shortness of breath, cough or hemoptysis.  He has no nausea, vomiting, diarrhea or constipation.  He has no headache or visual changes.  He has no recent weight loss or night sweats.  He is here today for evaluation before starting cycle #4 of his maintenance treatment with nivolumab.  MEDICAL HISTORY: Past Medical History:  Diagnosis Date   Diabetes mellitus    Enlarged prostate    Hypertension    Sleep apnea     ALLERGIES:  is allergic to aspirin.  MEDICATIONS:  Current Outpatient Medications  Medication Sig Dispense Refill   ADVIL 200 MG CAPS Take 400 mg by mouth every 6 (six) hours as needed (for mild pain or headaches).     ALEVE 220 MG tablet Take 220-440 mg by mouth 2 (two) times daily as needed (for mild pain or headaches).     amLODipine (NORVASC) 5 MG tablet Take 5 mg by mouth daily.     cetirizine (ZYRTEC) 10 MG tablet Take 10 mg by mouth at  bedtime.     finasteride (PROSCAR) 5 MG tablet Take 5 mg by mouth at bedtime.     tamsulosin (FLOMAX) 0.4 MG CAPS capsule Take 0.4 mg by mouth at bedtime.      No current facility-administered medications for this visit.    SURGICAL HISTORY: History reviewed. No pertinent surgical history.  REVIEW OF SYSTEMS:  A comprehensive review of systems was negative except for: Constitutional: positive for fatigue Integument/breast: positive for pruritus   PHYSICAL EXAMINATION: General appearance: alert, cooperative, fatigued, and no distress Head: Normocephalic, without obvious abnormality, atraumatic Neck: no adenopathy, no JVD, supple, symmetrical, trachea midline, and thyroid not enlarged, symmetric, no tenderness/mass/nodules Lymph nodes: Cervical, supraclavicular, and axillary nodes normal. Resp: clear to auscultation bilaterally Back: symmetric, no curvature. ROM normal. No CVA tenderness. Cardio: regular rate and rhythm, S1, S2 normal, no murmur, click, rub or gallop GI: soft, non-tender; bowel sounds normal; no masses,  no organomegaly Extremities: extremities normal, atraumatic, no cyanosis or edema  ECOG PERFORMANCE STATUS: 1 - Symptomatic but completely ambulatory  Blood pressure (!) 146/67, pulse 63, temperature 98.2 F (36.8 C), temperature source Oral, resp. rate 18, weight 120 lb 3 oz (54.5 kg), SpO2 99 %.  LABORATORY DATA: Lab Results  Component Value Date   WBC 4.8 03/08/2023   HGB 10.5 (L) 03/08/2023   HCT 32.9 (L) 03/08/2023   MCV 88.7 03/08/2023   PLT 212 03/08/2023      Chemistry  Component Value Date/Time   NA 138 03/08/2023 0907   K 4.3 03/08/2023 0907   CL 108 03/08/2023 0907   CO2 24 03/08/2023 0907   BUN 23 03/08/2023 0907   CREATININE 0.85 03/08/2023 0907      Component Value Date/Time   CALCIUM 8.8 (L) 03/08/2023 0907   ALKPHOS 86 03/08/2023 0907   AST 14 (L) 03/08/2023 0907   ALT 7 03/08/2023 0907   BILITOT 0.4 03/08/2023 0907        RADIOGRAPHIC STUDIES: No results found.  ASSESSMENT AND PLAN: This is a very pleasant 87 years old African-American male with a stage IV intermediate risk clear-cell carcinoma diagnosed in September 2023 status post 4 cycles of induction treatment with immunotherapy with ipilimumab and nivolumab and starting from cycle #5 he is currently on maintenance treatment with nivolumab 480 mg IV every 4 weeks status post total of 7 cycles.  The patient has been tolerating this treatment well with no concerning adverse effects except for pruritus. I recommended for him to proceed with cycle #8 today as planned. I will see him back for follow-up visit in 4 weeks for evaluation before the next cycle of his treatment. For the itching, he will continue on Benadryl as needed. The patient was advised to call immediately if he has any concerning symptoms in the interval.  The patient voices understanding of current disease status and treatment options and is in agreement with the current care plan.  All questions were answered. The patient knows to call the clinic with any problems, questions or concerns. We can certainly see the patient much sooner if necessary.  The total time spent in the appointment was 20 minutes.  Disclaimer: This note was dictated with voice recognition software. Similar sounding words can inadvertently be transcribed and may not be corrected upon review.

## 2023-03-08 NOTE — Patient Instructions (Signed)
[facility]  Discharge Instructions: Thank you for choosing [facility] to provide your oncology and hematology care.   If you have a lab appointment with the Cancer Center, please go directly to the Cancer Center and check in at the registration area.   Wear comfortable clothing and clothing appropriate for easy access to any Portacath or PICC line.   We strive to give you quality time with your provider. You may need to reschedule your appointment if you arrive late (15 or more minutes).  Arriving late affects you and other patients whose appointments are after yours.  Also, if you miss three or more appointments without notifying the office, you may be dismissed from the clinic at the provider's discretion.      For prescription refill requests, have your pharmacy contact our office and allow 72 hours for refills to be completed.    Today you received the following chemotherapy and/or immunotherapy agents: Opdivo      To help prevent nausea and vomiting after your treatment, we encourage you to take your nausea medication as directed.  BELOW ARE SYMPTOMS THAT SHOULD BE REPORTED IMMEDIATELY: *FEVER GREATER THAN 100.4 F (38 C) OR HIGHER *CHILLS OR SWEATING *NAUSEA AND VOMITING THAT IS NOT CONTROLLED WITH YOUR NAUSEA MEDICATION *UNUSUAL SHORTNESS OF BREATH *UNUSUAL BRUISING OR BLEEDING *URINARY PROBLEMS (pain or burning when urinating, or frequent urination) *BOWEL PROBLEMS (unusual diarrhea, constipation, pain near the anus) TENDERNESS IN MOUTH AND THROAT WITH OR WITHOUT PRESENCE OF ULCERS (sore throat, sores in mouth, or a toothache) UNUSUAL RASH, SWELLING OR PAIN  UNUSUAL VAGINAL DISCHARGE OR ITCHING   Items with * indicate a potential emergency and should be followed up as soon as possible or go to the Emergency Department if any problems should occur.  Please show the CHEMOTHERAPY ALERT CARD or IMMUNOTHERAPY ALERT CARD at check-in  to the Emergency Department and triage nurse.  Should you have questions after your visit or need to cancel or reschedule your appointment, please contact [facility]  Dept: [phone]  and follow the prompts.  Office hours are 8:00 a.m. to 4:30 p.m. Monday - Friday. Please note that voicemails left after 4:00 p.m. may not be returned until the following business day.  We are closed weekends and major holidays. You have access to a nurse at all times for urgent questions. Please call the main number to the clinic Dept: [phone] and follow the prompts.   For any non-urgent questions, you may also contact your provider using MyChart. We now offer e-Visits for anyone 18 and older to request care online for non-urgent symptoms. For details visit [email or web address].   Also download the MyChart app! Go to the app store, search "MyChart", open the app, select , and log in with your MyChart username and password.   

## 2023-03-08 NOTE — Progress Notes (Signed)
Patient seen by MD today  Vitals are within treatment parameters.  Labs reviewed: and are within treatment parameters.  Per physician team, patient is ready for treatment and there are NO modifications to the treatment plan.  

## 2023-03-18 ENCOUNTER — Telehealth: Payer: Self-pay | Admitting: Internal Medicine

## 2023-03-18 NOTE — Telephone Encounter (Signed)
Called patient regarding upcoming April-May appointments, patient is notified. 

## 2023-03-22 ENCOUNTER — Other Ambulatory Visit: Payer: Self-pay

## 2023-03-24 ENCOUNTER — Other Ambulatory Visit: Payer: Self-pay

## 2023-03-24 ENCOUNTER — Telehealth: Payer: Self-pay | Admitting: Internal Medicine

## 2023-03-24 NOTE — Telephone Encounter (Signed)
Patient's relative called to try and reschedule tx dates. Unable to move treatment to date that was requested. Scheduled ahead for next treatment. Patient will be notified.

## 2023-03-25 ENCOUNTER — Other Ambulatory Visit: Payer: Self-pay

## 2023-04-03 NOTE — Progress Notes (Signed)
[facility] OFFICE PROGRESS NOTE  System, Provider Not In No address on file  DIAGNOSIS: Stage IV intermediate risk clear-cell renal cell carcinoma diagnosed in September 2023 with abdominal adenopathy and pulmonary nodules.   PRIOR THERAPY: 1) Status post kidney biopsy on August 30, 2022 consistent with renal cell carcinoma. 2) Induction treatment with immunotherapy with ipilimumab 1 Mg/KG and nivolumab 3 mg/KG every 3 weeks started on September 21, 2022 status post 4 cycles with partial response.  CURRENT THERAPY: Maintenance treatment with nivolumab 480 Mg IV every 4 weeks first dose started December 14, 2022.  Status post 4 cycles of the maintenance therapy.   INTERVAL HISTORY: Anthony Sheppard 87 y.o. male returns to the clinic today for a follow-up visit accompanied by his goddaughter.  The patient is feeling well today without any concerning complaints except he was working 5 days ago and a coworker noticed that the patient's right conjunctiva was red. He denies pain, vision changes, discharge, swelling, or any issues with EOM. This appears to be subconjunctival hemorrhage. He denies coughing or vomiting. He does sometime do lifting at work. His eye is still red today.     He is currently undergoing immunotherapy with nivolumab IV every 4 weeks and is tolerating it well without any concerning adverse side effects.  He denies any recent fever, chills, or night sweats. He lost 2 lbs since last being seen. He is wondering if he can see a member of the nutritionist team. Denies any nausea, vomiting, diarrhea, or constipation. Denies any chest pain, shortness of breath, cough, or hemoptysis.  Denies any rashes or skin changes.  He sometimes has itching. He takes zyrtec. Denies any unusual abdominal pain or back pain.  Denies any hematuria, malodorous urine, or cloudy urine.  He is here today for evaluation and repeat blood work before undergoing cycle #5 of maintenance nivolumab.     MEDICAL HISTORY: Past Medical History:  Diagnosis Date   Diabetes mellitus    Enlarged prostate    Hypertension    Sleep apnea     ALLERGIES:  is allergic to aspirin.  MEDICATIONS:  Current Outpatient Medications  Medication Sig Dispense Refill   ADVIL 200 MG CAPS Take 400 mg by mouth every 6 (six) hours as needed (for mild pain or headaches).     ALEVE 220 MG tablet Take 220-440 mg by mouth 2 (two) times daily as needed (for mild pain or headaches).     amLODipine (NORVASC) 5 MG tablet Take 5 mg by mouth daily.     cetirizine (ZYRTEC) 10 MG tablet Take 10 mg by mouth at bedtime.     finasteride (PROSCAR) 5 MG tablet Take 5 mg by mouth at bedtime.     tamsulosin (FLOMAX) 0.4 MG CAPS capsule Take 0.4 mg by mouth at bedtime.      No current facility-administered medications for this visit.    SURGICAL HISTORY: No past surgical history on file.  REVIEW OF SYSTEMS:   Review of Systems  Constitutional: Positive for 2 lb weight loss. Negative for appetite change, chills, fatigue, fever.  HENT: Negative for mouth sores, nosebleeds, sore throat and trouble swallowing.   Eyes: Positive for subconjunctival hemorrhage of the right eye. Negative for vision changes, pain, swelling, discharge, changes with EOM, or icterus.  Respiratory: Negative for cough, hemoptysis, shortness of breath and wheezing.   Cardiovascular: Negative for chest pain and leg swelling.  Gastrointestinal: Negative for abdominal pain, constipation, diarrhea, nausea and vomiting.  Genitourinary: Negative for  bladder incontinence, difficulty urinating, dysuria, frequency and hematuria.   Musculoskeletal: Negative for back pain, gait problem, neck pain and neck stiffness.  Skin: Positive for occasional itching without rash.  Neurological: Negative for dizziness, extremity weakness, gait problem, headaches, light-headedness and seizures.  Hematological: Negative for adenopathy. Does not bruise/bleed easily.   Psychiatric/Behavioral: Negative for confusion, depression and sleep disturbance. The patient is not nervous/anxious.     PHYSICAL EXAMINATION:  Blood pressure 134/72, pulse 64, temperature 97.8 F (36.6 C), temperature source Oral, resp. rate 16, height  (1.549 m), weight 118 lb 12.8 oz (53.9 kg), SpO2 95 %.  ECOG PERFORMANCE STATUS: 1  Physical Exam  Constitutional: Oriented to person, place, and time and elderly appearing male and in no distress.  HENT:  Head: Normocephalic and atraumatic.  Mouth/Throat: Oropharynx is clear and moist. No oropharyngeal exudate.  Eyes: Positive for right subconjunctival hemorrhage. Normal EOM.  Right eye exhibits no discharge. Left eye exhibits no discharge. No scleral icterus.  Neck: Normal range of motion. Neck supple.  Cardiovascular: Normal rate, regular rhythm, normal heart sounds and intact distal pulses.   Pulmonary/Chest: Effort normal and breath sounds normal. No respiratory distress. No wheezes. No rales.  Abdominal: Soft. Bowel sounds are normal. Exhibits no distension and no mass. There is no tenderness.  Musculoskeletal: Normal range of motion. Exhibits no edema.  Lymphadenopathy:    No cervical adenopathy.  Neurological: Alert and oriented to person, place, and time. Exhibits normal muscle tone. Gait normal. Coordination normal.  Skin: Skin is warm and dry. No rash noted. Not diaphoretic. No erythema. No pallor.  Psychiatric: Mood, memory and judgment normal.  Vitals reviewed.  LABORATORY DATA: Lab Results  Component Value Date   WBC 5.1 04/05/2023   HGB 10.0 (L) 04/05/2023   HCT 31.9 (L) 04/05/2023   MCV 90.4 04/05/2023   PLT 225 04/05/2023      Chemistry      Component Value Date/Time   NA 139 04/05/2023 1037   K 3.9 04/05/2023 1037   CL 108 04/05/2023 1037   CO2 24 04/05/2023 1037   BUN 25 (H) 04/05/2023 1037   CREATININE 0.79 04/05/2023 1037      Component Value Date/Time   CALCIUM 8.8 (L) 04/05/2023 1037    ALKPHOS 81 04/05/2023 1037   AST 13 (L) 04/05/2023 1037   ALT 9 04/05/2023 1037   BILITOT 0.5 04/05/2023 1037       RADIOGRAPHIC STUDIES:  No results found.   ASSESSMENT/PLAN:  This is a very pleasant 87 year old African-American male with a stage IV intermediate risk clear-cell carcinoma diagnosed in September 2023 status post 4 cycles of induction treatment with immunotherapy with ipilimumab and nivolumab and he is currently on maintenance treatment with nivolumab 480 mg IV every 4 weeks status post total of 8 cycles.  The patient had good response after cycle #4. He is currently tolerating his treatment with the immunotherapy fairly well with no concerning adverse effect except for itching. I recommended for him to proceed with cycle #9 today as planned.  For the itching he will apply skin lotion and use I let him know he can use zyrtec or claritin for itching on as-needed basis. I am concerned that benadryl would make him too drowsy with his age.   I will see him back for follow-up visit in 4 weeks for evaluation before starting cycle #10.  For his eye, this appears to be subconjunctival hemorrhage which may be due to lifting at work.  Discussed while this can look alarming, this is not dangerous. This should start dissipating over the next few days/weeks. I did let him know this sometimes may look worse before it gets better. I also reviewed symptoms that are not associated with a subconjunctival hemorrhage that would warrant immediate evaluation including eye pain, vision changes, difficulty with EOM, discharge, swelling, etc. He sees an eye doctor at [facility] and I advised him to call them if new or worsening symptoms. As of now, he has no associated symptoms.   I placed referral to nutritionist team per patient request  The patient was advised to call immediately if he has any concerning symptoms in the interval. The patient voices understanding of current disease status and  treatment options and is in agreement with the current care plan. All questions were answered. The patient knows to call the clinic with any problems, questions or concerns. We can certainly see the patient much sooner if necessary       Orders Placed This Encounter  Procedures   Ambulatory Referral to [facility]    Referral Priority:   Routine    Referral Type:   Consultation    Referral Reason:   Specialty Services Required    Requested Specialty:   Oncology    Number of Visits Requested:   1     The total time spent in the appointment was 20-29 minutes.   Harry Bark L Mescal Flinchbaugh, PA-C 04/05/23

## 2023-04-05 ENCOUNTER — Inpatient Hospital Stay: Payer: No Typology Code available for payment source

## 2023-04-05 ENCOUNTER — Inpatient Hospital Stay: Payer: No Typology Code available for payment source | Attending: Oncology

## 2023-04-05 ENCOUNTER — Inpatient Hospital Stay (HOSPITAL_BASED_OUTPATIENT_CLINIC_OR_DEPARTMENT_OTHER): Payer: Medicare PPO | Admitting: Physician Assistant

## 2023-04-05 ENCOUNTER — Other Ambulatory Visit: Payer: Self-pay

## 2023-04-05 VITALS — BP 134/72 | HR 64 | Temp 97.8°F | Resp 16 | Ht 61.0 in | Wt 118.8 lb

## 2023-04-05 DIAGNOSIS — Z5112 Encounter for antineoplastic immunotherapy: Secondary | ICD-10-CM

## 2023-04-05 DIAGNOSIS — Z79899 Other long term (current) drug therapy: Secondary | ICD-10-CM | POA: Diagnosis not present

## 2023-04-05 DIAGNOSIS — C642 Malignant neoplasm of left kidney, except renal pelvis: Secondary | ICD-10-CM

## 2023-04-05 DIAGNOSIS — C649 Malignant neoplasm of unspecified kidney, except renal pelvis: Secondary | ICD-10-CM | POA: Diagnosis not present

## 2023-04-05 DIAGNOSIS — Z9221 Personal history of antineoplastic chemotherapy: Secondary | ICD-10-CM | POA: Diagnosis not present

## 2023-04-05 LAB — CMP (CANCER CENTER ONLY)
ALT: 9 U/L (ref 0–44)
AST: 13 U/L — ABNORMAL LOW (ref 15–41)
Albumin: 3.9 g/dL (ref 3.5–5.0)
Alkaline Phosphatase: 81 U/L (ref 38–126)
Anion gap: 7 (ref 5–15)
BUN: 25 mg/dL — ABNORMAL HIGH (ref 8–23)
CO2: 24 mmol/L (ref 22–32)
Calcium: 8.8 mg/dL — ABNORMAL LOW (ref 8.9–10.3)
Chloride: 108 mmol/L (ref 98–111)
Creatinine: 0.79 mg/dL (ref 0.61–1.24)
GFR, Estimated: >60 mL/min (ref >60)
Glucose, Bld: 139 mg/dL — ABNORMAL HIGH (ref 70–99)
Potassium: 3.9 mmol/L (ref 3.5–5.1)
Sodium: 139 mmol/L (ref 135–145)
Total Bilirubin: 0.5 mg/dL (ref 0.3–1.2)
Total Protein: 7.5 g/dL (ref 6.5–8.1)

## 2023-04-05 LAB — CBC WITH DIFFERENTIAL (CANCER CENTER ONLY)
Abs Immature Granulocytes: 0.01 10*3/uL (ref 0.00–0.07)
Basophils Absolute: 0 10*3/uL (ref 0.0–0.1)
Basophils Relative: 0 %
Eosinophils Absolute: 0.2 10*3/uL (ref 0.0–0.5)
Eosinophils Relative: 5 %
HCT: 31.9 % — ABNORMAL LOW (ref 39.0–52.0)
Hemoglobin: 10 g/dL — ABNORMAL LOW (ref 13.0–17.0)
Immature Granulocytes: 0 %
Lymphocytes Relative: 18 %
Lymphs Abs: 0.9 10*3/uL (ref 0.7–4.0)
MCH: 28.3 pg (ref 26.0–34.0)
MCHC: 31.3 g/dL (ref 30.0–36.0)
MCV: 90.4 fL (ref 80.0–100.0)
Monocytes Absolute: 0.6 10*3/uL (ref 0.1–1.0)
Monocytes Relative: 12 %
Neutro Abs: 3.3 10*3/uL (ref 1.7–7.7)
Neutrophils Relative %: 65 %
Platelet Count: 225 10*3/uL (ref 150–400)
RBC: 3.53 MIL/uL — ABNORMAL LOW (ref 4.22–5.81)
RDW: 13.1 % (ref 11.5–15.5)
WBC Count: 5.1 10*3/uL (ref 4.0–10.5)
nRBC: 0 % (ref 0.0–0.2)

## 2023-04-05 LAB — TSH: TSH: 0.938 u[IU]/mL (ref 0.350–4.500)

## 2023-04-05 MED ORDER — SODIUM CHLORIDE 0.9 % IV SOLN: Freq: Once | INTRAVENOUS | Status: AC

## 2023-04-05 MED ORDER — SODIUM CHLORIDE 0.9 % IV SOLN
480.0000 mg | Freq: Once | INTRAVENOUS | Status: AC
  Administered 2023-04-05: 480 mg via INTRAVENOUS
  Filled 2023-04-05: qty 48

## 2023-04-05 NOTE — Patient Instructions (Signed)
[facility]  Discharge Instructions: Thank you for choosing [facility] to provide your oncology and hematology care.   If you have a lab appointment with the Cancer Center, please go directly to the Cancer Center and check in at the registration area.   Wear comfortable clothing and clothing appropriate for easy access to any Portacath or PICC line.   We strive to give you quality time with your provider. You may need to reschedule your appointment if you arrive late (15 or more minutes).  Arriving late affects you and other patients whose appointments are after yours.  Also, if you miss three or more appointments without notifying the office, you may be dismissed from the clinic at the provider's discretion.      For prescription refill requests, have your pharmacy contact our office and allow 72 hours for refills to be completed.    Today you received the following chemotherapy and/or immunotherapy agents: Opdivo      To help prevent nausea and vomiting after your treatment, we encourage you to take your nausea medication as directed.  BELOW ARE SYMPTOMS THAT SHOULD BE REPORTED IMMEDIATELY: *FEVER GREATER THAN 100.4 F (38 C) OR HIGHER *CHILLS OR SWEATING *NAUSEA AND VOMITING THAT IS NOT CONTROLLED WITH YOUR NAUSEA MEDICATION *UNUSUAL SHORTNESS OF BREATH *UNUSUAL BRUISING OR BLEEDING *URINARY PROBLEMS (pain or burning when urinating, or frequent urination) *BOWEL PROBLEMS (unusual diarrhea, constipation, pain near the anus) TENDERNESS IN MOUTH AND THROAT WITH OR WITHOUT PRESENCE OF ULCERS (sore throat, sores in mouth, or a toothache) UNUSUAL RASH, SWELLING OR PAIN  UNUSUAL VAGINAL DISCHARGE OR ITCHING   Items with * indicate a potential emergency and should be followed up as soon as possible or go to the Emergency Department if any problems should occur.  Please show the CHEMOTHERAPY ALERT CARD or IMMUNOTHERAPY ALERT CARD at check-in  to the Emergency Department and triage nurse.  Should you have questions after your visit or need to cancel or reschedule your appointment, please contact [facility]  Dept: [phone]  and follow the prompts.  Office hours are 8:00 a.m. to 4:30 p.m. Monday - Friday. Please note that voicemails left after 4:00 p.m. may not be returned until the following business day.  We are closed weekends and major holidays. You have access to a nurse at all times for urgent questions. Please call the main number to the clinic Dept: [phone] and follow the prompts.   For any non-urgent questions, you may also contact your provider using MyChart. We now offer e-Visits for anyone 18 and older to request care online for non-urgent symptoms. For details visit [email or web address].   Also download the MyChart app! Go to the app store, search "MyChart", open the app, select [place], and log in with your MyChart username and password.   

## 2023-04-07 LAB — T4: T4, Total: 4.9 ug/dL (ref 4.5–12.0)

## 2023-04-20 ENCOUNTER — Other Ambulatory Visit: Payer: Self-pay

## 2023-04-28 ENCOUNTER — Other Ambulatory Visit: Payer: Self-pay

## 2023-05-03 ENCOUNTER — Ambulatory Visit: Payer: No Typology Code available for payment source

## 2023-05-03 ENCOUNTER — Inpatient Hospital Stay: Payer: Medicare PPO

## 2023-05-03 ENCOUNTER — Inpatient Hospital Stay: Payer: Medicare PPO | Admitting: Dietician

## 2023-05-03 ENCOUNTER — Ambulatory Visit: Payer: No Typology Code available for payment source | Admitting: Internal Medicine

## 2023-05-03 ENCOUNTER — Other Ambulatory Visit: Payer: Self-pay

## 2023-05-03 ENCOUNTER — Inpatient Hospital Stay: Payer: Medicare PPO | Attending: Oncology

## 2023-05-03 ENCOUNTER — Inpatient Hospital Stay (HOSPITAL_BASED_OUTPATIENT_CLINIC_OR_DEPARTMENT_OTHER): Payer: Medicare PPO | Admitting: Internal Medicine

## 2023-05-03 ENCOUNTER — Encounter: Payer: Self-pay | Admitting: Medical Oncology

## 2023-05-03 ENCOUNTER — Other Ambulatory Visit: Payer: No Typology Code available for payment source

## 2023-05-03 VITALS — BP 129/62 | HR 67

## 2023-05-03 DIAGNOSIS — I10 Essential (primary) hypertension: Secondary | ICD-10-CM | POA: Diagnosis not present

## 2023-05-03 DIAGNOSIS — G4730 Sleep apnea, unspecified: Secondary | ICD-10-CM | POA: Diagnosis not present

## 2023-05-03 DIAGNOSIS — C642 Malignant neoplasm of left kidney, except renal pelvis: Secondary | ICD-10-CM | POA: Diagnosis not present

## 2023-05-03 DIAGNOSIS — C649 Malignant neoplasm of unspecified kidney, except renal pelvis: Secondary | ICD-10-CM | POA: Insufficient documentation

## 2023-05-03 DIAGNOSIS — N400 Enlarged prostate without lower urinary tract symptoms: Secondary | ICD-10-CM | POA: Diagnosis not present

## 2023-05-03 DIAGNOSIS — E119 Type 2 diabetes mellitus without complications: Secondary | ICD-10-CM | POA: Insufficient documentation

## 2023-05-03 DIAGNOSIS — Z79899 Other long term (current) drug therapy: Secondary | ICD-10-CM | POA: Diagnosis not present

## 2023-05-03 DIAGNOSIS — Z5112 Encounter for antineoplastic immunotherapy: Secondary | ICD-10-CM | POA: Insufficient documentation

## 2023-05-03 LAB — CBC WITH DIFFERENTIAL (CANCER CENTER ONLY)
Abs Immature Granulocytes: 0.01 10*3/uL (ref 0.00–0.07)
Basophils Absolute: 0 10*3/uL (ref 0.0–0.1)
Basophils Relative: 0 %
Eosinophils Absolute: 0.2 10*3/uL (ref 0.0–0.5)
Eosinophils Relative: 4 %
HCT: 33.4 % — ABNORMAL LOW (ref 39.0–52.0)
Hemoglobin: 10.5 g/dL — ABNORMAL LOW (ref 13.0–17.0)
Immature Granulocytes: 0 %
Lymphocytes Relative: 14 %
Lymphs Abs: 0.8 10*3/uL (ref 0.7–4.0)
MCH: 28.6 pg (ref 26.0–34.0)
MCHC: 31.4 g/dL (ref 30.0–36.0)
MCV: 91 fL (ref 80.0–100.0)
Monocytes Absolute: 0.6 10*3/uL (ref 0.1–1.0)
Monocytes Relative: 12 %
Neutro Abs: 3.6 10*3/uL (ref 1.7–7.7)
Neutrophils Relative %: 70 %
Platelet Count: 228 10*3/uL (ref 150–400)
RBC: 3.67 MIL/uL — ABNORMAL LOW (ref 4.22–5.81)
RDW: 12.7 % (ref 11.5–15.5)
WBC Count: 5.2 10*3/uL (ref 4.0–10.5)
nRBC: 0 % (ref 0.0–0.2)

## 2023-05-03 LAB — CMP (CANCER CENTER ONLY)
ALT: 8 U/L (ref 0–44)
AST: 12 U/L — ABNORMAL LOW (ref 15–41)
Albumin: 3.9 g/dL (ref 3.5–5.0)
Alkaline Phosphatase: 84 U/L (ref 38–126)
Anion gap: 5 (ref 5–15)
BUN: 16 mg/dL (ref 8–23)
CO2: 25 mmol/L (ref 22–32)
Calcium: 8.5 mg/dL — ABNORMAL LOW (ref 8.9–10.3)
Chloride: 106 mmol/L (ref 98–111)
Creatinine: 0.75 mg/dL (ref 0.61–1.24)
GFR, Estimated: >60 mL/min (ref >60)
Glucose, Bld: 120 mg/dL — ABNORMAL HIGH (ref 70–99)
Potassium: 4.3 mmol/L (ref 3.5–5.1)
Sodium: 136 mmol/L (ref 135–145)
Total Bilirubin: 0.4 mg/dL (ref 0.3–1.2)
Total Protein: 7.3 g/dL (ref 6.5–8.1)

## 2023-05-03 LAB — TSH: TSH: 1.155 u[IU]/mL (ref 0.350–4.500)

## 2023-05-03 MED ORDER — SODIUM CHLORIDE 0.9 % IV SOLN: Freq: Once | INTRAVENOUS | Status: AC

## 2023-05-03 MED ORDER — SODIUM CHLORIDE 0.9 % IV SOLN
480.0000 mg | Freq: Once | INTRAVENOUS | Status: AC
  Administered 2023-05-03: 480 mg via INTRAVENOUS
  Filled 2023-05-03: qty 48

## 2023-05-03 NOTE — Progress Notes (Signed)
[facility] [phone]   [phone]  OFFICE PROGRESS NOTE  System, Provider Not In No address on file  DIAGNOSIS: Stage IV intermediate risk clear-cell renal cell carcinoma diagnosed in September 2023 with abdominal adenopathy and pulmonary nodules.  PRIOR THERAPY:  1) Status post kidney biopsy on August 30, 2022 consistent with renal cell carcinoma. 2) Induction treatment with immunotherapy with ipilimumab 1 Mg/KG and nivolumab 3 mg/KG every 3 weeks started on September 21, 2022 status post 4 cycles with partial response.  CURRENT THERAPY: Maintenance treatment with nivolumab 480 Mg IV every 4 weeks first dose started December 14, 2022.  Status post 5 cycle of the maintenance therapy.  INTERVAL HISTORY: Anthony Sheppard 87 y.o. male returns to the clinic today for follow-up visit accompanied by his goddaughter.  The patient is feeling fine today with no concerning complaints.  He has no chest pain, shortness of breath, cough or hemoptysis.  He has no nausea, vomiting, diarrhea or constipation.  He has no headache or visual changes.  He denied having any fever or chills.  He continues to tolerate his treatment with nivolumab fairly well.  He is here for evaluation before starting cycle #10 of his treatment.  MEDICAL HISTORY: Past Medical History:  Diagnosis Date   Diabetes mellitus    Enlarged prostate    Hypertension    Sleep apnea     ALLERGIES:  is allergic to aspirin.  MEDICATIONS:  Current Outpatient Medications  Medication Sig Dispense Refill   ADVIL 200 MG CAPS Take 400 mg by mouth every 6 (six) hours as needed (for mild pain or headaches).     ALEVE 220 MG tablet Take 220-440 mg by mouth 2 (two) times daily as needed (for mild pain or headaches).     amLODipine (NORVASC) 5 MG tablet Take 5 mg by mouth daily.     cetirizine (ZYRTEC) 10 MG tablet Take 10 mg by mouth at bedtime.     finasteride (PROSCAR) 5 MG tablet Take 5 mg by mouth  at bedtime.     tamsulosin (FLOMAX) 0.4 MG CAPS capsule Take 0.4 mg by mouth at bedtime.      No current facility-administered medications for this visit.    SURGICAL HISTORY: No past surgical history on file.  REVIEW OF SYSTEMS:  A comprehensive review of systems was negative except for: Integument/breast: positive for pruritus   PHYSICAL EXAMINATION: General appearance: alert, cooperative, fatigued, and no distress Head: Normocephalic, without obvious abnormality, atraumatic Neck: no adenopathy, no JVD, supple, symmetrical, trachea midline, and thyroid not enlarged, symmetric, no tenderness/mass/nodules Lymph nodes: Cervical, supraclavicular, and axillary nodes normal. Resp: clear to auscultation bilaterally Back: symmetric, no curvature. ROM normal. No CVA tenderness. Cardio: regular rate and rhythm, S1, S2 normal, no murmur, click, rub or gallop GI: soft, non-tender; bowel sounds normal; no masses,  no organomegaly Extremities: extremities normal, atraumatic, no cyanosis or edema  ECOG PERFORMANCE STATUS: 1 - Symptomatic but completely ambulatory  Blood pressure (!) 142/70, pulse 63, temperature (!) 97.5 F (36.4 C), temperature source Oral, resp. rate 15, weight 120 lb 8 oz (54.7 kg), SpO2 100 %.  LABORATORY DATA: Lab Results  Component Value Date   WBC 5.2 05/03/2023   HGB 10.5 (L) 05/03/2023   HCT 33.4 (L) 05/03/2023   MCV 91.0 05/03/2023   PLT 228 05/03/2023      Chemistry      Component Value Date/Time   NA 139 04/05/2023 1037   K  3.9 04/05/2023 1037   CL 108 04/05/2023 1037   CO2 24 04/05/2023 1037   BUN 25 (H) 04/05/2023 1037   CREATININE 0.79 04/05/2023 1037      Component Value Date/Time   CALCIUM 8.8 (L) 04/05/2023 1037   ALKPHOS 81 04/05/2023 1037   AST 13 (L) 04/05/2023 1037   ALT 9 04/05/2023 1037   BILITOT 0.5 04/05/2023 1037       RADIOGRAPHIC STUDIES: No results found.  ASSESSMENT AND PLAN: This is a very pleasant 87 years old  African-American male with a stage IV intermediate risk clear-cell carcinoma diagnosed in September 2023 status post 4 cycles of induction treatment with immunotherapy with ipilimumab and nivolumab and starting from cycle #5 he is currently on maintenance treatment with nivolumab 480 mg IV every 4 weeks status post total of 9 cycles.  The patient has been tolerating this treatment well with no concerning adverse effect except for mild itching. I recommended for him to proceed with cycle #10 today as planned. I will see him back for follow-up visit in 4 weeks for evaluation before the next cycle of his treatment. He was advised to call immediately if he has any other concerning symptoms in the interval. The patient voices understanding of current disease status and treatment options and is in agreement with the current care plan.  All questions were answered. The patient knows to call the clinic with any problems, questions or concerns. We can certainly see the patient much sooner if necessary.  The total time spent in the appointment was 20 minutes.  Disclaimer: This note was dictated with voice recognition software. Similar sounding words can inadvertently be transcribed and may not be corrected upon review.

## 2023-05-03 NOTE — Progress Notes (Signed)
Nutrition Assessment   Reason for Assessment: Pt request   ASSESSMENT: 87 year old male with metastatic renal cell carcinoma. He is currently receiving maintenance nivolumab q4w. Patient is under the care of Dr. Shirline Frees.    Met with patient in infusion. He reports appetite is good. Patient eating a variety of foods and including good sources of lean proteins. Recalls omelettes, chicken, bratwurst, peanut butter crackers, peanuts. Patient does not eat pork. He enjoys coffee and drinks this through out the day and before bed. The caffeine does not keep him awake. Patient says he is drinking a good amount of water, but not as much as the doctor would like me to. Patient denies nutrition impact symptoms. He is asking what foods are best to eat for his sleep apnea.    Nutrition Focused Physical Exam: deferred   Medications: reviewed    Labs: glucose 120   Anthropometrics:   Height: 5'1" Weight: 120 lb 8 oz  UBW: 120 lb  BMI: 22.77   NUTRITION DIAGNOSIS: Food and nutrition related knowledge deficit related to chronic illness as evidenced by no prior need for associated nutrition information   INTERVENTION:  Encouraged small frequent meals and snacks with adequate calories and protein to maintain strength/weights Handout with snack ideas provided  Contact information given    MONITORING, EVALUATION, GOAL: weight trends, intake   Next Visit: No follow-up scheduled at this time. Pt encouraged to contact with nutrition questions/concerns

## 2023-05-03 NOTE — Patient Instructions (Signed)
[facility]  Discharge Instructions: Thank you for choosing [facility] to provide your oncology and hematology care.   If you have a lab appointment with the Cancer Center, please go directly to the Cancer Center and check in at the registration area.   Wear comfortable clothing and clothing appropriate for easy access to any Portacath or PICC line.   We strive to give you quality time with your provider. You may need to reschedule your appointment if you arrive late (15 or more minutes).  Arriving late affects you and other patients whose appointments are after yours.  Also, if you miss three or more appointments without notifying the office, you may be dismissed from the clinic at the provider's discretion.      For prescription refill requests, have your pharmacy contact our office and allow 72 hours for refills to be completed.    Today you received the following chemotherapy and/or immunotherapy agents: Opdivo      To help prevent nausea and vomiting after your treatment, we encourage you to take your nausea medication as directed.  BELOW ARE SYMPTOMS THAT SHOULD BE REPORTED IMMEDIATELY: *FEVER GREATER THAN 100.4 F (38 C) OR HIGHER *CHILLS OR SWEATING *NAUSEA AND VOMITING THAT IS NOT CONTROLLED WITH YOUR NAUSEA MEDICATION *UNUSUAL SHORTNESS OF BREATH *UNUSUAL BRUISING OR BLEEDING *URINARY PROBLEMS (pain or burning when urinating, or frequent urination) *BOWEL PROBLEMS (unusual diarrhea, constipation, pain near the anus) TENDERNESS IN MOUTH AND THROAT WITH OR WITHOUT PRESENCE OF ULCERS (sore throat, sores in mouth, or a toothache) UNUSUAL RASH, SWELLING OR PAIN  UNUSUAL VAGINAL DISCHARGE OR ITCHING   Items with * indicate a potential emergency and should be followed up as soon as possible or go to the Emergency Department if any problems should occur.  Please show the CHEMOTHERAPY ALERT CARD or IMMUNOTHERAPY ALERT CARD at check-in  to the Emergency Department and triage nurse.  Should you have questions after your visit or need to cancel or reschedule your appointment, please contact [facility]  Dept: [phone]  and follow the prompts.  Office hours are 8:00 a.m. to 4:30 p.m. Monday - Friday. Please note that voicemails left after 4:00 p.m. may not be returned until the following business day.  We are closed weekends and major holidays. You have access to a nurse at all times for urgent questions. Please call the main number to the clinic Dept: [phone] and follow the prompts.   For any non-urgent questions, you may also contact your provider using MyChart. We now offer e-Visits for anyone 18 and older to request care online for non-urgent symptoms. For details visit [email or web address].   Also download the MyChart app! Go to the app store, search "MyChart", open the app, select [place], and log in with your MyChart username and password.   

## 2023-05-03 NOTE — Progress Notes (Signed)
Patient seen by Dr. Mohamed  Vitals are within treatment parameters.  Labs reviewed: and are within treatment parameters.  Per physician team, patient is ready for treatment and there are NO modifications to the treatment plan.  

## 2023-05-05 LAB — T4: T4, Total: 4.8 ug/dL (ref 4.5–12.0)

## 2023-05-31 ENCOUNTER — Encounter: Payer: Self-pay | Admitting: Medical Oncology

## 2023-05-31 ENCOUNTER — Inpatient Hospital Stay (HOSPITAL_BASED_OUTPATIENT_CLINIC_OR_DEPARTMENT_OTHER): Payer: Medicare PPO | Admitting: Internal Medicine

## 2023-05-31 ENCOUNTER — Inpatient Hospital Stay: Payer: Medicare PPO

## 2023-05-31 ENCOUNTER — Other Ambulatory Visit: Payer: Self-pay

## 2023-05-31 ENCOUNTER — Inpatient Hospital Stay: Payer: Medicare PPO | Attending: Oncology

## 2023-05-31 VITALS — BP 131/61 | HR 65 | Temp 98.2°F | Resp 17 | Wt 125.0 lb

## 2023-05-31 DIAGNOSIS — R918 Other nonspecific abnormal finding of lung field: Secondary | ICD-10-CM | POA: Diagnosis not present

## 2023-05-31 DIAGNOSIS — G4730 Sleep apnea, unspecified: Secondary | ICD-10-CM | POA: Insufficient documentation

## 2023-05-31 DIAGNOSIS — C649 Malignant neoplasm of unspecified kidney, except renal pelvis: Secondary | ICD-10-CM | POA: Diagnosis not present

## 2023-05-31 DIAGNOSIS — N400 Enlarged prostate without lower urinary tract symptoms: Secondary | ICD-10-CM | POA: Diagnosis not present

## 2023-05-31 DIAGNOSIS — E119 Type 2 diabetes mellitus without complications: Secondary | ICD-10-CM | POA: Diagnosis not present

## 2023-05-31 DIAGNOSIS — Z79899 Other long term (current) drug therapy: Secondary | ICD-10-CM | POA: Insufficient documentation

## 2023-05-31 DIAGNOSIS — I10 Essential (primary) hypertension: Secondary | ICD-10-CM | POA: Diagnosis not present

## 2023-05-31 DIAGNOSIS — Z9221 Personal history of antineoplastic chemotherapy: Secondary | ICD-10-CM | POA: Insufficient documentation

## 2023-05-31 DIAGNOSIS — C642 Malignant neoplasm of left kidney, except renal pelvis: Secondary | ICD-10-CM

## 2023-05-31 DIAGNOSIS — Z5112 Encounter for antineoplastic immunotherapy: Secondary | ICD-10-CM | POA: Diagnosis not present

## 2023-05-31 DIAGNOSIS — R590 Localized enlarged lymph nodes: Secondary | ICD-10-CM | POA: Insufficient documentation

## 2023-05-31 LAB — CBC WITH DIFFERENTIAL (CANCER CENTER ONLY)
Abs Immature Granulocytes: 0.01 10*3/uL (ref 0.00–0.07)
Basophils Absolute: 0 10*3/uL (ref 0.0–0.1)
Basophils Relative: 1 %
Eosinophils Absolute: 0.3 10*3/uL (ref 0.0–0.5)
Eosinophils Relative: 6 %
HCT: 32 % — ABNORMAL LOW (ref 39.0–52.0)
Hemoglobin: 10.1 g/dL — ABNORMAL LOW (ref 13.0–17.0)
Immature Granulocytes: 0 %
Lymphocytes Relative: 14 %
Lymphs Abs: 0.8 10*3/uL (ref 0.7–4.0)
MCH: 28.6 pg (ref 26.0–34.0)
MCHC: 31.6 g/dL (ref 30.0–36.0)
MCV: 90.7 fL (ref 80.0–100.0)
Monocytes Absolute: 0.6 10*3/uL (ref 0.1–1.0)
Monocytes Relative: 11 %
Neutro Abs: 3.9 10*3/uL (ref 1.7–7.7)
Neutrophils Relative %: 68 %
Platelet Count: 224 10*3/uL (ref 150–400)
RBC: 3.53 MIL/uL — ABNORMAL LOW (ref 4.22–5.81)
RDW: 13.1 % (ref 11.5–15.5)
WBC Count: 5.7 10*3/uL (ref 4.0–10.5)
nRBC: 0 % (ref 0.0–0.2)

## 2023-05-31 LAB — CMP (CANCER CENTER ONLY)
ALT: 8 U/L (ref 0–44)
AST: 12 U/L — ABNORMAL LOW (ref 15–41)
Albumin: 3.8 g/dL (ref 3.5–5.0)
Alkaline Phosphatase: 77 U/L (ref 38–126)
Anion gap: 6 (ref 5–15)
BUN: 25 mg/dL — ABNORMAL HIGH (ref 8–23)
CO2: 24 mmol/L (ref 22–32)
Calcium: 8.6 mg/dL — ABNORMAL LOW (ref 8.9–10.3)
Chloride: 108 mmol/L (ref 98–111)
Creatinine: 0.79 mg/dL (ref 0.61–1.24)
GFR, Estimated: >60 mL/min (ref >60)
Glucose, Bld: 147 mg/dL — ABNORMAL HIGH (ref 70–99)
Potassium: 4.3 mmol/L (ref 3.5–5.1)
Sodium: 138 mmol/L (ref 135–145)
Total Bilirubin: 0.3 mg/dL (ref 0.3–1.2)
Total Protein: 7.3 g/dL (ref 6.5–8.1)

## 2023-05-31 LAB — TSH: TSH: 0.821 u[IU]/mL (ref 0.350–4.500)

## 2023-05-31 MED ORDER — SODIUM CHLORIDE 0.9 % IV SOLN: Freq: Once | INTRAVENOUS | Status: AC

## 2023-05-31 MED ORDER — SODIUM CHLORIDE 0.9 % IV SOLN
480.0000 mg | Freq: Once | INTRAVENOUS | Status: AC
  Administered 2023-05-31: 480 mg via INTRAVENOUS
  Filled 2023-05-31: qty 48

## 2023-05-31 NOTE — Patient Instructions (Signed)
[facility]  Discharge Instructions: Thank you for choosing [facility] to provide your oncology and hematology care.   If you have a lab appointment with the Cancer Center, please go directly to the Cancer Center and check in at the registration area.   Wear comfortable clothing and clothing appropriate for easy access to any Portacath or PICC line.   We strive to give you quality time with your provider. You may need to reschedule your appointment if you arrive late (15 or more minutes).  Arriving late affects you and other patients whose appointments are after yours.  Also, if you miss three or more appointments without notifying the office, you may be dismissed from the clinic at the provider's discretion.      For prescription refill requests, have your pharmacy contact our office and allow 72 hours for refills to be completed.    Today you received the following chemotherapy and/or immunotherapy agents opdivo      To help prevent nausea and vomiting after your treatment, we encourage you to take your nausea medication as directed.  BELOW ARE SYMPTOMS THAT SHOULD BE REPORTED IMMEDIATELY: *FEVER GREATER THAN 100.4 F (38 C) OR HIGHER *CHILLS OR SWEATING *NAUSEA AND VOMITING THAT IS NOT CONTROLLED WITH YOUR NAUSEA MEDICATION *UNUSUAL SHORTNESS OF BREATH *UNUSUAL BRUISING OR BLEEDING *URINARY PROBLEMS (pain or burning when urinating, or frequent urination) *BOWEL PROBLEMS (unusual diarrhea, constipation, pain near the anus) TENDERNESS IN MOUTH AND THROAT WITH OR WITHOUT PRESENCE OF ULCERS (sore throat, sores in mouth, or a toothache) UNUSUAL RASH, SWELLING OR PAIN  UNUSUAL VAGINAL DISCHARGE OR ITCHING   Items with * indicate a potential emergency and should be followed up as soon as possible or go to the Emergency Department if any problems should occur.  Please show the CHEMOTHERAPY ALERT CARD or IMMUNOTHERAPY ALERT CARD at check-in  to the Emergency Department and triage nurse.  Should you have questions after your visit or need to cancel or reschedule your appointment, please contact [facility]  Dept: [phone]  and follow the prompts.  Office hours are 8:00 a.m. to 4:30 p.m. Monday - Friday. Please note that voicemails left after 4:00 p.m. may not be returned until the following business day.  We are closed weekends and major holidays. You have access to a nurse at all times for urgent questions. Please call the main number to the clinic Dept: [phone] and follow the prompts.   For any non-urgent questions, you may also contact your provider using MyChart. We now offer e-Visits for anyone 18 and older to request care online for non-urgent symptoms. For details visit [email or web address].   Also download the MyChart app! Go to the app store, search "MyChart", open the app, select [facility], and log in with your MyChart username and password.   

## 2023-05-31 NOTE — Progress Notes (Signed)
Patient seen by Dr. Mohamed  Vitals are within treatment parameters.  Labs reviewed: and are within treatment parameters.  Per physician team, patient is ready for treatment and there are NO modifications to the treatment plan.   Nivolumab 

## 2023-05-31 NOTE — Progress Notes (Signed)
[facility] [phone]   [phone]  OFFICE PROGRESS NOTE  System, Provider Not In No address on file  DIAGNOSIS: Stage IV intermediate risk clear-cell renal cell carcinoma diagnosed in September 2023 with abdominal adenopathy and pulmonary nodules.  PRIOR THERAPY:  1) Status post kidney biopsy on August 30, 2022 consistent with renal cell carcinoma. 2) Induction treatment with immunotherapy with ipilimumab 1 Mg/KG and nivolumab 3 mg/KG every 3 weeks started on September 21, 2022 status post 4 cycles with partial response.  CURRENT THERAPY: Maintenance treatment with nivolumab 480 Mg IV every 4 weeks first dose started December 14, 2022.  Status post 6 cycle of the maintenance therapy.  INTERVAL HISTORY: Anthony Sheppard 87 y.o. male turns to the clinic today for follow-up visit accompanied by his goddaughter Rosey Bath.  The patient is feeling fine today with no concerning complaints except for mild fatigue.  He also has occasional blood in his stool secondary to hemorrhoids.  He denied having any current nausea, vomiting, diarrhea or constipation.  He has no chest pain, shortness of breath, cough or hemoptysis.  He has no fever or chills.  He has no significant weight loss or night sweats.  He is here today for evaluation before starting cycle #11 of his treatment.  MEDICAL HISTORY: Past Medical History:  Diagnosis Date   Diabetes mellitus    Enlarged prostate    Hypertension    Sleep apnea     ALLERGIES:  is allergic to aspirin.  MEDICATIONS:  Current Outpatient Medications  Medication Sig Dispense Refill   ADVIL 200 MG CAPS Take 400 mg by mouth every 6 (six) hours as needed (for mild pain or headaches).     ALEVE 220 MG tablet Take 220-440 mg by mouth 2 (two) times daily as needed (for mild pain or headaches).     amLODipine (NORVASC) 5 MG tablet Take 5 mg by mouth daily.     cetirizine (ZYRTEC) 10 MG tablet Take 10 mg by mouth at bedtime.      finasteride (PROSCAR) 5 MG tablet Take 5 mg by mouth at bedtime.     tamsulosin (FLOMAX) 0.4 MG CAPS capsule Take 0.4 mg by mouth at bedtime.      No current facility-administered medications for this visit.    SURGICAL HISTORY: No past surgical history on file.  REVIEW OF SYSTEMS:  A comprehensive review of systems was negative except for: Constitutional: positive for fatigue   PHYSICAL EXAMINATION: General appearance: alert, cooperative, and no distress Head: Normocephalic, without obvious abnormality, atraumatic Neck: no adenopathy, no JVD, supple, symmetrical, trachea midline, and thyroid not enlarged, symmetric, no tenderness/mass/nodules Lymph nodes: Cervical, supraclavicular, and axillary nodes normal. Resp: clear to auscultation bilaterally Back: symmetric, no curvature. ROM normal. No CVA tenderness. Cardio: regular rate and rhythm, S1, S2 normal, no murmur, click, rub or gallop GI: soft, non-tender; bowel sounds normal; no masses,  no organomegaly Extremities: extremities normal, atraumatic, no cyanosis or edema  ECOG PERFORMANCE STATUS: 1 - Symptomatic but completely ambulatory  Blood pressure 131/61, pulse 65, temperature 98.2 F (36.8 C), temperature source Oral, resp. rate 17, weight 125 lb (56.7 kg), SpO2 96 %.  LABORATORY DATA: Lab Results  Component Value Date   WBC 5.7 05/31/2023   HGB 10.1 (L) 05/31/2023   HCT 32.0 (L) 05/31/2023   MCV 90.7 05/31/2023   PLT 224 05/31/2023      Chemistry      Component Value Date/Time   NA 136  05/03/2023 1009   K 4.3 05/03/2023 1009   CL 106 05/03/2023 1009   CO2 25 05/03/2023 1009   BUN 16 05/03/2023 1009   CREATININE 0.75 05/03/2023 1009      Component Value Date/Time   CALCIUM 8.5 (L) 05/03/2023 1009   ALKPHOS 84 05/03/2023 1009   AST 12 (L) 05/03/2023 1009   ALT 8 05/03/2023 1009   BILITOT 0.4 05/03/2023 1009       RADIOGRAPHIC STUDIES: No results found.  ASSESSMENT AND PLAN: This is a very pleasant  87 years old African-American male with a stage IV intermediate risk clear-cell carcinoma diagnosed in September 2023 status post 4 cycles of induction treatment with immunotherapy with ipilimumab and nivolumab and starting from cycle #5 he is currently on maintenance treatment with nivolumab 480 mg IV every 4 weeks status post total of 10 cycles.  The patient has been tolerating this treatment well with no concerning adverse effects. I recommended for him to proceed with cycle #11 today as planned. I will see him back for follow-up visit in 4 weeks for evaluation with repeat CT scan of the chest, abdomen and pelvis for restaging of his disease. The patient was advised to call immediately if he has any other concerning symptoms in the interval. The patient voices understanding of current disease status and treatment options and is in agreement with the current care plan.  All questions were answered. The patient knows to call the clinic with any problems, questions or concerns. We can certainly see the patient much sooner if necessary.  The total time spent in the appointment was 20 minutes.  Disclaimer: This note was dictated with voice recognition software. Similar sounding words can inadvertently be transcribed and may not be corrected upon review.

## 2023-06-01 ENCOUNTER — Other Ambulatory Visit: Payer: Self-pay

## 2023-06-02 ENCOUNTER — Other Ambulatory Visit: Payer: Self-pay

## 2023-06-02 LAB — T4: T4, Total: 4.8 ug/dL (ref 4.5–12.0)

## 2023-06-14 ENCOUNTER — Ambulatory Visit (HOSPITAL_COMMUNITY)
Admission: RE | Admit: 2023-06-14 | Discharge: 2023-06-14 | Disposition: A | Discharge status: Home / Self Care | Payer: Medicare PPO | Source: Ambulatory Visit | Attending: Internal Medicine | Admitting: Internal Medicine

## 2023-06-14 DIAGNOSIS — C642 Malignant neoplasm of left kidney, except renal pelvis: Secondary | ICD-10-CM | POA: Insufficient documentation

## 2023-06-27 ENCOUNTER — Telehealth: Payer: Self-pay | Admitting: Internal Medicine

## 2023-06-28 ENCOUNTER — Inpatient Hospital Stay: Payer: Medicare PPO

## 2023-06-28 ENCOUNTER — Other Ambulatory Visit: Payer: Self-pay

## 2023-06-28 ENCOUNTER — Inpatient Hospital Stay: Payer: Medicare PPO | Admitting: Internal Medicine

## 2023-06-28 DIAGNOSIS — R972 Elevated prostate specific antigen [PSA]: Secondary | ICD-10-CM | POA: Diagnosis not present

## 2023-06-28 DIAGNOSIS — R3912 Poor urinary stream: Secondary | ICD-10-CM | POA: Diagnosis not present

## 2023-06-28 DIAGNOSIS — D49512 Neoplasm of unspecified behavior of left kidney: Secondary | ICD-10-CM | POA: Diagnosis not present

## 2023-06-28 DIAGNOSIS — R3915 Urgency of urination: Secondary | ICD-10-CM | POA: Diagnosis not present

## 2023-07-02 ENCOUNTER — Encounter: Payer: Self-pay | Admitting: Internal Medicine

## 2023-07-02 NOTE — Progress Notes (Signed)
[facility] OFFICE PROGRESS NOTE  System, Provider Not In No address on file  DIAGNOSIS: Stage IV intermediate risk clear-cell renal cell carcinoma diagnosed in September 2023 with abdominal adenopathy and pulmonary nodules.   PRIOR THERAPY: 1) Status post kidney biopsy on August 30, 2022 consistent with renal cell carcinoma. 2) Induction treatment with immunotherapy with ipilimumab 1 Mg/KG and nivolumab 3 mg/KG every 3 weeks started on September 21, 2022 status post 4 cycles with partial response.   CURRENT THERAPY: Maintenance treatment with nivolumab 480 Mg IV every 4 weeks first dose started December 14, 2022.  Status post 6 cycle of the maintenance therapy.   INTERVAL HISTORY: Anthony Sheppard 87 y.o. male returns to the clinic today for a follow-up visit accompanied by his goddaughter.  The patient is feeling well today without any concerning complaints. He is currently undergoing immunotherapy with nivolumab IV every 4 weeks and is tolerating it well without any concerning adverse side effects.  He denies any recent fever, chills, or night sweats. He lost 2 lbs since last being seen.  He has seen a member the nutritionist team in the past.  He is not drinking any protein supplemental drinks as recommended.  Denies any nausea, vomiting, diarrhea, or constipation.  He mentions he sometimes has blood when he is wiping after a bowel movement.  He reports this has happened "seldomly".  He states he was told he had hemorrhoids in the past.  Denies any chest pain, shortness of breath, cough, or hemoptysis.  Denies any rashes or skin changes except she sometimes has itching, primarily in the bottom of his feet.  He takes Zyrtec daily. Denies any unusual abdominal pain or back pain.  Denies any hematuria, malodorous urine, or cloudy urine. He recently had a restaging CT scan performed.  He is here today for evaluation and repeat blood work before undergoing cycle #8 of maintenance  nivolumab.    MEDICAL HISTORY: Past Medical History:  Diagnosis Date   Diabetes mellitus    Enlarged prostate    Hypertension    Sleep apnea     ALLERGIES:  is allergic to aspirin.  MEDICATIONS:  Current Outpatient Medications  Medication Sig Dispense Refill   ADVIL 200 MG CAPS Take 400 mg by mouth every 6 (six) hours as needed (for mild pain or headaches).     ALEVE 220 MG tablet Take 220-440 mg by mouth 2 (two) times daily as needed (for mild pain or headaches).     amLODipine (NORVASC) 5 MG tablet Take 5 mg by mouth daily.     cetirizine (ZYRTEC) 10 MG tablet Take 10 mg by mouth at bedtime.     finasteride (PROSCAR) 5 MG tablet Take 5 mg by mouth at bedtime.     tamsulosin (FLOMAX) 0.4 MG CAPS capsule Take 0.4 mg by mouth at bedtime.      No current facility-administered medications for this visit.    SURGICAL HISTORY: No past surgical history on file.  REVIEW OF SYSTEMS:   Review of Systems  Constitutional: Positive for 2 lb weight loss. Negative for appetite change, chills, fatigue, and fever.  HENT: Negative for mouth sores, nosebleeds, sore throat and trouble swallowing.   Eyes: Negative for eye problems and icterus.  Respiratory: Negative for cough, hemoptysis, shortness of breath and wheezing.   Cardiovascular: Negative for chest pain and leg swelling.  Gastrointestinal: Negative for abdominal pain, constipation, diarrhea, nausea and vomiting.  Genitourinary: Negative for bladder incontinence, difficulty urinating, dysuria, frequency and  hematuria.   Musculoskeletal: Negative for back pain, gait problem, neck pain and neck stiffness.  Skin: Positive for itching the bottom of his feet.  Neurological: Negative for dizziness, extremity weakness, gait problem, headaches, light-headedness and seizures.  Hematological: Negative for adenopathy. Does not bruise/bleed easily.  Psychiatric/Behavioral: Negative for confusion, depression and sleep disturbance. The patient is  not nervous/anxious.     PHYSICAL EXAMINATION:  Blood pressure 134/80, pulse 66, temperature 98.2 F (36.8 C), temperature source Oral, resp. rate 13, weight 123 lb 1.6 oz (55.8 kg), SpO2 98%.  ECOG PERFORMANCE STATUS: 1  Physical Exam  Constitutional: Oriented to person, place, and time and elderly appearing male and in no distress.  HENT:  Head: Normocephalic and atraumatic.  Mouth/Throat: Oropharynx is clear and moist. No oropharyngeal exudate.  Eyes: Conjunctivae are normal. Right eye exhibits no discharge. Left eye exhibits no discharge. No scleral icterus.  Neck: Normal range of motion. Neck supple.  Cardiovascular: Normal rate, regular rhythm, normal heart sounds and intact distal pulses.   Pulmonary/Chest: Effort normal and breath sounds normal. No respiratory distress. No wheezes. No rales.  Abdominal: Soft. Bowel sounds are normal. Exhibits no distension and no mass. There is no tenderness.  Musculoskeletal: Normal range of motion. Exhibits no edema.  Lymphadenopathy:    No cervical adenopathy.  Neurological: Alert and oriented to person, place, and time. Exhibits normal muscle tone. Gait normal. Coordination normal.  Skin: Skin is warm and dry. No rash noted. Not diaphoretic. No erythema. No pallor.  Psychiatric: Mood, memory and judgment normal.  Vitals reviewed.  LABORATORY DATA: Lab Results  Component Value Date   WBC 5.8 07/05/2023   HGB 10.3 (L) 07/05/2023   HCT 31.8 (L) 07/05/2023   MCV 90.1 07/05/2023   PLT 206 07/05/2023      Chemistry      Component Value Date/Time   NA 137 07/05/2023 1332   K 4.6 07/05/2023 1332   CL 106 07/05/2023 1332   CO2 25 07/05/2023 1332   BUN 27 (H) 07/05/2023 1332   CREATININE 0.82 07/05/2023 1332      Component Value Date/Time   CALCIUM 8.7 (L) 07/05/2023 1332   ALKPHOS 87 07/05/2023 1332   AST 15 07/05/2023 1332   ALT 8 07/05/2023 1332   BILITOT 0.3 07/05/2023 1332       RADIOGRAPHIC STUDIES:  CT Chest Wo  Contrast  Result Date: 06/18/2023 CLINICAL DATA:  Renal cell carcinoma, metastatic. Restaging. * Tracking Code: BO *. EXAM: CT CHEST, ABDOMEN AND PELVIS WITHOUT CONTRAST TECHNIQUE: Multidetector CT imaging of the chest, abdomen and pelvis was performed following the standard protocol without IV contrast. RADIATION DOSE REDUCTION: This exam was performed according to the departmental dose-optimization program which includes automated exposure control, adjustment of the mA and/or kV according to patient size and/or use of iterative reconstruction technique. COMPARISON:  PET-CT 12/02/2022 FINDINGS: CT CHEST FINDINGS Cardiovascular: The heart size is normal. Aortic atherosclerosis and coronary artery calcification. No pericardial effusion. Mediastinum/Nodes: Enlarged and heterogeneous thyroid gland is identified. Recommend thyroid ultrasound (ref: J Am Coll Radiol. 2015 Feb;12(2): 143-50). The trachea appears patent and midline. No enlarged mediastinal or axillary lymph nodes. Hilar lymph nodes are suboptimally evaluated due to lack of IV contrast material. Lungs/Pleura: No pleural fluid, airspace consolidation, atelectasis or pneumothorax. Nodule within the posteromedial left upper lobe measures 3 mm, image 36/509. Unchanged compared with the previous exam. Stable perifissural nodule within the oblique fissure of the left lung measuring 3 mm, image 78/509. No new or  enlarging lung nodules. Musculoskeletal: Lytic lesion with pathologic fracture involving the T12 vertebra is again noted, image 94/508 and image 111/509. This is grossly unchanged compared with the previous exam. No new bone lesions. CT ABDOMEN PELVIS FINDINGS Hepatobiliary: Multiple low-attenuation liver lesions are unchanged when compared with the previous exam and likely represent liver cysts. The gallbladder appears decompressed. No bile duct dilatation. Pancreas: Unremarkable. No pancreatic ductal dilatation or surrounding inflammatory changes.  Spleen: Normal in size without focal abnormality. Adrenals/Urinary Tract: Normal adrenal glands. No nephrolithiasis, hydronephrosis or suspicious mass. The left kidney mass is suboptimally visualized reflecting lack of IV contrast material. The unenhanced appearance of the left kidney appears stable compared with the PET-CT from 12/02/2022. No nephrolithiasis or hydronephrosis identified bilaterally. Simple cyst off the inferior pole of the right kidney measures 2 cm, image 58/505. No follow-up imaging recommended. Diffuse circumferential wall thickening of the urinary bladder is noted which appears partially decompressed. Findings are favored to represent sequelae of chronic bladder outlet obstruction secondary to prostate gland hypertrophy. Stomach/Bowel: Stomach is normal. No pathologic dilatation of the large or small bowel loops. Moderate retained stool noted within the colon. Vascular/Lymphatic: Aortic atherosclerosis. No signs abdominopelvic adenopathy. Reproductive: Prostate gland enlargement. Other: No ascites or focal fluid collections. Musculoskeletal: No acute or significant osseous findings. Lumbar degenerative disc disease. IMPRESSION: 1. Stable CT appearance of the chest, abdomen and pelvis. 2. Stable lytic lesion with pathologic fracture involving the T12 vertebra. 3. Stable small lung nodules. 4. Stable low-attenuation liver lesions, likely liver cysts. 5. Left kidney mass is suboptimally visualized reflecting lack of IV contrast material. No change in the unenhanced appearance of the left kidney compared with previous exam. 6. Prostate gland enlargement 7. Heterogeneous and enlarged thyroid gland. Recommend thyroid ultrasound (ref: J Am Coll Radiol. 2015 Feb;12(2): 143-50). 8. Aortic Atherosclerosis (ICD10-I70.0). Electronically Signed   By: Signa Kell M.D.   On: 06/18/2023 12:32   CT Abdomen Pelvis Wo Contrast  Result Date: 06/18/2023 CLINICAL DATA:  Renal cell carcinoma, metastatic.  Restaging. * Tracking Code: BO *. EXAM: CT CHEST, ABDOMEN AND PELVIS WITHOUT CONTRAST TECHNIQUE: Multidetector CT imaging of the chest, abdomen and pelvis was performed following the standard protocol without IV contrast. RADIATION DOSE REDUCTION: This exam was performed according to the departmental dose-optimization program which includes automated exposure control, adjustment of the mA and/or kV according to patient size and/or use of iterative reconstruction technique. COMPARISON:  PET-CT 12/02/2022 FINDINGS: CT CHEST FINDINGS Cardiovascular: The heart size is normal. Aortic atherosclerosis and coronary artery calcification. No pericardial effusion. Mediastinum/Nodes: Enlarged and heterogeneous thyroid gland is identified. Recommend thyroid ultrasound (ref: J Am Coll Radiol. 2015 Feb;12(2): 143-50). The trachea appears patent and midline. No enlarged mediastinal or axillary lymph nodes. Hilar lymph nodes are suboptimally evaluated due to lack of IV contrast material. Lungs/Pleura: No pleural fluid, airspace consolidation, atelectasis or pneumothorax. Nodule within the posteromedial left upper lobe measures 3 mm, image 36/509. Unchanged compared with the previous exam. Stable perifissural nodule within the oblique fissure of the left lung measuring 3 mm, image 78/509. No new or enlarging lung nodules. Musculoskeletal: Lytic lesion with pathologic fracture involving the T12 vertebra is again noted, image 94/508 and image 111/509. This is grossly unchanged compared with the previous exam. No new bone lesions. CT ABDOMEN PELVIS FINDINGS Hepatobiliary: Multiple low-attenuation liver lesions are unchanged when compared with the previous exam and likely represent liver cysts. The gallbladder appears decompressed. No bile duct dilatation. Pancreas: Unremarkable. No pancreatic ductal dilatation or surrounding inflammatory  changes. Spleen: Normal in size without focal abnormality. Adrenals/Urinary Tract: Normal adrenal  glands. No nephrolithiasis, hydronephrosis or suspicious mass. The left kidney mass is suboptimally visualized reflecting lack of IV contrast material. The unenhanced appearance of the left kidney appears stable compared with the PET-CT from 12/02/2022. No nephrolithiasis or hydronephrosis identified bilaterally. Simple cyst off the inferior pole of the right kidney measures 2 cm, image 58/505. No follow-up imaging recommended. Diffuse circumferential wall thickening of the urinary bladder is noted which appears partially decompressed. Findings are favored to represent sequelae of chronic bladder outlet obstruction secondary to prostate gland hypertrophy. Stomach/Bowel: Stomach is normal. No pathologic dilatation of the large or small bowel loops. Moderate retained stool noted within the colon. Vascular/Lymphatic: Aortic atherosclerosis. No signs abdominopelvic adenopathy. Reproductive: Prostate gland enlargement. Other: No ascites or focal fluid collections. Musculoskeletal: No acute or significant osseous findings. Lumbar degenerative disc disease. IMPRESSION: 1. Stable CT appearance of the chest, abdomen and pelvis. 2. Stable lytic lesion with pathologic fracture involving the T12 vertebra. 3. Stable small lung nodules. 4. Stable low-attenuation liver lesions, likely liver cysts. 5. Left kidney mass is suboptimally visualized reflecting lack of IV contrast material. No change in the unenhanced appearance of the left kidney compared with previous exam. 6. Prostate gland enlargement 7. Heterogeneous and enlarged thyroid gland. Recommend thyroid ultrasound (ref: J Am Coll Radiol. 2015 Feb;12(2): 143-50). 8. Aortic Atherosclerosis (ICD10-I70.0). Electronically Signed   By: Signa Kell M.D.   On: 06/18/2023 12:32     ASSESSMENT/PLAN:  This is a very pleasant 87 year old African-American male with a stage IV intermediate risk clear-cell carcinoma diagnosed in September 2023 status post 4 cycles of induction  treatment with immunotherapy with ipilimumab and nivolumab and he is currently on maintenance treatment with nivolumab 480 mg IV every 4 weeks status post total of 7 cycles.  The patient had good response after cycle #4. He is currently tolerating his treatment with the immunotherapy fairly well with no concerning adverse effect except for itching   The patient was seen with Dr. Arbutus Ped today.  Dr. Arbutus Ped personally and independently reviewed the scan and discussed results with the patient today.  The scan showed no evidence of disease progression.  Dr. Arbutus Ped recommends he continue on the same treatment at the same dose.   I recommended for him to proceed with cycle #12 today as planned.   For the itching he will apply skin lotion and use I let him know he can use zyrtec or claritin for itching on as-needed basis. I am concerned that benadryl would make him too drowsy with his age. For localized itching, advised to use hydrocortisone cream   I will see him back for follow-up visit in 4 weeks for evaluation before starting cycle #13.  The patient was advised to call immediately if she has any concerning symptoms in the interval. The patient voices understanding of current disease status and treatment options and is in agreement with the current care plan. All questions were answered. The patient knows to call the clinic with any problems, questions or concerns. We can certainly see the patient much sooner if necessary  No orders of the defined types were placed in this encounter.    Jamye Balicki L Ruperto Kiernan, PA-C 07/05/23  ADDENDUM: Hematology/Oncology Attending: I had a face-to-face encounter with the patient today.  I reviewed his record, lab, scan and recommended his care plan.  This is a very pleasant 87 years old African-American male with a stage IV intermediate  risk clear-cell renal cell carcinoma diagnosed in September 2023 presented with abdominal lymphadenopathy and pulmonary  nodules status post kidney biopsy that was consistent with renal cell carcinoma.  The patient underwent induction treatment with immunotherapy with ipilimumab and nivolumab every 3 weeks for 4 cycles and he is currently on maintenance treatment with nivolumab 480 Mg IV every 4 weeks status post 6 cycles.  He has been tolerating his treatment well with no concerning adverse effects. The patient had repeat CT scan of the chest, abdomen and pelvis performed recently.  I personally and independently reviewed the scan and discussed the result with the patient today. His scan showed no concerning findings for disease progression. I recommended for him to continue his current maintenance treatment and he will proceed with cycle #7 today. We will see the patient back for follow-up visit in 4 weeks for evaluation before the next cycle of his treatment. He was advised to call immediately if he has any other concerning symptoms in the interval. The total time spent in the appointment was 30 minutes. Disclaimer: This note was dictated with voice recognition software. Similar sounding words can inadvertently be transcribed and may be missed upon review. Lajuana Matte, MD 07/05/23

## 2023-07-05 ENCOUNTER — Inpatient Hospital Stay: Payer: Medicare PPO | Attending: Oncology

## 2023-07-05 ENCOUNTER — Inpatient Hospital Stay (HOSPITAL_BASED_OUTPATIENT_CLINIC_OR_DEPARTMENT_OTHER): Payer: Medicare PPO | Admitting: Physician Assistant

## 2023-07-05 ENCOUNTER — Other Ambulatory Visit: Payer: Self-pay

## 2023-07-05 ENCOUNTER — Inpatient Hospital Stay: Payer: Medicare PPO

## 2023-07-05 VITALS — BP 119/58 | HR 62 | Resp 16

## 2023-07-05 DIAGNOSIS — I10 Essential (primary) hypertension: Secondary | ICD-10-CM | POA: Insufficient documentation

## 2023-07-05 DIAGNOSIS — C642 Malignant neoplasm of left kidney, except renal pelvis: Secondary | ICD-10-CM

## 2023-07-05 DIAGNOSIS — E119 Type 2 diabetes mellitus without complications: Secondary | ICD-10-CM | POA: Insufficient documentation

## 2023-07-05 DIAGNOSIS — Z9221 Personal history of antineoplastic chemotherapy: Secondary | ICD-10-CM | POA: Insufficient documentation

## 2023-07-05 DIAGNOSIS — C649 Malignant neoplasm of unspecified kidney, except renal pelvis: Secondary | ICD-10-CM | POA: Diagnosis not present

## 2023-07-05 DIAGNOSIS — G4730 Sleep apnea, unspecified: Secondary | ICD-10-CM | POA: Diagnosis not present

## 2023-07-05 DIAGNOSIS — Z5112 Encounter for antineoplastic immunotherapy: Secondary | ICD-10-CM | POA: Insufficient documentation

## 2023-07-05 DIAGNOSIS — N400 Enlarged prostate without lower urinary tract symptoms: Secondary | ICD-10-CM | POA: Insufficient documentation

## 2023-07-05 DIAGNOSIS — R918 Other nonspecific abnormal finding of lung field: Secondary | ICD-10-CM | POA: Insufficient documentation

## 2023-07-05 DIAGNOSIS — Z79899 Other long term (current) drug therapy: Secondary | ICD-10-CM | POA: Insufficient documentation

## 2023-07-05 LAB — CBC WITH DIFFERENTIAL (CANCER CENTER ONLY)
Abs Immature Granulocytes: 0.01 10*3/uL (ref 0.00–0.07)
Basophils Absolute: 0 10*3/uL (ref 0.0–0.1)
Basophils Relative: 1 %
Eosinophils Absolute: 0.2 10*3/uL (ref 0.0–0.5)
Eosinophils Relative: 4 %
HCT: 31.8 % — ABNORMAL LOW (ref 39.0–52.0)
Hemoglobin: 10.3 g/dL — ABNORMAL LOW (ref 13.0–17.0)
Immature Granulocytes: 0 %
Lymphocytes Relative: 14 %
Lymphs Abs: 0.8 10*3/uL (ref 0.7–4.0)
MCH: 29.2 pg (ref 26.0–34.0)
MCHC: 32.4 g/dL (ref 30.0–36.0)
MCV: 90.1 fL (ref 80.0–100.0)
Monocytes Absolute: 0.6 10*3/uL (ref 0.1–1.0)
Monocytes Relative: 10 %
Neutro Abs: 4.1 10*3/uL (ref 1.7–7.7)
Neutrophils Relative %: 71 %
Platelet Count: 206 10*3/uL (ref 150–400)
RBC: 3.53 MIL/uL — ABNORMAL LOW (ref 4.22–5.81)
RDW: 13.2 % (ref 11.5–15.5)
WBC Count: 5.8 10*3/uL (ref 4.0–10.5)
nRBC: 0 % (ref 0.0–0.2)

## 2023-07-05 LAB — CMP (CANCER CENTER ONLY)
ALT: 8 U/L (ref 0–44)
AST: 15 U/L (ref 15–41)
Albumin: 3.8 g/dL (ref 3.5–5.0)
Alkaline Phosphatase: 87 U/L (ref 38–126)
Anion gap: 6 (ref 5–15)
BUN: 27 mg/dL — ABNORMAL HIGH (ref 8–23)
CO2: 25 mmol/L (ref 22–32)
Calcium: 8.7 mg/dL — ABNORMAL LOW (ref 8.9–10.3)
Chloride: 106 mmol/L (ref 98–111)
Creatinine: 0.82 mg/dL (ref 0.61–1.24)
GFR, Estimated: >60 mL/min (ref >60)
Glucose, Bld: 102 mg/dL — ABNORMAL HIGH (ref 70–99)
Potassium: 4.6 mmol/L (ref 3.5–5.1)
Sodium: 137 mmol/L (ref 135–145)
Total Bilirubin: 0.3 mg/dL (ref 0.3–1.2)
Total Protein: 7.3 g/dL (ref 6.5–8.1)

## 2023-07-05 LAB — TSH: TSH: 0.898 u[IU]/mL (ref 0.350–4.500)

## 2023-07-05 MED ORDER — SODIUM CHLORIDE 0.9 % IV SOLN
480.0000 mg | Freq: Once | INTRAVENOUS | Status: AC
  Administered 2023-07-05: 480 mg via INTRAVENOUS
  Filled 2023-07-05: qty 48

## 2023-07-05 MED ORDER — SODIUM CHLORIDE 0.9 % IV SOLN: Freq: Once | INTRAVENOUS | Status: AC

## 2023-07-05 NOTE — Patient Instructions (Addendum)
 [facility]  Discharge Instructions: Thank you for choosing [facility] to provide your oncology and hematology care.   If you have a lab appointment with the [facility], please go directly to the [facility] and check in at the registration area.   Wear comfortable clothing and clothing appropriate for easy access to any Portacath or PICC line.   We strive to give you quality time with your provider. You may need to reschedule your appointment if you arrive late (15 or more minutes).  Arriving late affects you and other patients whose appointments are after yours.  Also, if you miss three or more appointments without notifying the office, you may be dismissed from the clinic at the provider's discretion.      For prescription refill requests, have your pharmacy contact our office and allow 72 hours for refills to be completed.    Today you received the following chemotherapy and/or immunotherapy agents: Opdivo      To help prevent nausea and vomiting after your treatment, we encourage you to take your nausea medication as directed.  BELOW ARE SYMPTOMS THAT SHOULD BE REPORTED IMMEDIATELY: *FEVER GREATER THAN 100.4 F (38 C) OR HIGHER *CHILLS OR SWEATING *NAUSEA AND VOMITING THAT IS NOT CONTROLLED WITH YOUR NAUSEA MEDICATION *UNUSUAL SHORTNESS OF BREATH *UNUSUAL BRUISING OR BLEEDING *URINARY PROBLEMS (pain or burning when urinating, or frequent urination) *BOWEL PROBLEMS (unusual diarrhea, constipation, pain near the anus) TENDERNESS IN MOUTH AND THROAT WITH OR WITHOUT PRESENCE OF ULCERS (sore throat, sores in mouth, or a toothache) UNUSUAL RASH, SWELLING OR PAIN  UNUSUAL VAGINAL DISCHARGE OR ITCHING   Items with * indicate a potential emergency and should be followed up as soon as possible or go to the Emergency Department if any problems should occur.  Please show the CHEMOTHERAPY ALERT CARD or IMMUNOTHERAPY ALERT CARD at check-in  to the Emergency Department and triage nurse.  Should you have questions after your visit or need to cancel or reschedule your appointment, please contact [facility]  Dept: [phone]  and follow the prompts.  Office hours are 8:00 a.m. to 4:30 p.m. Monday - Friday. Please note that voicemails left after 4:00 p.m. may not be returned until the following business day.  We are closed weekends and major holidays. You have access to a nurse at all times for urgent questions. Please call the main number to the clinic Dept: [phone] and follow the prompts.   For any non-urgent questions, you may also contact your provider using MyChart. We now offer e-Visits for anyone 45 and older to request care online for non-urgent symptoms. For details visit [email or web address].   Also download the MyChart app! Go to the app store, search "MyChart", open the app, select [facility], and log in with your MyChart username and password.

## 2023-07-06 ENCOUNTER — Other Ambulatory Visit: Payer: Self-pay

## 2023-07-07 LAB — T4: T4, Total: 4.8 ug/dL (ref 4.5–12.0)

## 2023-07-23 ENCOUNTER — Other Ambulatory Visit: Payer: Self-pay

## 2023-07-24 ENCOUNTER — Other Ambulatory Visit: Payer: Self-pay

## 2023-07-26 ENCOUNTER — Ambulatory Visit: Payer: No Typology Code available for payment source

## 2023-07-26 ENCOUNTER — Ambulatory Visit: Payer: No Typology Code available for payment source | Admitting: Physician Assistant

## 2023-07-26 ENCOUNTER — Other Ambulatory Visit: Payer: No Typology Code available for payment source

## 2023-08-02 ENCOUNTER — Other Ambulatory Visit: Payer: Self-pay

## 2023-08-02 ENCOUNTER — Inpatient Hospital Stay: Payer: Medicare PPO

## 2023-08-02 ENCOUNTER — Inpatient Hospital Stay: Payer: Medicare PPO | Attending: Oncology

## 2023-08-02 ENCOUNTER — Inpatient Hospital Stay: Payer: Medicare PPO | Admitting: Internal Medicine

## 2023-08-02 VITALS — BP 111/55 | HR 67 | Temp 97.7°F | Resp 16 | Ht 61.0 in | Wt 123.5 lb

## 2023-08-02 DIAGNOSIS — C642 Malignant neoplasm of left kidney, except renal pelvis: Secondary | ICD-10-CM

## 2023-08-02 DIAGNOSIS — C3490 Malignant neoplasm of unspecified part of unspecified bronchus or lung: Secondary | ICD-10-CM | POA: Diagnosis not present

## 2023-08-02 DIAGNOSIS — N400 Enlarged prostate without lower urinary tract symptoms: Secondary | ICD-10-CM | POA: Insufficient documentation

## 2023-08-02 DIAGNOSIS — Z79899 Other long term (current) drug therapy: Secondary | ICD-10-CM | POA: Insufficient documentation

## 2023-08-02 DIAGNOSIS — G4730 Sleep apnea, unspecified: Secondary | ICD-10-CM | POA: Diagnosis not present

## 2023-08-02 DIAGNOSIS — I10 Essential (primary) hypertension: Secondary | ICD-10-CM | POA: Insufficient documentation

## 2023-08-02 DIAGNOSIS — E119 Type 2 diabetes mellitus without complications: Secondary | ICD-10-CM | POA: Insufficient documentation

## 2023-08-02 DIAGNOSIS — Z5112 Encounter for antineoplastic immunotherapy: Secondary | ICD-10-CM | POA: Diagnosis not present

## 2023-08-02 DIAGNOSIS — C649 Malignant neoplasm of unspecified kidney, except renal pelvis: Secondary | ICD-10-CM | POA: Insufficient documentation

## 2023-08-02 LAB — CBC WITH DIFFERENTIAL (CANCER CENTER ONLY)
Abs Immature Granulocytes: 0.02 10*3/uL (ref 0.00–0.07)
Basophils Absolute: 0 10*3/uL (ref 0.0–0.1)
Basophils Relative: 0 %
Eosinophils Absolute: 0.3 10*3/uL (ref 0.0–0.5)
Eosinophils Relative: 5 %
HCT: 32 % — ABNORMAL LOW (ref 39.0–52.0)
Hemoglobin: 10.4 g/dL — ABNORMAL LOW (ref 13.0–17.0)
Immature Granulocytes: 0 %
Lymphocytes Relative: 15 %
Lymphs Abs: 0.8 10*3/uL (ref 0.7–4.0)
MCH: 29.1 pg (ref 26.0–34.0)
MCHC: 32.5 g/dL (ref 30.0–36.0)
MCV: 89.4 fL (ref 80.0–100.0)
Monocytes Absolute: 0.6 10*3/uL (ref 0.1–1.0)
Monocytes Relative: 10 %
Neutro Abs: 4 10*3/uL (ref 1.7–7.7)
Neutrophils Relative %: 70 %
Platelet Count: 233 10*3/uL (ref 150–400)
RBC: 3.58 MIL/uL — ABNORMAL LOW (ref 4.22–5.81)
RDW: 12.8 % (ref 11.5–15.5)
WBC Count: 5.7 10*3/uL (ref 4.0–10.5)
nRBC: 0 % (ref 0.0–0.2)

## 2023-08-02 LAB — CMP (CANCER CENTER ONLY)
ALT: 9 U/L (ref 0–44)
AST: 14 U/L — ABNORMAL LOW (ref 15–41)
Albumin: 3.8 g/dL (ref 3.5–5.0)
Alkaline Phosphatase: 86 U/L (ref 38–126)
Anion gap: 5 (ref 5–15)
BUN: 24 mg/dL — ABNORMAL HIGH (ref 8–23)
CO2: 26 mmol/L (ref 22–32)
Calcium: 8.7 mg/dL — ABNORMAL LOW (ref 8.9–10.3)
Chloride: 106 mmol/L (ref 98–111)
Creatinine: 0.78 mg/dL (ref 0.61–1.24)
GFR, Estimated: >60 mL/min (ref >60)
Glucose, Bld: 114 mg/dL — ABNORMAL HIGH (ref 70–99)
Potassium: 4.7 mmol/L (ref 3.5–5.1)
Sodium: 137 mmol/L (ref 135–145)
Total Bilirubin: 0.4 mg/dL (ref 0.3–1.2)
Total Protein: 7.4 g/dL (ref 6.5–8.1)

## 2023-08-02 LAB — TSH: TSH: 0.886 u[IU]/mL (ref 0.350–4.500)

## 2023-08-02 MED ORDER — SODIUM CHLORIDE 0.9 % IV SOLN
480.0000 mg | Freq: Once | INTRAVENOUS | Status: AC
  Administered 2023-08-02: 480 mg via INTRAVENOUS
  Filled 2023-08-02: qty 48

## 2023-08-02 MED ORDER — SODIUM CHLORIDE 0.9 % IV SOLN: Freq: Once | INTRAVENOUS | Status: AC

## 2023-08-02 NOTE — Progress Notes (Signed)
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  PRIOR THERAPY:  1) Status post kidney biopsy on August 30, 2022 consistent with renal cell carcinoma. 2) Induction treatment with immunotherapy with ipilimumab 1 Mg/KG and nivolumab 3 mg/KG every 3 weeks started on September 21, 2022 status post 4 cycles with partial response.  CURRENT THERAPY: Maintenance treatment with nivolumab 480 Mg IV every 4 weeks first dose started December 14, 2022.  Status post 8 cycle of the maintenance therapy.  INTERVAL HISTORY: Anthony Sheppard 87 y.o. male returns to the clinic today for follow-up visit accompanied by his god daughter.  The patient is feeling fine today with no concerning complaints.  He denied having any current chest pain, shortness of breath, cough or hemoptysis.  He has no nausea, vomiting, diarrhea or constipation.  He has no headache or visual changes.  He has no recent fever or chills.  He is here today for evaluation before starting cycle #9 of his treatment.  MEDICAL HISTORY: Past Medical History:  Diagnosis Date   Diabetes mellitus    Enlarged prostate    Hypertension    Sleep apnea     ALLERGIES:  is allergic to aspirin.  MEDICATIONS:  Current Outpatient Medications  Medication Sig Dispense Refill   ADVIL 200 MG CAPS Take 400 mg by mouth every 6 (six) hours as needed (for mild pain or headaches).     ALEVE 220 MG tablet Take 220-440 mg by mouth 2 (two) times daily as needed (for mild pain or headaches).     amLODipine (NORVASC) 5 MG tablet Take 5 mg by mouth daily.     cetirizine (ZYRTEC) 10 MG tablet Take 10 mg by mouth at bedtime.     finasteride (PROSCAR) 5 MG tablet Take 5 mg by mouth at bedtime.     tamsulosin (FLOMAX) 0.4 MG  CAPS capsule Take 0.4 mg by mouth at bedtime.      No current facility-administered medications for this visit.    SURGICAL HISTORY: No past surgical history on file.  REVIEW OF SYSTEMS:  A comprehensive review of systems was negative.   PHYSICAL EXAMINATION: General appearance: alert, cooperative, and no distress Head: Normocephalic, without obvious abnormality, atraumatic Neck: no adenopathy, no JVD, supple, symmetrical, trachea midline, and thyroid not enlarged, symmetric, no tenderness/mass/nodules Lymph nodes: Cervical, supraclavicular, and axillary nodes normal. Resp: clear to auscultation bilaterally Back: symmetric, no curvature. ROM normal. No CVA tenderness. Cardio: regular rate and rhythm, S1, S2 normal, no murmur, click, rub or gallop GI: soft, non-tender; bowel sounds normal; no masses,  no organomegaly Extremities: extremities normal, atraumatic, no cyanosis or edema  ECOG PERFORMANCE STATUS: 1 - Symptomatic but completely ambulatory  Blood pressure (!) 111/55, pulse 67, temperature 97.7 F (36.5 C), temperature source Oral, resp. rate 16, height 5\' 1"  (1.549 m), weight 123 lb 8 oz (56 kg), SpO2 98%.  LABORATORY DATA: Lab Results  Component Value Date   WBC 5.7 08/02/2023   HGB 10.4 (L) 08/02/2023   HCT 32.0 (L) 08/02/2023   MCV 89.4 08/02/2023   PLT 233 08/02/2023      Chemistry      Component Value Date/Time   NA 137 07/05/2023 1332   K 4.6 07/05/2023 1332   CL 106 07/05/2023 1332  CO2 25 07/05/2023 1332   BUN 27 (H) 07/05/2023 1332   CREATININE 0.82 07/05/2023 1332      Component Value Date/Time   CALCIUM 8.7 (L) 07/05/2023 1332   ALKPHOS 87 07/05/2023 1332   AST 15 07/05/2023 1332   ALT 8 07/05/2023 1332   BILITOT 0.3 07/05/2023 1332       RADIOGRAPHIC STUDIES: No results found.  ASSESSMENT AND PLAN: This is a very pleasant 87 years old African-American male with a stage IV intermediate risk clear-cell carcinoma diagnosed in September 2023  status post 4 cycles of induction treatment with immunotherapy with ipilimumab and nivolumab and starting from cycle #5 he is currently on maintenance treatment with nivolumab 480 mg IV every 4 weeks status post total of 12 cycles.  The patient has been tolerating this treatment well with no concerning adverse effects. I recommended for him to proceed with cycle #13 today as planned. I will see him back for follow-up visit in 4 weeks for evaluation with repeat CT scan of the chest, abdomen and pelvis for restaging of his disease. The patient was advised to call immediately if he has any other concerning symptoms in the interval. The patient voices understanding of current disease status and treatment options and is in agreement with the current care plan.  All questions were answered. The patient knows to call the clinic with any problems, questions or concerns. We can certainly see the patient much sooner if necessary.  The total time spent in the appointment was 20 minutes.  Disclaimer: This note was dictated with voice recognition software. Similar sounding words can inadvertently be transcribed and may not be corrected upon review.

## 2023-08-02 NOTE — Patient Instructions (Signed)
[facility]  Discharge Instructions: Thank you for choosing [facility] to provide your oncology and hematology care.   If you have a lab appointment with the [facility], please go directly to the [facility] and check in at the registration area.   Wear comfortable clothing and clothing appropriate for easy access to any Portacath or PICC line.   We strive to give you quality time with your provider. You may need to reschedule your appointment if you arrive late (15 or more minutes).  Arriving late affects you and other patients whose appointments are after yours.  Also, if you miss three or more appointments without notifying the office, you may be dismissed from the clinic at the provider's discretion.      For prescription refill requests, have your pharmacy contact our office and allow 72 hours for refills to be completed.    Today you received the following chemotherapy and/or immunotherapy agents: Opdivo      To help prevent nausea and vomiting after your treatment, we encourage you to take your nausea medication as directed.  BELOW ARE SYMPTOMS THAT SHOULD BE REPORTED IMMEDIATELY: *FEVER GREATER THAN 100.4 F (38 C) OR HIGHER *CHILLS OR SWEATING *NAUSEA AND VOMITING THAT IS NOT CONTROLLED WITH YOUR NAUSEA MEDICATION *UNUSUAL SHORTNESS OF BREATH *UNUSUAL BRUISING OR BLEEDING *URINARY PROBLEMS (pain or burning when urinating, or frequent urination) *BOWEL PROBLEMS (unusual diarrhea, constipation, pain near the anus) TENDERNESS IN MOUTH AND THROAT WITH OR WITHOUT PRESENCE OF ULCERS (sore throat, sores in mouth, or a toothache) UNUSUAL RASH, SWELLING OR PAIN  UNUSUAL VAGINAL DISCHARGE OR ITCHING   Items with * indicate a potential emergency and should be followed up as soon as possible or go to the Emergency Department if any problems should occur.  Please show the CHEMOTHERAPY ALERT CARD or IMMUNOTHERAPY ALERT CARD at check-in  to the Emergency Department and triage nurse.  Should you have questions after your visit or need to cancel or reschedule your appointment, please contact [facility]  Dept: [phone]  and follow the prompts.  Office hours are 8:00 a.m. to 4:30 p.m. Monday - Friday. Please note that voicemails left after 4:00 p.m. may not be returned until the following business day.  We are closed weekends and major holidays. You have access to a nurse at all times for urgent questions. Please call the main number to the clinic Dept: [phone] and follow the prompts.   For any non-urgent questions, you may also contact your provider using MyChart. We now offer e-Visits for anyone 45 and older to request care online for non-urgent symptoms. For details visit [email or web address].   Also download the MyChart app! Go to the app store, search "MyChart", open the app, select [facility], and log in with your MyChart username and password.

## 2023-08-04 ENCOUNTER — Other Ambulatory Visit: Payer: Self-pay

## 2023-08-06 ENCOUNTER — Other Ambulatory Visit: Payer: Self-pay

## 2023-08-23 ENCOUNTER — Ambulatory Visit: Payer: No Typology Code available for payment source

## 2023-08-23 ENCOUNTER — Other Ambulatory Visit: Payer: No Typology Code available for payment source

## 2023-08-23 ENCOUNTER — Ambulatory Visit: Payer: No Typology Code available for payment source | Admitting: Physician Assistant

## 2023-08-30 ENCOUNTER — Ambulatory Visit (HOSPITAL_COMMUNITY)
Admission: RE | Admit: 2023-08-30 | Discharge: 2023-08-30 | Disposition: A | Discharge status: Home / Self Care | Payer: Medicare PPO | Source: Ambulatory Visit | Attending: Internal Medicine | Admitting: Internal Medicine

## 2023-08-30 ENCOUNTER — Other Ambulatory Visit: Payer: Self-pay | Admitting: Internal Medicine

## 2023-08-30 DIAGNOSIS — C3490 Malignant neoplasm of unspecified part of unspecified bronchus or lung: Secondary | ICD-10-CM

## 2023-08-30 DIAGNOSIS — C642 Malignant neoplasm of left kidney, except renal pelvis: Secondary | ICD-10-CM

## 2023-08-31 ENCOUNTER — Ambulatory Visit: Payer: No Typology Code available for payment source

## 2023-08-31 ENCOUNTER — Ambulatory Visit: Payer: No Typology Code available for payment source | Admitting: Physician Assistant

## 2023-08-31 ENCOUNTER — Other Ambulatory Visit: Payer: No Typology Code available for payment source

## 2023-08-31 NOTE — Progress Notes (Signed)
Anthony Sheppard OFFICE PROGRESS NOTE  System, Provider Not In No address on file  DIAGNOSIS: Stage IV intermediate risk clear-cell renal cell carcinoma diagnosed in September 2023 with abdominal adenopathy and pulmonary nodules.   PRIOR THERAPY:  1) Status post kidney biopsy on August 30, 2022 consistent with renal cell carcinoma. 2) Induction treatment with immunotherapy with ipilimumab 1 Mg/KG and nivolumab 3 mg/KG every 3 weeks started on September 21, 2022 status post 4 cycles with partial response.  CURRENT THERAPY: Maintenance treatment with nivolumab 480 Mg IV every 4 weeks first dose started December 14, 2022. Status post 13 cycle of the maintenance therapy.   INTERVAL HISTORY: Anthony Sheppard 87 y.o. male returns to the clinic today for a follow-up visit accompanied by his goddaughter.  The patient is feeling well today without any concerning complaints. He is currently undergoing immunotherapy with nivolumab IV every 4 weeks and is tolerating it well without any concerning adverse side effects.  He denies any recent fever, chills, or night sweats. He gained a few pounds lbs since last being seen. He states his appetite is good. He has seen a member the nutritionist team in the past.   Denies any nausea, vomiting, diarrhea, or constipation. Denies any chest pain, shortness of breath, cough, or hemoptysis.  Denies any rashes or skin changes except he sometimes has itching, primarily in the bottom of his feet.  He takes Zyrtec daily. Denies any unusual abdominal pain or back pain.  Denies any hematuria, malodorous urine, or cloudy urine.  He recently had a restaging CT scan performed.  He is here today for evaluation and to review his scan results before undergoing cycle #14.      MEDICAL HISTORY: Past Medical History:  Diagnosis Date   Diabetes mellitus    Enlarged prostate    Hypertension    Sleep apnea     ALLERGIES:  is allergic to aspirin.  MEDICATIONS:  Current  Outpatient Medications  Medication Sig Dispense Refill   amLODipine (NORVASC) 5 MG tablet Take 5 mg by mouth daily.     cetirizine (ZYRTEC) 10 MG tablet Take 10 mg by mouth at bedtime.     finasteride (PROSCAR) 5 MG tablet Take 5 mg by mouth at bedtime.     tamsulosin (FLOMAX) 0.4 MG CAPS capsule Take 0.4 mg by mouth at bedtime.      ADVIL 200 MG CAPS Take 400 mg by mouth every 6 (six) hours as needed (for mild pain or headaches). (Patient not taking: Reported on 09/05/2023)     ALEVE 220 MG tablet Take 220-440 mg by mouth 2 (two) times daily as needed (for mild pain or headaches). (Patient not taking: Reported on 09/05/2023)     No current facility-administered medications for this visit.    SURGICAL HISTORY: No past surgical history on file.  REVIEW OF SYSTEMS:   Review of Systems  Constitutional: Negative for appetite change, chills, fatigue, fever and unexpected weight change.  HENT: Negative for mouth sores, nosebleeds, sore throat and trouble swallowing.   Eyes: Negative for eye problems and icterus.  Respiratory: Negative for cough, hemoptysis, shortness of breath and wheezing.   Cardiovascular: Negative for chest pain and leg swelling.  Gastrointestinal: Negative for abdominal pain, constipation, diarrhea, nausea and vomiting.  Genitourinary: Negative for bladder incontinence, difficulty urinating, dysuria, frequency and hematuria.   Musculoskeletal: Negative for back pain, gait problem, neck pain and neck stiffness.  Skin: Positive for itching the bottom of his feet.  Neurological: Negative  for dizziness, extremity weakness, gait problem, headaches, light-headedness and seizures.  Hematological: Negative for adenopathy. Does not bruise/bleed easily.  Psychiatric/Behavioral: Negative for confusion, depression and sleep disturbance. The patient is not nervous/anxious.     PHYSICAL EXAMINATION:  Blood pressure 135/65, pulse (!) 56, temperature 97.8 F (36.6 C), temperature source  Temporal, resp. rate 18, height 5\' 1"  (1.549 m), weight 127 lb (57.6 kg), SpO2 98%.  ECOG PERFORMANCE STATUS: 1  Physical Exam  Constitutional: Oriented to person, place, and time and elderly appearing male and in no distress.  HENT:  Head: Normocephalic and atraumatic.  Mouth/Throat: Oropharynx is clear and moist. No oropharyngeal exudate.  Eyes: Conjunctivae are normal. Right eye exhibits no discharge. Left eye exhibits no discharge. No scleral icterus.  Neck: Normal range of motion. Neck supple.  Cardiovascular: Normal rate, regular rhythm, normal heart sounds and intact distal pulses.   Pulmonary/Chest: Effort normal and breath sounds normal. No respiratory distress. No wheezes. No rales.  Abdominal: Soft. Bowel sounds are normal. Exhibits no distension and no mass. There is no tenderness.  Musculoskeletal: Normal range of motion. Exhibits no edema.  Lymphadenopathy:    No cervical adenopathy.  Neurological: Alert and oriented to person, place, and time. Exhibits normal muscle tone. Gait normal. Coordination normal.  Skin: Skin is warm and dry. No rash noted. Not diaphoretic. No erythema. No pallor.  Psychiatric: Mood, memory and judgment normal.  Vitals reviewed.  LABORATORY DATA: Lab Results  Component Value Date   WBC 4.6 09/05/2023   HGB 10.7 (L) 09/05/2023   HCT 34.6 (L) 09/05/2023   MCV 91.5 09/05/2023   PLT 222 09/05/2023      Chemistry      Component Value Date/Time   NA 137 09/05/2023 1023   K 4.1 09/05/2023 1023   CL 107 09/05/2023 1023   CO2 25 09/05/2023 1023   BUN 21 09/05/2023 1023   CREATININE 0.85 09/05/2023 1023      Component Value Date/Time   CALCIUM 8.7 (L) 09/05/2023 1023   ALKPHOS 85 09/05/2023 1023   AST 13 (L) 09/05/2023 1023   ALT 9 09/05/2023 1023   BILITOT 0.3 09/05/2023 1023       RADIOGRAPHIC STUDIES:  CT CHEST ABDOMEN PELVIS WO CONTRAST  Result Date: 09/02/2023 CLINICAL DATA:  Non-small-cell lung cancer. Restaging. * Tracking  Code: BO * EXAM: CT CHEST, ABDOMEN AND PELVIS WITHOUT CONTRAST TECHNIQUE: Multidetector CT imaging of the chest, abdomen and pelvis was performed following the standard protocol without IV contrast. RADIATION DOSE REDUCTION: This exam was performed according to the departmental dose-optimization program which includes automated exposure control, adjustment of the mA and/or kV according to patient size and/or use of iterative reconstruction technique. COMPARISON:  06/14/2023 FINDINGS: CT CHEST FINDINGS Cardiovascular: The heart size is normal. No substantial pericardial effusion. Coronary artery calcification is evident. Mild atherosclerotic calcification is noted in the wall of the thoracic aorta. Mediastinum/Nodes: No mediastinal lymphadenopathy. Enlarged multinodular thyroid gland again noted with 2.1 cm left thyroid nodule visible on image 7/2. No evidence for gross hilar lymphadenopathy although assessment is limited by the lack of intravenous contrast on the current study. The esophagus has normal imaging features. There is no axillary lymphadenopathy. Lungs/Pleura: Cylindrical bronchiectasis noted in the lower lungs bilaterally. Stable 3 mm left upper lobe pulmonary nodule on 37/4. 4 mm perifissural nodule left lung on 79/4 is not substantially changed in the interval. No new suspicious pulmonary nodule or mass. No focal airspace consolidation. No pleural effusion. Musculoskeletal: Stable lucency in  the T12 vertebral body with compression deformity on sagittal imaging. Otherwise no suspicious lytic or sclerotic osseous abnormality. CT ABDOMEN PELVIS FINDINGS Hepatobiliary: Similar scattered small hypodensities within the liver parenchyma. Dominant posterior right hepatic lesion measuring 2.5 cm is likely a cyst given lack of hypermetabolism on previous PET imaging. Additional tiny hypodensities in the liver are too small to characterize but also statistically benign. Gallbladder is decompressed. No  intrahepatic or extrahepatic biliary dilation. Pancreas: No focal mass lesion. No dilatation of the main duct. No intraparenchymal cyst. No peripancreatic edema. Spleen: No splenomegaly. No suspicious focal mass lesion. Adrenals/Urinary Tract: No adrenal nodule or mass. Stable small cyst lower pole right kidney. Left kidney unremarkable. No evidence for hydroureter. The urinary bladder appears normal for the degree of distention. Stomach/Bowel: Stomach is unremarkable. No gastric wall thickening. No evidence of outlet obstruction. Duodenum is normally positioned as is the ligament of Treitz. Duodenal diverticulum noted. No small bowel wall thickening. No small bowel dilatation. Moderate to large stool volume. Vascular/Lymphatic: No abdominal aortic aneurysm. No abdominal lymphadenopathy No pelvic sidewall lymphadenopathy. Reproductive: Prostate gland is markedly enlarged. Other: No intraperitoneal free fluid. Musculoskeletal: Right groin hernia again identified containing either small bowel or a portion of the antro lateral right bladder wall. There is no edema or fluid in the hernia sac. Assessment is limited by lack of intravenous contrast material. Small fat containing umbilical hernia evident. No worrisome lytic or sclerotic osseous abnormality. Trace anterolisthesis of L4 on 5 and L5 on S1 is stable in the interval, likely related to the associated facet osteoarthritis. IMPRESSION: 1. Stable exam. No new or progressive findings to suggest recurrent or metastatic disease in the chest, abdomen, or pelvis. 2. Central lucency with compression deformity in the T12 vertebral body is stable since prior. 3. Stable right groin hernia containing either small bowel or a portion of the antero lateral right bladder wall. No edema or fluid in the hernia sac. No definite complicating features although assessment is limited by lack of intravenous contrast material. 4. Enlarged multinodular thyroid gland with 2.1 cm left  thyroid nodule. Recommend thyroid US (ref: J Am Coll Radiol. 2015 Feb;12(2): 143-50). 5. Tiny lung nodules identified previously are stable in the interval. 6. Prostatomegaly. 7.  Aortic Atherosclerosis (ICD10-I70.0). Electronically Signed   By: Kennith Sheppard M.D.   On: 09/02/2023 09:20     ASSESSMENT/PLAN:  This is a very pleasant 87 year old African-American male with a stage IV intermediate risk clear-cell carcinoma diagnosed in September 2023 status post 4 cycles of induction treatment with immunotherapy with ipilimumab and nivolumab and he is currently on maintenance treatment with nivolumab 480 mg IV every 4 weeks status post total of 13 cycles.  The patient had good response after cycle #4. He is currently tolerating his treatment with the immunotherapy fairly well with no concerning adverse effect except for itching   The patient was seen with Anthony Sheppard today.  Anthony Sheppard personally and independently reviewed the scan and discussed results with the patient today.  The scan showed no evidence of disease progression.  Anthony Sheppard recommends he continue on the same treatment at the same dose.   I recommended for him to proceed with cycle #14 today as planned.   I will see him back for follow-up visit in 4 weeks for evaluation before starting cycle #15  The patient was advised to call immediately if she has any concerning symptoms in the interval. The patient voices understanding of current disease status and treatment  options and is in agreement with the current care plan. All questions were answered. The patient knows to call the clinic with any problems, questions or concerns. We can certainly see the patient much sooner if necessary  No orders of the defined types were placed in this encounter.     Anthony Scalia L Naphtali Riede, PA-C 09/05/23  ADDENDUM: Hematology/Oncology Attending: Had a face-to-face encounter with the patient today.  I reviewed his record, lab, scan and  recommended his care plan.  This is a very pleasant 87 years old African-American male with stage IV intermediate-risk left clear-cell renal cell carcinoma diagnosed in September 2023 with abdominal adenopathy as well as pulmonary nodules.  The patient is status post biopsy of the left kidney followed by induction treatment with immunotherapy with ipilimumab and nivolumab for 4 cycles with partial response.  He is currently undergoing maintenance treatment with single agent nivolumab 480 Mg IV every 4 weeks status post 13 cycles of the maintenance therapy. The patient has been tolerating this treatment well with no concerning adverse effects. He had repeat CT scan of the chest, abdomen and pelvis performed recently.  I personally and independently reviewed the scan and discussed the result with the patient and his god daughter. His scan showed no concerning findings for disease progression. I recommended for the patient to continue his current treatment with maintenance nivolumab and he will proceed with cycle #14 today. He will come back for follow-up visit in 4 weeks for evaluation before the next cycle of his treatment. The patient was advised to call immediately if he has any concerning symptoms in the interval. The total time spent in the appointment was 30 minutes. Disclaimer: This note was dictated with voice recognition software. Similar sounding words can inadvertently be transcribed and may be missed upon review. Anthony Matte, MD

## 2023-09-05 ENCOUNTER — Inpatient Hospital Stay (HOSPITAL_BASED_OUTPATIENT_CLINIC_OR_DEPARTMENT_OTHER): Payer: Medicare PPO | Admitting: Physician Assistant

## 2023-09-05 ENCOUNTER — Inpatient Hospital Stay: Payer: Medicare PPO

## 2023-09-05 ENCOUNTER — Inpatient Hospital Stay: Payer: Medicare PPO | Attending: Oncology

## 2023-09-05 VITALS — BP 120/61 | HR 59 | Resp 16

## 2023-09-05 VITALS — BP 135/65 | HR 56 | Temp 97.8°F | Resp 18 | Ht 61.0 in | Wt 127.0 lb

## 2023-09-05 DIAGNOSIS — C642 Malignant neoplasm of left kidney, except renal pelvis: Secondary | ICD-10-CM

## 2023-09-05 DIAGNOSIS — Z5112 Encounter for antineoplastic immunotherapy: Secondary | ICD-10-CM | POA: Insufficient documentation

## 2023-09-05 DIAGNOSIS — C649 Malignant neoplasm of unspecified kidney, except renal pelvis: Secondary | ICD-10-CM | POA: Insufficient documentation

## 2023-09-05 DIAGNOSIS — Z79899 Other long term (current) drug therapy: Secondary | ICD-10-CM | POA: Insufficient documentation

## 2023-09-05 DIAGNOSIS — Z9221 Personal history of antineoplastic chemotherapy: Secondary | ICD-10-CM | POA: Insufficient documentation

## 2023-09-05 LAB — CMP (CANCER CENTER ONLY)
ALT: 9 U/L (ref 0–44)
AST: 13 U/L — ABNORMAL LOW (ref 15–41)
Albumin: 3.9 g/dL (ref 3.5–5.0)
Alkaline Phosphatase: 85 U/L (ref 38–126)
Anion gap: 5 (ref 5–15)
BUN: 21 mg/dL (ref 8–23)
CO2: 25 mmol/L (ref 22–32)
Calcium: 8.7 mg/dL — ABNORMAL LOW (ref 8.9–10.3)
Chloride: 107 mmol/L (ref 98–111)
Creatinine: 0.85 mg/dL (ref 0.61–1.24)
GFR, Estimated: >60 mL/min (ref >60)
Glucose, Bld: 117 mg/dL — ABNORMAL HIGH (ref 70–99)
Potassium: 4.1 mmol/L (ref 3.5–5.1)
Sodium: 137 mmol/L (ref 135–145)
Total Bilirubin: 0.3 mg/dL (ref 0.3–1.2)
Total Protein: 7.4 g/dL (ref 6.5–8.1)

## 2023-09-05 LAB — CBC WITH DIFFERENTIAL (CANCER CENTER ONLY)
Abs Immature Granulocytes: 0.01 10*3/uL (ref 0.00–0.07)
Basophils Absolute: 0 10*3/uL (ref 0.0–0.1)
Basophils Relative: 1 %
Eosinophils Absolute: 0.2 10*3/uL (ref 0.0–0.5)
Eosinophils Relative: 5 %
HCT: 34.6 % — ABNORMAL LOW (ref 39.0–52.0)
Hemoglobin: 10.7 g/dL — ABNORMAL LOW (ref 13.0–17.0)
Immature Granulocytes: 0 %
Lymphocytes Relative: 16 %
Lymphs Abs: 0.7 10*3/uL (ref 0.7–4.0)
MCH: 28.3 pg (ref 26.0–34.0)
MCHC: 30.9 g/dL (ref 30.0–36.0)
MCV: 91.5 fL (ref 80.0–100.0)
Monocytes Absolute: 0.5 10*3/uL (ref 0.1–1.0)
Monocytes Relative: 10 %
Neutro Abs: 3.1 10*3/uL (ref 1.7–7.7)
Neutrophils Relative %: 68 %
Platelet Count: 222 10*3/uL (ref 150–400)
RBC: 3.78 MIL/uL — ABNORMAL LOW (ref 4.22–5.81)
RDW: 12.8 % (ref 11.5–15.5)
WBC Count: 4.6 10*3/uL (ref 4.0–10.5)
nRBC: 0 % (ref 0.0–0.2)

## 2023-09-05 LAB — TSH: TSH: 0.868 u[IU]/mL (ref 0.350–4.500)

## 2023-09-05 MED ORDER — SODIUM CHLORIDE 0.9 % IV SOLN
480.0000 mg | Freq: Once | INTRAVENOUS | Status: AC
  Administered 2023-09-05: 480 mg via INTRAVENOUS
  Filled 2023-09-05: qty 48

## 2023-09-05 MED ORDER — SODIUM CHLORIDE 0.9 % IV SOLN: Freq: Once | INTRAVENOUS | Status: AC

## 2023-09-05 NOTE — Patient Instructions (Signed)
[place] [facility]  Discharge Instructions: Thank you for choosing [facility] to provide your oncology and hematology care.   If you have a lab appointment with the Cancer Center, please go directly to the Cancer Center and check in at the registration area.   Wear comfortable clothing and clothing appropriate for easy access to any Portacath or PICC line.   We strive to give you quality time with your provider. You may need to reschedule your appointment if you arrive late (15 or more minutes).  Arriving late affects you and other patients whose appointments are after yours.  Also, if you miss three or more appointments without notifying the office, you may be dismissed from the clinic at the provider's discretion.      For prescription refill requests, have your pharmacy contact our office and allow 72 hours for refills to be completed.    Today you received the following chemotherapy and/or immunotherapy agent: Nivolumab (Opdivo)   To help prevent nausea and vomiting after your treatment, we encourage you to take your nausea medication as directed.  BELOW ARE SYMPTOMS THAT SHOULD BE REPORTED IMMEDIATELY: *FEVER GREATER THAN 100.4 F (38 C) OR HIGHER *CHILLS OR SWEATING *NAUSEA AND VOMITING THAT IS NOT CONTROLLED WITH YOUR NAUSEA MEDICATION *UNUSUAL SHORTNESS OF BREATH *UNUSUAL BRUISING OR BLEEDING *URINARY PROBLEMS (pain or burning when urinating, or frequent urination) *BOWEL PROBLEMS (unusual diarrhea, constipation, pain near the anus) TENDERNESS IN MOUTH AND THROAT WITH OR WITHOUT PRESENCE OF ULCERS (sore throat, sores in mouth, or a toothache) UNUSUAL RASH, SWELLING OR PAIN  UNUSUAL VAGINAL DISCHARGE OR ITCHING   Items with * indicate a potential emergency and should be followed up as soon as possible or go to the Emergency Department if any problems should occur.  Please show the CHEMOTHERAPY ALERT CARD or IMMUNOTHERAPY ALERT CARD at  check-in to the Emergency Department and triage nurse.  Should you have questions after your visit or need to cancel or reschedule your appointment, please contact [place] [facility]  Dept: [phone]  and follow the prompts.  Office hours are 8:00 a.m. to 4:30 p.m. Monday - Friday. Please note that voicemails left after 4:00 p.m. may not be returned until the following business day.  We are closed weekends and major holidays. You have access to a nurse at all times for urgent questions. Please call the main number to the clinic Dept: [phone] and follow the prompts.   For any non-urgent questions, you may also contact your provider using MyChart. We now offer e-Visits for anyone 1 and older to request care online for non-urgent symptoms. For details visit [email or web address].   Also download the MyChart app! Go to the app store, search "MyChart", open the app, select [place], and log in with your MyChart username and password.  Nivolumab Injection What is this medication? NIVOLUMAB (nye VOL ue mab) treats some types of cancer. It works by helping your immune system slow or stop the spread of cancer cells. It is a monoclonal antibody. This medicine may be used for other purposes; ask your health care provider or pharmacist if you have questions. COMMON BRAND NAME(S): Opdivo What should I tell my care team before I take this medication? They need to know if you have any of these conditions: Allogeneic stem cell transplant (uses someone else's stem cells) Autoimmune diseases, such as Crohn disease, ulcerative colitis, lupus History of chest radiation Nervous system problems, such  as Guillain-Barre syndrome or myasthenia gravis Organ transplant An unusual or allergic reaction to nivolumab, other medications, foods, dyes, or preservatives Pregnant or trying to get pregnant Breast-feeding How should I use this medication? This medication is infused  into a vein. It is given in a hospital or clinic setting. A special MedGuide will be given to you before each treatment. Be sure to read this information carefully each time. Talk to your care team about the use of this medication in children. While it may be prescribed for children as young as 12 years for selected conditions, precautions do apply. Overdosage: If you think you have taken too much of this medicine contact a poison control center or emergency room at once. NOTE: This medicine is only for you. Do not share this medicine with others. What if I miss a dose? Keep appointments for follow-up doses. It is important not to miss your dose. Call your care team if you are unable to keep an appointment. What may interact with this medication? Interactions have not been studied. This list may not describe all possible interactions. Give your health care provider a list of all the medicines, herbs, non-prescription drugs, or dietary supplements you use. Also tell them if you smoke, drink alcohol, or use illegal drugs. Some items may interact with your medicine. What should I watch for while using this medication? Your condition will be monitored carefully while you are receiving this medication. You may need blood work while taking this medication. This medication may cause serious skin reactions. They can happen weeks to months after starting the medication. Contact your care team right away if you notice fevers or flu-like symptoms with a rash. The rash may be red or purple and then turn into blisters or peeling of the skin. You may also notice a red rash with swelling of the face, lips, or lymph nodes in your neck or under your arms. Tell your care team right away if you have any change in your eyesight. Talk to your care team if you are pregnant or think you might be pregnant. A negative pregnancy test is required before starting this medication. A reliable form of contraception is recommended  while taking this medication and for 5 months after the last dose. Talk to your care team about effective forms of contraception. Do not breast-feed while taking this medication and for 5 months after the last dose. What side effects may I notice from receiving this medication? Side effects that you should report to your care team as soon as possible: Allergic reactions--skin rash, itching, hives, swelling of the face, lips, tongue, or throat Dry cough, shortness of breath or trouble breathing Eye pain, redness, irritation, or discharge with blurry or decreased vision Heart muscle inflammation--unusual weakness or fatigue, shortness of breath, chest pain, fast or irregular heartbeat, dizziness, swelling of the ankles, feet, or hands Hormone gland problems--headache, sensitivity to light, unusual weakness or fatigue, dizziness, fast or irregular heartbeat, increased sensitivity to cold or heat, excessive sweating, constipation, hair loss, increased thirst or amount of urine, tremors or shaking, irritability Infusion reactions--chest pain, shortness of breath or trouble breathing, feeling faint or lightheaded Kidney injury (glomerulonephritis)--decrease in the amount of urine, red or dark brown urine, foamy or bubbly urine, swelling of the ankles, hands, or feet Liver injury--right upper belly pain, loss of appetite, nausea, light-colored stool, dark yellow or brown urine, yellowing skin or eyes, unusual weakness or fatigue Pain, tingling, or numbness in the hands or feet, muscle weakness,  change in vision, confusion or trouble speaking, loss of balance or coordination, trouble walking, seizures Rash, fever, and swollen lymph nodes Redness, blistering, peeling, or loosening of the skin, including inside the mouth Sudden or severe stomach pain, bloody diarrhea, fever, nausea, vomiting Side effects that usually do not require medical attention (report these to your care team if they continue or are  bothersome): Bone, joint, or muscle pain Diarrhea Fatigue Loss of appetite Nausea Skin rash This list may not describe all possible side effects. Call your doctor for medical advice about side effects. You may report side effects to FDA at 1-800-FDA-1088. Where should I keep my medication? This medication is given in a hospital or clinic. It will not be stored at home. NOTE: This sheet is a summary. It may not cover all possible information. If you have questions about this medicine, talk to your doctor, pharmacist, or health care provider.  2024 Elsevier/Gold Standard (2022-04-05 00:00:00)

## 2023-09-06 ENCOUNTER — Other Ambulatory Visit: Payer: Self-pay

## 2023-09-06 LAB — T4: T4, Total: 4.8 ug/dL (ref 4.5–12.0)

## 2023-09-10 ENCOUNTER — Other Ambulatory Visit: Payer: Self-pay

## 2023-09-20 ENCOUNTER — Ambulatory Visit: Payer: No Typology Code available for payment source | Admitting: Internal Medicine

## 2023-09-20 ENCOUNTER — Other Ambulatory Visit: Payer: No Typology Code available for payment source

## 2023-09-20 ENCOUNTER — Ambulatory Visit: Payer: No Typology Code available for payment source

## 2023-09-27 ENCOUNTER — Other Ambulatory Visit: Payer: No Typology Code available for payment source

## 2023-09-27 ENCOUNTER — Ambulatory Visit: Payer: No Typology Code available for payment source

## 2023-09-27 ENCOUNTER — Ambulatory Visit: Payer: No Typology Code available for payment source | Admitting: Internal Medicine

## 2023-10-04 ENCOUNTER — Other Ambulatory Visit: Payer: Self-pay

## 2023-10-04 ENCOUNTER — Inpatient Hospital Stay (HOSPITAL_BASED_OUTPATIENT_CLINIC_OR_DEPARTMENT_OTHER): Payer: Medicare PPO | Admitting: Internal Medicine

## 2023-10-04 ENCOUNTER — Inpatient Hospital Stay: Payer: Medicare PPO | Attending: Oncology

## 2023-10-04 ENCOUNTER — Inpatient Hospital Stay: Payer: Medicare PPO

## 2023-10-04 DIAGNOSIS — I10 Essential (primary) hypertension: Secondary | ICD-10-CM | POA: Insufficient documentation

## 2023-10-04 DIAGNOSIS — C642 Malignant neoplasm of left kidney, except renal pelvis: Secondary | ICD-10-CM | POA: Diagnosis not present

## 2023-10-04 DIAGNOSIS — C649 Malignant neoplasm of unspecified kidney, except renal pelvis: Secondary | ICD-10-CM | POA: Diagnosis not present

## 2023-10-04 DIAGNOSIS — Z79899 Other long term (current) drug therapy: Secondary | ICD-10-CM | POA: Insufficient documentation

## 2023-10-04 DIAGNOSIS — E119 Type 2 diabetes mellitus without complications: Secondary | ICD-10-CM | POA: Insufficient documentation

## 2023-10-04 DIAGNOSIS — G4730 Sleep apnea, unspecified: Secondary | ICD-10-CM | POA: Diagnosis not present

## 2023-10-04 DIAGNOSIS — Z5112 Encounter for antineoplastic immunotherapy: Secondary | ICD-10-CM | POA: Diagnosis not present

## 2023-10-04 DIAGNOSIS — N400 Enlarged prostate without lower urinary tract symptoms: Secondary | ICD-10-CM | POA: Insufficient documentation

## 2023-10-04 LAB — CBC WITH DIFFERENTIAL (CANCER CENTER ONLY)
Abs Immature Granulocytes: 0.02 10*3/uL (ref 0.00–0.07)
Basophils Absolute: 0 10*3/uL (ref 0.0–0.1)
Basophils Relative: 0 %
Eosinophils Absolute: 0.2 10*3/uL (ref 0.0–0.5)
Eosinophils Relative: 4 %
HCT: 35.8 % — ABNORMAL LOW (ref 39.0–52.0)
Hemoglobin: 11.1 g/dL — ABNORMAL LOW (ref 13.0–17.0)
Immature Granulocytes: 0 %
Lymphocytes Relative: 15 %
Lymphs Abs: 0.9 10*3/uL (ref 0.7–4.0)
MCH: 28.5 pg (ref 26.0–34.0)
MCHC: 31 g/dL (ref 30.0–36.0)
MCV: 91.8 fL (ref 80.0–100.0)
Monocytes Absolute: 0.5 10*3/uL (ref 0.1–1.0)
Monocytes Relative: 8 %
Neutro Abs: 4.3 10*3/uL (ref 1.7–7.7)
Neutrophils Relative %: 73 %
Platelet Count: 211 10*3/uL (ref 150–400)
RBC: 3.9 MIL/uL — ABNORMAL LOW (ref 4.22–5.81)
RDW: 12.8 % (ref 11.5–15.5)
WBC Count: 5.9 10*3/uL (ref 4.0–10.5)
nRBC: 0 % (ref 0.0–0.2)

## 2023-10-04 LAB — CMP (CANCER CENTER ONLY)
ALT: 9 U/L (ref 0–44)
AST: 13 U/L — ABNORMAL LOW (ref 15–41)
Albumin: 3.9 g/dL (ref 3.5–5.0)
Alkaline Phosphatase: 76 U/L (ref 38–126)
Anion gap: 6 (ref 5–15)
BUN: 24 mg/dL — ABNORMAL HIGH (ref 8–23)
CO2: 25 mmol/L (ref 22–32)
Calcium: 8.8 mg/dL — ABNORMAL LOW (ref 8.9–10.3)
Chloride: 108 mmol/L (ref 98–111)
Creatinine: 0.83 mg/dL (ref 0.61–1.24)
GFR, Estimated: >60 mL/min (ref >60)
Glucose, Bld: 112 mg/dL — ABNORMAL HIGH (ref 70–99)
Potassium: 4.4 mmol/L (ref 3.5–5.1)
Sodium: 139 mmol/L (ref 135–145)
Total Bilirubin: 0.3 mg/dL (ref 0.3–1.2)
Total Protein: 7.2 g/dL (ref 6.5–8.1)

## 2023-10-04 LAB — TSH: TSH: 0.676 u[IU]/mL (ref 0.350–4.500)

## 2023-10-04 MED ORDER — SODIUM CHLORIDE 0.9 % IV SOLN: Freq: Once | INTRAVENOUS | Status: AC

## 2023-10-04 MED ORDER — SODIUM CHLORIDE 0.9 % IV SOLN
480.0000 mg | Freq: Once | INTRAVENOUS | Status: AC
  Administered 2023-10-04: 480 mg via INTRAVENOUS
  Filled 2023-10-04: qty 48

## 2023-10-04 MED ORDER — SODIUM CHLORIDE 0.9% FLUSH: 10.0000 mL | INTRAVENOUS | Status: DC | PRN

## 2023-10-04 MED ORDER — HEPARIN SOD (PORK) LOCK FLUSH 100 UNIT/ML IV SOLN: 500.0000 [IU] | Freq: Once | INTRAVENOUS | Status: DC | PRN

## 2023-10-04 NOTE — Patient Instructions (Signed)
[place] [facility]  Discharge Instructions: Thank you for choosing [facility] to provide your oncology and hematology care.   If you have a lab appointment with the Cancer Center, please go directly to the Cancer Center and check in at the registration area.   Wear comfortable clothing and clothing appropriate for easy access to any Portacath or PICC line.   We strive to give you quality time with your provider. You may need to reschedule your appointment if you arrive late (15 or more minutes).  Arriving late affects you and other patients whose appointments are after yours.  Also, if you miss three or more appointments without notifying the office, you may be dismissed from the clinic at the provider's discretion.      For prescription refill requests, have your pharmacy contact our office and allow 72 hours for refills to be completed.    Today you received the following chemotherapy and/or immunotherapy agents: Opdivo.       To help prevent nausea and vomiting after your treatment, we encourage you to take your nausea medication as directed.  BELOW ARE SYMPTOMS THAT SHOULD BE REPORTED IMMEDIATELY: *FEVER GREATER THAN 100.4 F (38 C) OR HIGHER *CHILLS OR SWEATING *NAUSEA AND VOMITING THAT IS NOT CONTROLLED WITH YOUR NAUSEA MEDICATION *UNUSUAL SHORTNESS OF BREATH *UNUSUAL BRUISING OR BLEEDING *URINARY PROBLEMS (pain or burning when urinating, or frequent urination) *BOWEL PROBLEMS (unusual diarrhea, constipation, pain near the anus) TENDERNESS IN MOUTH AND THROAT WITH OR WITHOUT PRESENCE OF ULCERS (sore throat, sores in mouth, or a toothache) UNUSUAL RASH, SWELLING OR PAIN  UNUSUAL VAGINAL DISCHARGE OR ITCHING   Items with * indicate a potential emergency and should be followed up as soon as possible or go to the Emergency Department if any problems should occur.  Please show the CHEMOTHERAPY ALERT CARD or IMMUNOTHERAPY ALERT CARD at  check-in to the Emergency Department and triage nurse.  Should you have questions after your visit or need to cancel or reschedule your appointment, please contact [place] [facility]  Dept: [phone]  and follow the prompts.  Office hours are 8:00 a.m. to 4:30 p.m. Monday - Friday. Please note that voicemails left after 4:00 p.m. may not be returned until the following business day.  We are closed weekends and major holidays. You have access to a nurse at all times for urgent questions. Please call the main number to the clinic Dept: [phone] and follow the prompts.   For any non-urgent questions, you may also contact your provider using MyChart. We now offer e-Visits for anyone 37 and older to request care online for non-urgent symptoms. For details visit [email or web address].   Also download the MyChart app! Go to the app store, search "MyChart", open the app, select [place], and log in with your MyChart username and password.

## 2023-10-04 NOTE — Progress Notes (Signed)
[facility] [phone]   [phone]  OFFICE PROGRESS NOTE  System, Provider Not In No address on file  DIAGNOSIS: Stage IV intermediate risk clear-cell renal cell carcinoma diagnosed in September 2023 with abdominal adenopathy and pulmonary nodules.  PRIOR THERAPY:  1) Status post kidney biopsy on August 30, 2022 consistent with renal cell carcinoma. 2) Induction treatment with immunotherapy with ipilimumab 1 Mg/KG and nivolumab 3 mg/KG every 3 weeks started on September 21, 2022 status post 4 cycles with partial response.  CURRENT THERAPY: Maintenance treatment with nivolumab 480 Mg IV every 4 weeks first dose started December 14, 2022.  Status post 10 cycle of the maintenance therapy.  INTERVAL HISTORY: Anthony Sheppard 87 y.o. male returns to clinic today for follow-up visit accompanied by his god daughter Rosey Bath. Discussed the use of AI scribe software for clinical note transcription with the patient, who gave verbal consent to proceed.  History of Present Illness   The patient, Anthony Sheppard, an 87 year old individual with a history of metastatic renal cell carcinoma, has been undergoing immunotherapy treatment since September 2023. Initially, the treatment regimen included two immunotherapy drugs, Ipilumumab and nivolumab. After the first four cycles, the treatment was adjusted to only include nivolumab, administered every four weeks. The patient has completed a total of fourteen cycles of this treatment regimen.  In the four weeks since the last consultation, the patient reports no adverse effects from the immunotherapy. Specifically, he denies experiencing chest pain, breathing issues, nausea, vomiting, weight loss, or night sweats. There has been a minor weight loss of approximately one pound since the last visit, which the patient attributes to a change in clothing. The patient's last scan, conducted a month prior, showed positive results.        MEDICAL HISTORY: Past Medical History:  Diagnosis Date   Diabetes mellitus    Enlarged prostate    Hypertension    Sleep apnea     ALLERGIES:  is allergic to aspirin.  MEDICATIONS:  Current Outpatient Medications  Medication Sig Dispense Refill   ADVIL 200 MG CAPS Take 400 mg by mouth every 6 (six) hours as needed (for mild pain or headaches). (Patient not taking: Reported on 09/05/2023)     ALEVE 220 MG tablet Take 220-440 mg by mouth 2 (two) times daily as needed (for mild pain or headaches). (Patient not taking: Reported on 09/05/2023)     amLODipine (NORVASC) 5 MG tablet Take 5 mg by mouth daily.     cetirizine (ZYRTEC) 10 MG tablet Take 10 mg by mouth at bedtime.     finasteride (PROSCAR) 5 MG tablet Take 5 mg by mouth at bedtime.     tamsulosin (FLOMAX) 0.4 MG CAPS capsule Take 0.4 mg by mouth at bedtime.      No current facility-administered medications for this visit.    SURGICAL HISTORY: No past surgical history on file.  REVIEW OF SYSTEMS:  A comprehensive review of systems was negative.   PHYSICAL EXAMINATION: General appearance: alert, cooperative, and no distress Head: Normocephalic, without obvious abnormality, atraumatic Neck: no adenopathy, no JVD, supple, symmetrical, trachea midline, and thyroid not enlarged, symmetric, no tenderness/mass/nodules Lymph nodes: Cervical, supraclavicular, and axillary nodes normal. Resp: clear to auscultation bilaterally Back: symmetric, no curvature. ROM normal. No CVA tenderness. Cardio: regular rate and rhythm, S1, S2 normal, no murmur, click, rub or gallop GI: soft, non-tender; bowel sounds normal; no masses,  no organomegaly Extremities: extremities normal, atraumatic, no cyanosis or edema  ECOG PERFORMANCE STATUS: 1 - Symptomatic but completely ambulatory  Blood pressure (!) 149/88, pulse 67, temperature (!) 97.4 F (36.3 C), temperature source Oral, resp. rate 16, height 5\' 1"  (1.549 m), weight 125 lb 6.4 oz (56.9  kg), SpO2 100%.  LABORATORY DATA: Lab Results  Component Value Date   WBC 5.9 10/04/2023   HGB 11.1 (L) 10/04/2023   HCT 35.8 (L) 10/04/2023   MCV 91.8 10/04/2023   PLT 211 10/04/2023      Chemistry      Component Value Date/Time   NA 137 09/05/2023 1023   K 4.1 09/05/2023 1023   CL 107 09/05/2023 1023   CO2 25 09/05/2023 1023   BUN 21 09/05/2023 1023   CREATININE 0.85 09/05/2023 1023      Component Value Date/Time   CALCIUM 8.7 (L) 09/05/2023 1023   ALKPHOS 85 09/05/2023 1023   AST 13 (L) 09/05/2023 1023   ALT 9 09/05/2023 1023   BILITOT 0.3 09/05/2023 1023       RADIOGRAPHIC STUDIES: No results found.  ASSESSMENT AND PLAN: This is a very pleasant 87 years old African-American male with a stage IV intermediate risk clear-cell carcinoma diagnosed in September 2023 status post 4 cycles of induction treatment with immunotherapy with ipilimumab and nivolumab and starting from cycle #5 he is currently on maintenance treatment with nivolumab 480 mg IV every 4 weeks status post total of 14 cycles.  He has been tolerating this treatment fairly well with no concerning adverse effects.    Metastatic Carcinoma Patient is tolerating immunotherapy well with no reported side effects. Last scan a month ago showed good response to treatment. -Continue Nivolumab every four weeks. -Administer cycle 15 of Nivolumab today. -Schedule follow-up in four weeks.   If he has any concerning symptoms in the interval. The patient voices understanding of current disease status and treatment options and is in agreement with the current care plan.  All questions were answered. The patient knows to call the clinic with any problems, questions or concerns. We can certainly see the patient much sooner if necessary.  The total time spent in the appointment was 20 minutes.  Disclaimer: This note was dictated with voice recognition software. Similar sounding words can inadvertently be transcribed and may  not be corrected upon review.

## 2023-10-05 LAB — T4: T4, Total: 5 ug/dL (ref 4.5–12.0)

## 2023-10-26 ENCOUNTER — Encounter: Payer: Self-pay | Admitting: Internal Medicine

## 2023-10-26 NOTE — Progress Notes (Signed)
[facility] OFFICE PROGRESS NOTE  System, Provider Not In No address on file  DIAGNOSIS: Stage IV intermediate risk clear-cell renal cell carcinoma diagnosed in September 2023 with abdominal adenopathy and pulmonary nodules   PRIOR THERAPY: 1) Status post kidney biopsy on August 30, 2022 consistent with renal cell carcinoma. 2) Induction treatment with immunotherapy with ipilimumab 1 Mg/KG and nivolumab 3 mg/KG every 3 weeks started on September 21, 2022 status post 4 cycles with partial response.  CURRENT THERAPY: Maintenance treatment with nivolumab 480 Mg IV every 4 weeks first dose started December 14, 2022. Status post 15 cycle of the maintenance therapy.   INTERVAL HISTORY: Anthony Sheppard 87 y.o. male returns to the clinic today for a follow-up visit accompanied by his goddaughter.  The patient is feeling well today without any concerning complaints. He is currently undergoing immunotherapy with nivolumab IV every 4 weeks and is tolerating it well without any concerning adverse side effects.  He lost a few pounds since last being seen but states he has a fair appetite. He has diet controlled diabetes. He does not drink protein supplemental drinks. He denies any recent fever, chills, or night sweats.  Denies any nausea, vomiting, diarrhea, or constipation. Sometimes with wiping, he may have some small amount of blood on the toilet paper. He states he was told he has hemorrhoids in the past. Denies any chest pain, shortness of breath, cough, or hemoptysis.  Denies any rashes or skin changes except he sometimes has itching, primarily in the bottom of his feet itching. He takes Zyrtec daily. Denies any unusual abdominal pain or back pain.  Denies any hematuria, malodorous urine, or cloudy urine.  He is here today for evaluation and repeat blood before undergoing cycle #16.   MEDICAL HISTORY: Past Medical History:  Diagnosis Date   Diabetes mellitus    Enlarged prostate     Hypertension    Sleep apnea     ALLERGIES:  is allergic to aspirin.  MEDICATIONS:  Current Outpatient Medications  Medication Sig Dispense Refill   ADVIL 200 MG CAPS Take 400 mg by mouth every 6 (six) hours as needed (for mild pain or headaches).     ALEVE 220 MG tablet Take 220-440 mg by mouth 2 (two) times daily as needed (for mild pain or headaches).     amLODipine (NORVASC) 5 MG tablet Take 5 mg by mouth daily.     cetirizine (ZYRTEC) 10 MG tablet Take 10 mg by mouth at bedtime.     finasteride (PROSCAR) 5 MG tablet Take 5 mg by mouth at bedtime.     tamsulosin (FLOMAX) 0.4 MG CAPS capsule Take 0.4 mg by mouth at bedtime.      No current facility-administered medications for this visit.    SURGICAL HISTORY: No past surgical history on file.  REVIEW OF SYSTEMS:   Review of Systems  Review of Systems  Constitutional: Negative for appetite change, chills, fatigue, fever and unexpected weight change.  HENT: Negative for mouth sores, nosebleeds, sore throat and trouble swallowing.   Eyes: Negative for eye problems and icterus.  Respiratory: Negative for cough, hemoptysis, shortness of breath and wheezing.   Cardiovascular: Negative for chest pain and leg swelling.  Gastrointestinal: Negative for abdominal pain, constipation, diarrhea, nausea and vomiting.  Genitourinary: Negative for bladder incontinence, difficulty urinating, dysuria, frequency and hematuria.   Musculoskeletal: Negative for back pain, gait problem, neck pain and neck stiffness.  Skin: Positive for itching the bottom of his feet.  Neurological: Negative for dizziness, extremity weakness, gait problem, headaches, light-headedness and seizures.  Hematological: Negative for adenopathy. Does not bruise/bleed easily.  Psychiatric/Behavioral: Negative for confusion, depression and sleep disturbance. The patient is not nervous/anxious.     PHYSICAL EXAMINATION:  Blood pressure (!) 116/57, pulse 63, temperature 98.2 F  (36.8 C), temperature source Temporal, resp. rate 18, weight 122 lb 9.6 oz (55.6 kg), SpO2 97%.  ECOG PERFORMANCE STATUS: 2  Physical Exam  Constitutional: Oriented to person, place, and time and elderly appearing male and in no distress.  HENT:  Head: Normocephalic and atraumatic.  Mouth/Throat: Oropharynx is clear and moist. No oropharyngeal exudate.  Eyes: Conjunctivae are normal. Right eye exhibits no discharge. Left eye exhibits no discharge. No scleral icterus.  Neck: Normal range of motion. Neck supple.  Cardiovascular: Normal rate, regular rhythm, normal heart sounds and intact distal pulses.   Pulmonary/Chest: Effort normal and breath sounds normal. No respiratory distress. No wheezes. No rales.  Abdominal: Soft. Bowel sounds are normal. Exhibits no distension and no mass. There is no tenderness.  Musculoskeletal: Normal range of motion. Exhibits no edema.  Lymphadenopathy:    No cervical adenopathy.  Neurological: Alert and oriented to person, place, and time. Exhibits normal muscle tone. Gait normal. Coordination normal.  Skin: Skin is warm and dry. No rash noted. Not diaphoretic. No erythema. No pallor.  Psychiatric: Mood, memory and judgment normal.  Vitals reviewed.  LABORATORY DATA: Lab Results  Component Value Date   WBC 7.0 10/31/2023   HGB 10.8 (L) 10/31/2023   HCT 34.0 (L) 10/31/2023   MCV 90.9 10/31/2023   PLT 221 10/31/2023      Chemistry      Component Value Date/Time   NA 136 10/31/2023 1007   K 4.1 10/31/2023 1007   CL 106 10/31/2023 1007   CO2 25 10/31/2023 1007   BUN 26 (H) 10/31/2023 1007   CREATININE 1.06 10/31/2023 1007      Component Value Date/Time   CALCIUM 8.5 (L) 10/31/2023 1007   ALKPHOS 81 10/31/2023 1007   AST 11 (L) 10/31/2023 1007   ALT 7 10/31/2023 1007   BILITOT 0.4 10/31/2023 1007       RADIOGRAPHIC STUDIES:  No results found.   ASSESSMENT/PLAN:  This is a very pleasant 87 year old African-American male with a stage  IV intermediate risk clear-cell carcinoma diagnosed in September 2023 status post 4 cycles of induction treatment with immunotherapy with ipilimumab and nivolumab and he is currently on maintenance treatment with nivolumab 480 mg IV every 4 weeks status post total of 15 cycles.  The patient had good response after cycle #4. He is currently tolerating his treatment with the immunotherapy fairly well with no concerning adverse effect except for itching  Labs were reviewed. Recommend he proceed with cycle #16 as scheduled.   I will see him back for follow-up visit in 4 weeks for evaluation before starting cycle #17  He was advised to try to drink Glucerna to maintain his weight.   The patient will monitor the mild blood on the toliet paper when wiping. It may be attributed to hemorrhoids. His Hbg is stable. If this continues, then the next step would be to see his gastroenterologist.   The patient was advised to call immediately if she has any concerning symptoms in the interval. The patient voices understanding of current disease status and treatment options and is in agreement with the current care plan. All questions were answered. The patient knows to call  the clinic with any problems, questions or concerns. We can certainly see the patient much sooner if necessary     No orders of the defined types were placed in this encounter.    The total time spent in the appointment was 20-29 minutes  Dotti Busey L Zedrick Springsteen, PA-C 10/31/23

## 2023-10-31 ENCOUNTER — Inpatient Hospital Stay: Payer: Medicare PPO

## 2023-10-31 ENCOUNTER — Inpatient Hospital Stay: Payer: Medicare PPO | Attending: Internal Medicine

## 2023-10-31 ENCOUNTER — Inpatient Hospital Stay (HOSPITAL_BASED_OUTPATIENT_CLINIC_OR_DEPARTMENT_OTHER): Payer: Medicare PPO | Admitting: Physician Assistant

## 2023-10-31 VITALS — BP 116/57 | HR 63 | Temp 98.2°F | Resp 18 | Wt 122.6 lb

## 2023-10-31 DIAGNOSIS — G4730 Sleep apnea, unspecified: Secondary | ICD-10-CM | POA: Diagnosis not present

## 2023-10-31 DIAGNOSIS — E119 Type 2 diabetes mellitus without complications: Secondary | ICD-10-CM | POA: Insufficient documentation

## 2023-10-31 DIAGNOSIS — Z79899 Other long term (current) drug therapy: Secondary | ICD-10-CM | POA: Insufficient documentation

## 2023-10-31 DIAGNOSIS — Z5112 Encounter for antineoplastic immunotherapy: Secondary | ICD-10-CM | POA: Diagnosis not present

## 2023-10-31 DIAGNOSIS — I10 Essential (primary) hypertension: Secondary | ICD-10-CM | POA: Insufficient documentation

## 2023-10-31 DIAGNOSIS — C649 Malignant neoplasm of unspecified kidney, except renal pelvis: Secondary | ICD-10-CM | POA: Insufficient documentation

## 2023-10-31 DIAGNOSIS — N400 Enlarged prostate without lower urinary tract symptoms: Secondary | ICD-10-CM | POA: Insufficient documentation

## 2023-10-31 DIAGNOSIS — C642 Malignant neoplasm of left kidney, except renal pelvis: Secondary | ICD-10-CM

## 2023-10-31 LAB — CMP (CANCER CENTER ONLY)
ALT: 7 U/L (ref 0–44)
AST: 11 U/L — ABNORMAL LOW (ref 15–41)
Albumin: 3.7 g/dL (ref 3.5–5.0)
Alkaline Phosphatase: 81 U/L (ref 38–126)
Anion gap: 5 (ref 5–15)
BUN: 26 mg/dL — ABNORMAL HIGH (ref 8–23)
CO2: 25 mmol/L (ref 22–32)
Calcium: 8.5 mg/dL — ABNORMAL LOW (ref 8.9–10.3)
Chloride: 106 mmol/L (ref 98–111)
Creatinine: 1.06 mg/dL (ref 0.61–1.24)
GFR, Estimated: >60 mL/min (ref >60)
Glucose, Bld: 149 mg/dL — ABNORMAL HIGH (ref 70–99)
Potassium: 4.1 mmol/L (ref 3.5–5.1)
Sodium: 136 mmol/L (ref 135–145)
Total Bilirubin: 0.4 mg/dL (ref <1.2)
Total Protein: 6.9 g/dL (ref 6.5–8.1)

## 2023-10-31 LAB — CBC WITH DIFFERENTIAL (CANCER CENTER ONLY)
Abs Immature Granulocytes: 0.01 K/uL (ref 0.00–0.07)
Basophils Absolute: 0 K/uL (ref 0.0–0.1)
Basophils Relative: 0 %
Eosinophils Absolute: 0.3 K/uL (ref 0.0–0.5)
Eosinophils Relative: 4 %
HCT: 34 % — ABNORMAL LOW (ref 39.0–52.0)
Hemoglobin: 10.8 g/dL — ABNORMAL LOW (ref 13.0–17.0)
Immature Granulocytes: 0 %
Lymphocytes Relative: 14 %
Lymphs Abs: 1 K/uL (ref 0.7–4.0)
MCH: 28.9 pg (ref 26.0–34.0)
MCHC: 31.8 g/dL (ref 30.0–36.0)
MCV: 90.9 fL (ref 80.0–100.0)
Monocytes Absolute: 0.9 K/uL (ref 0.1–1.0)
Monocytes Relative: 12 %
Neutro Abs: 4.9 K/uL (ref 1.7–7.7)
Neutrophils Relative %: 70 %
Platelet Count: 221 K/uL (ref 150–400)
RBC: 3.74 MIL/uL — ABNORMAL LOW (ref 4.22–5.81)
RDW: 12.7 % (ref 11.5–15.5)
WBC Count: 7 K/uL (ref 4.0–10.5)
nRBC: 0 % (ref 0.0–0.2)

## 2023-10-31 LAB — TSH: TSH: 1.195 u[IU]/mL (ref 0.350–4.500)

## 2023-10-31 MED ORDER — SODIUM CHLORIDE 0.9 % IV SOLN
480.0000 mg | Freq: Once | INTRAVENOUS | Status: AC
  Administered 2023-10-31: 480 mg via INTRAVENOUS
  Filled 2023-10-31: qty 48

## 2023-10-31 MED ORDER — SODIUM CHLORIDE 0.9 % IV SOLN: Freq: Once | INTRAVENOUS | Status: AC

## 2023-10-31 NOTE — Patient Instructions (Signed)
[facility] - [facility]PT OF [facility]  Discharge Instructions: Thank you for choosing [facility] to provide your oncology and hematology care.   If you have a lab appointment with the Cancer Center, please go directly to the Cancer Center and check in at the registration area.   Wear comfortable clothing and clothing appropriate for easy access to any Portacath or PICC line.   We strive to give you quality time with your provider. You may need to reschedule your appointment if you arrive late (15 or more minutes).  Arriving late affects you and other patients whose appointments are after yours.  Also, if you miss three or more appointments without notifying the office, you may be dismissed from the clinic at the provider's discretion.      For prescription refill requests, have your pharmacy contact our office and allow 72 hours for refills to be completed.    Today you received the following chemotherapy and/or immunotherapy agents opdivo      To help prevent nausea and vomiting after your treatment, we encourage you to take your nausea medication as directed.  BELOW ARE SYMPTOMS THAT SHOULD BE REPORTED IMMEDIATELY: *FEVER GREATER THAN 100.4 F (38 C) OR HIGHER *CHILLS OR SWEATING *NAUSEA AND VOMITING THAT IS NOT CONTROLLED WITH YOUR NAUSEA MEDICATION *UNUSUAL SHORTNESS OF BREATH *UNUSUAL BRUISING OR BLEEDING *URINARY PROBLEMS (pain or burning when urinating, or frequent urination) *BOWEL PROBLEMS (unusual diarrhea, constipation, pain near the anus) TENDERNESS IN MOUTH AND THROAT WITH OR WITHOUT PRESENCE OF ULCERS (sore throat, sores in mouth, or a toothache) UNUSUAL RASH, SWELLING OR PAIN  UNUSUAL VAGINAL DISCHARGE OR ITCHING   Items with * indicate a potential emergency and should be followed up as soon as possible or go to the Emergency Department if any problems should occur.  Please show the CHEMOTHERAPY ALERT CARD or IMMUNOTHERAPY  ALERT CARD at check-in to the Emergency Department and triage nurse.  Should you have questions after your visit or need to cancel or reschedule your appointment, please contact [facility] - A DEPT OF [facility]  Dept: [phone]  and follow the prompts.  Office hours are 8:00 a.m. to 4:30 p.m. Monday - Friday. Please note that voicemails left after 4:00 p.m. may not be returned until the following business day.  We are closed weekends and major holidays. You have access to a nurse at all times for urgent questions. Please call the main number to the clinic Dept: [phone] and follow the prompts.   For any non-urgent questions, you may also contact your provider using MyChart. We now offer e-Visits for anyone 40 and older to request care online for non-urgent symptoms. For details visit [email or web address].   Also download the MyChart app! Go to the app store, search "MyChart", open the app, select [place], and log in with your MyChart username and password.

## 2023-11-01 LAB — T4: T4, Total: 5.1 ug/dL (ref 4.5–12.0)

## 2023-11-17 ENCOUNTER — Other Ambulatory Visit: Payer: Self-pay

## 2023-11-25 ENCOUNTER — Encounter: Payer: Self-pay | Admitting: Internal Medicine

## 2023-11-25 ENCOUNTER — Telehealth: Payer: Self-pay | Admitting: Internal Medicine

## 2023-11-25 ENCOUNTER — Other Ambulatory Visit: Payer: Self-pay | Admitting: Physician Assistant

## 2023-11-25 DIAGNOSIS — C642 Malignant neoplasm of left kidney, except renal pelvis: Secondary | ICD-10-CM

## 2023-11-25 NOTE — Progress Notes (Unsigned)
[facility] OFFICE PROGRESS NOTE  System, Provider Not In No address on file  DIAGNOSIS: Stage IV intermediate risk clear-cell renal cell carcinoma diagnosed in September 2023 with abdominal adenopathy and pulmonary nodules   PRIOR THERAPY:  1) Status post kidney biopsy on August 30, 2022 consistent with renal cell carcinoma. 2) Induction treatment with immunotherapy with ipilimumab 1 Mg/KG and nivolumab 3 mg/KG every 3 weeks started on September 21, 2022 status post 4 cycles with partial response.  CURRENT THERAPY: Maintenance treatment with nivolumab 480 Mg IV every 4 weeks first dose started December 14, 2022. Status post 16 cycle of the maintenance therapy.   INTERVAL HISTORY: Argil Handley 87 y.o. male returns to the clinic today for a follow-up visit accompanied by his goddaughter.  The patient is feeling well today without any concerning complaints. He is currently undergoing immunotherapy with nivolumab IV every 4 weeks and is tolerating it well without any concerning adverse side effects.  He lost a few pounds since last being seen but states he has a fair appetite. He has diet controlled diabetes. He does not drink protein supplemental drinks. He denies any recent fever, chills, or night sweats.  Denies any nausea, vomiting, diarrhea, or constipation. Sometimes with wiping, he may have some small amount of blood on the toilet paper. He states he was told he has hemorrhoids in the past. Denies any chest pain, shortness of breath, cough, or hemoptysis.  Denies any rashes or skin changes except he sometimes has itching, primarily in the bottom of his feet itching. He takes Zyrtec daily. Denies any unusual abdominal pain or back pain.  Denies any hematuria, malodorous urine, or cloudy urine.  He is here today for evaluation and repeat blood before undergoing cycle #17.   MEDICAL HISTORY: Past Medical History:  Diagnosis Date   Diabetes mellitus    Enlarged prostate     Hypertension    Sleep apnea     ALLERGIES:  is allergic to aspirin.  MEDICATIONS:  Current Outpatient Medications  Medication Sig Dispense Refill   ADVIL 200 MG CAPS Take 400 mg by mouth every 6 (six) hours as needed (for mild pain or headaches).     ALEVE 220 MG tablet Take 220-440 mg by mouth 2 (two) times daily as needed (for mild pain or headaches).     amLODipine (NORVASC) 5 MG tablet Take 5 mg by mouth daily.     cetirizine (ZYRTEC) 10 MG tablet Take 10 mg by mouth at bedtime.     finasteride (PROSCAR) 5 MG tablet Take 5 mg by mouth at bedtime.     tamsulosin (FLOMAX) 0.4 MG CAPS capsule Take 0.4 mg by mouth at bedtime.      No current facility-administered medications for this visit.    SURGICAL HISTORY: No past surgical history on file.  REVIEW OF SYSTEMS:   Review of Systems  Constitutional: Negative for appetite change, chills, fatigue, fever and unexpected weight change.  HENT:   Negative for mouth sores, nosebleeds, sore throat and trouble swallowing.   Eyes: Negative for eye problems and icterus.  Respiratory: Negative for cough, hemoptysis, shortness of breath and wheezing.   Cardiovascular: Negative for chest pain and leg swelling.  Gastrointestinal: Negative for abdominal pain, constipation, diarrhea, nausea and vomiting.  Genitourinary: Negative for bladder incontinence, difficulty urinating, dysuria, frequency and hematuria.   Musculoskeletal: Negative for back pain, gait problem, neck pain and neck stiffness.  Skin: Negative for itching and rash.  Neurological: Negative for dizziness,  extremity weakness, gait problem, headaches, light-headedness and seizures.  Hematological: Negative for adenopathy. Does not bruise/bleed easily.  Psychiatric/Behavioral: Negative for confusion, depression and sleep disturbance. The patient is not nervous/anxious.     PHYSICAL EXAMINATION:  There were no vitals taken for this visit.  ECOG PERFORMANCE STATUS: {CHL ONC ECOG  [id number]  Physical Exam  Constitutional: Oriented to person, place, and time and well-developed, well-nourished, and in no distress. No distress.  HENT:  Head: Normocephalic and atraumatic.  Mouth/Throat: Oropharynx is clear and moist. No oropharyngeal exudate.  Eyes: Conjunctivae are normal. Right eye exhibits no discharge. Left eye exhibits no discharge. No scleral icterus.  Neck: Normal range of motion. Neck supple.  Cardiovascular: Normal rate, regular rhythm, normal heart sounds and intact distal pulses.   Pulmonary/Chest: Effort normal and breath sounds normal. No respiratory distress. No wheezes. No rales.  Abdominal: Soft. Bowel sounds are normal. Exhibits no distension and no mass. There is no tenderness.  Musculoskeletal: Normal range of motion. Exhibits no edema.  Lymphadenopathy:    No cervical adenopathy.  Neurological: Alert and oriented to person, place, and time. Exhibits normal muscle tone. Gait normal. Coordination normal.  Skin: Skin is warm and dry. No rash noted. Not diaphoretic. No erythema. No pallor.  Psychiatric: Mood, memory and judgment normal.  Vitals reviewed.  LABORATORY DATA: Lab Results  Component Value Date   WBC 7.0 10/31/2023   HGB 10.8 (L) 10/31/2023   HCT 34.0 (L) 10/31/2023   MCV 90.9 10/31/2023   PLT 221 10/31/2023      Chemistry      Component Value Date/Time   NA 136 10/31/2023 1007   K 4.1 10/31/2023 1007   CL 106 10/31/2023 1007   CO2 25 10/31/2023 1007   BUN 26 (H) 10/31/2023 1007   CREATININE 1.06 10/31/2023 1007      Component Value Date/Time   CALCIUM 8.5 (L) 10/31/2023 1007   ALKPHOS 81 10/31/2023 1007   AST 11 (L) 10/31/2023 1007   ALT 7 10/31/2023 1007   BILITOT 0.4 10/31/2023 1007       RADIOGRAPHIC STUDIES:  No results found.   ASSESSMENT/PLAN:  This is a very pleasant 87 year old African-American male with a stage IV intermediate risk clear-cell carcinoma diagnosed in September 2023 status post 4 cycles  of induction treatment with immunotherapy with ipilimumab and nivolumab and he is currently on maintenance treatment with nivolumab 480 mg IV every 4 weeks status post total of 16 cycles.  The patient had good response after cycle #4. He is currently tolerating his treatment with the immunotherapy fairly well with no concerning adverse effect except for itching.    Labs were reviewed. Recommend he proceed with cycle #17 as scheduled.   I will see him back for follow-up visit in 4 weeks for evaluation before starting cycle #18   He was advised to try to drink Glucerna to maintain his weight.   I will arrange for a restaging CT scan of the CAP prior to his next appointment.    The patient will monitor the mild blood on the toliet paper when wiping. It may be attributed to hemorrhoids. His Hbg is stable. If this continues, then the next step would be to see his gastroenterologist. ***  The patient was advised to call immediately if he has any concerning symptoms in the interval. The patient voices understanding of current disease status and treatment options and is in agreement with the current care plan. All questions were answered. The  patient knows to call the clinic with any problems, questions or concerns. We can certainly see the patient much sooner if necessary     No orders of the defined types were placed in this encounter.    I spent {CHL ONC TIME VISIT - NWGNF:[id number]} counseling the patient face to face. The total time spent in the appointment was {CHL ONC TIME VISIT - IONGE:[id number]}.  Samona Chihuahua L Alfonse Garringer, PA-C 11/25/23

## 2023-11-26 ENCOUNTER — Other Ambulatory Visit: Payer: Self-pay

## 2023-11-29 ENCOUNTER — Inpatient Hospital Stay (HOSPITAL_BASED_OUTPATIENT_CLINIC_OR_DEPARTMENT_OTHER): Payer: Medicare PPO | Admitting: Physician Assistant

## 2023-11-29 ENCOUNTER — Inpatient Hospital Stay: Payer: Medicare PPO | Attending: Oncology

## 2023-11-29 ENCOUNTER — Inpatient Hospital Stay: Payer: Medicare PPO

## 2023-11-29 VITALS — BP 155/79 | HR 61 | Temp 97.8°F | Resp 15 | Wt 123.0 lb

## 2023-11-29 DIAGNOSIS — C642 Malignant neoplasm of left kidney, except renal pelvis: Secondary | ICD-10-CM | POA: Diagnosis not present

## 2023-11-29 DIAGNOSIS — C649 Malignant neoplasm of unspecified kidney, except renal pelvis: Secondary | ICD-10-CM | POA: Diagnosis not present

## 2023-11-29 DIAGNOSIS — Z5112 Encounter for antineoplastic immunotherapy: Secondary | ICD-10-CM | POA: Insufficient documentation

## 2023-11-29 DIAGNOSIS — Z79899 Other long term (current) drug therapy: Secondary | ICD-10-CM | POA: Insufficient documentation

## 2023-11-29 LAB — CBC WITH DIFFERENTIAL (CANCER CENTER ONLY)
Abs Immature Granulocytes: 0.02 10*3/uL (ref 0.00–0.07)
Basophils Absolute: 0 10*3/uL (ref 0.0–0.1)
Basophils Relative: 1 %
Eosinophils Absolute: 0.3 10*3/uL (ref 0.0–0.5)
Eosinophils Relative: 4 %
HCT: 35.2 % — ABNORMAL LOW (ref 39.0–52.0)
Hemoglobin: 11 g/dL — ABNORMAL LOW (ref 13.0–17.0)
Immature Granulocytes: 0 %
Lymphocytes Relative: 14 %
Lymphs Abs: 1 10*3/uL (ref 0.7–4.0)
MCH: 29 pg (ref 26.0–34.0)
MCHC: 31.3 g/dL (ref 30.0–36.0)
MCV: 92.9 fL (ref 80.0–100.0)
Monocytes Absolute: 0.8 10*3/uL (ref 0.1–1.0)
Monocytes Relative: 11 %
Neutro Abs: 5.1 10*3/uL (ref 1.7–7.7)
Neutrophils Relative %: 70 %
Platelet Count: 229 10*3/uL (ref 150–400)
RBC: 3.79 MIL/uL — ABNORMAL LOW (ref 4.22–5.81)
RDW: 12.5 % (ref 11.5–15.5)
WBC Count: 7.3 10*3/uL (ref 4.0–10.5)
nRBC: 0 % (ref 0.0–0.2)

## 2023-11-29 LAB — CMP (CANCER CENTER ONLY)
ALT: 8 U/L (ref 0–44)
AST: 12 U/L — ABNORMAL LOW (ref 15–41)
Albumin: 3.7 g/dL (ref 3.5–5.0)
Alkaline Phosphatase: 84 U/L (ref 38–126)
Anion gap: 6 (ref 5–15)
BUN: 17 mg/dL (ref 8–23)
CO2: 25 mmol/L (ref 22–32)
Calcium: 8.5 mg/dL — ABNORMAL LOW (ref 8.9–10.3)
Chloride: 105 mmol/L (ref 98–111)
Creatinine: 0.82 mg/dL (ref 0.61–1.24)
GFR, Estimated: >60 mL/min (ref >60)
Glucose, Bld: 114 mg/dL — ABNORMAL HIGH (ref 70–99)
Potassium: 4.5 mmol/L (ref 3.5–5.1)
Sodium: 136 mmol/L (ref 135–145)
Total Bilirubin: 0.4 mg/dL (ref <1.2)
Total Protein: 7.2 g/dL (ref 6.5–8.1)

## 2023-11-29 LAB — TSH: TSH: 0.927 u[IU]/mL (ref 0.350–4.500)

## 2023-11-29 MED ORDER — NIVOLUMAB CHEMO INJECTION 100 MG/10ML
480.0000 mg | Freq: Once | INTRAVENOUS | Status: AC
  Administered 2023-11-29: 480 mg via INTRAVENOUS
  Filled 2023-11-29: qty 48

## 2023-11-29 MED ORDER — SODIUM CHLORIDE 0.9 % IV SOLN: Freq: Once | INTRAVENOUS | Status: AC

## 2023-11-29 MED ORDER — HEPARIN SOD (PORK) LOCK FLUSH 100 UNIT/ML IV SOLN
500.0000 [IU] | Freq: Once | INTRAVENOUS | Status: DC | PRN
Start: 2023-11-29 — End: 2023-11-29

## 2023-11-29 MED ORDER — SODIUM CHLORIDE 0.9% FLUSH: 10.0000 mL | INTRAVENOUS | Status: DC | PRN

## 2023-11-29 NOTE — Patient Instructions (Signed)
 [facility]  Discharge Instructions: Thank you for choosing [facility] to provide your oncology and hematology care.   If you have a lab appointment with the Cancer Center, please go directly to the Cancer Center and check in at the registration area.   Wear comfortable clothing and clothing appropriate for easy access to any Portacath or PICC line.   We strive to give you quality time with your provider. You may need to reschedule your appointment if you arrive late (15 or more minutes).  Arriving late affects you and other patients whose appointments are after yours.  Also, if you miss three or more appointments without notifying the office, you may be dismissed from the clinic at the provider's discretion.      For prescription refill requests, have your pharmacy contact our office and allow 72 hours for refills to be completed.    Today you received the following chemotherapy and/or immunotherapy agents opdivo      To help prevent nausea and vomiting after your treatment, we encourage you to take your nausea medication as directed.  BELOW ARE SYMPTOMS THAT SHOULD BE REPORTED IMMEDIATELY: *FEVER GREATER THAN 100.4 F (38 C) OR HIGHER *CHILLS OR SWEATING *NAUSEA AND VOMITING THAT IS NOT CONTROLLED WITH YOUR NAUSEA MEDICATION *UNUSUAL SHORTNESS OF BREATH *UNUSUAL BRUISING OR BLEEDING *URINARY PROBLEMS (pain or burning when urinating, or frequent urination) *BOWEL PROBLEMS (unusual diarrhea, constipation, pain near the anus) TENDERNESS IN MOUTH AND THROAT WITH OR WITHOUT PRESENCE OF ULCERS (sore throat, sores in mouth, or a toothache) UNUSUAL RASH, SWELLING OR PAIN  UNUSUAL VAGINAL DISCHARGE OR ITCHING   Items with * indicate a potential emergency and should be followed up as soon as possible or go to the Emergency Department if any problems should occur.  Please show the CHEMOTHERAPY ALERT CARD or IMMUNOTHERAPY  ALERT CARD at check-in to the Emergency Department and triage nurse.  Should you have questions after your visit or need to cancel or reschedule your appointment, please contact [facility] - [facility]  Dept: [phone]  and follow the prompts.  Office hours are 8:00 a.m. to 4:30 p.m. Monday - Friday. Please note that voicemails left after 4:00 p.m. may not be returned until the following business day.  We are closed weekends and major holidays. You have access to a nurse at all times for urgent questions. Please call the main number to the clinic Dept: [phone] and follow the prompts.   For any non-urgent questions, you may also contact your provider using MyChart. We now offer e-Visits for anyone 74 and older to request care online for non-urgent symptoms. For details visit [email or web address].   Also download the MyChart app! Go to the app store, search "MyChart", open the app, select [place], and log in with your MyChart username and password.

## 2023-11-30 LAB — T4: T4, Total: 5.1 ug/dL (ref 4.5–12.0)

## 2023-12-01 ENCOUNTER — Other Ambulatory Visit: Payer: Self-pay

## 2023-12-09 ENCOUNTER — Other Ambulatory Visit: Payer: Self-pay

## 2023-12-19 ENCOUNTER — Ambulatory Visit (HOSPITAL_COMMUNITY)
Admission: RE | Admit: 2023-12-19 | Discharge: 2023-12-19 | Disposition: A | Discharge status: Home / Self Care | Payer: Medicare PPO | Source: Ambulatory Visit | Attending: Physician Assistant | Admitting: Physician Assistant

## 2023-12-19 DIAGNOSIS — C642 Malignant neoplasm of left kidney, except renal pelvis: Secondary | ICD-10-CM | POA: Diagnosis not present

## 2023-12-19 DIAGNOSIS — N289 Disorder of kidney and ureter, unspecified: Secondary | ICD-10-CM | POA: Diagnosis not present

## 2023-12-19 DIAGNOSIS — K4090 Unilateral inguinal hernia, without obstruction or gangrene, not specified as recurrent: Secondary | ICD-10-CM | POA: Diagnosis not present

## 2023-12-19 DIAGNOSIS — N400 Enlarged prostate without lower urinary tract symptoms: Secondary | ICD-10-CM | POA: Diagnosis not present

## 2023-12-19 DIAGNOSIS — R918 Other nonspecific abnormal finding of lung field: Secondary | ICD-10-CM | POA: Diagnosis not present

## 2023-12-20 ENCOUNTER — Other Ambulatory Visit: Payer: Self-pay | Admitting: Internal Medicine

## 2023-12-20 DIAGNOSIS — C642 Malignant neoplasm of left kidney, except renal pelvis: Secondary | ICD-10-CM

## 2023-12-23 NOTE — Progress Notes (Deleted)
 [facility] OFFICE PROGRESS NOTE  System, Provider Not In No address on file  DIAGNOSIS: Stage IV intermediate risk clear-cell renal cell carcinoma diagnosed in September 2023 with abdominal adenopathy and pulmonary nodules   PRIOR THERAPY: 1) Status post kidney biopsy on August 30, 2022 consistent with renal cell carcinoma. 2) Induction treatment with immunotherapy with ipilimumab  1 Mg/KG and nivolumab  3 mg/KG every 3 weeks started on September 21, 2022 status post 4 cycles with partial response.  CURRENT THERAPY: Maintenance treatment with nivolumab  480 Mg IV every 4 weeks first dose started December 14, 2022. Status post 17 cycle of the maintenance therapy.   INTERVAL HISTORY: Anthony Sheppard 88 y.o. male returns to the clinic today for a follow-up visit accompanied by his goddaughter.  The patient is feeling well today without any concerning complaints. He is currently undergoing immunotherapy with nivolumab  IV every 4 weeks and is tolerating it well without any concerning adverse side effects.     He denies any recent fever, chills, or night sweats.  Denies any nausea, vomiting, diarrhea, or constipation. Denies any chest pain, shortness of breath, cough, or hemoptysis.  Denies any rashes or skin changes except he sometimes has itching, primarily in the bottom of his feet itching. He takes Zyrtec  daily. Denies any unusual abdominal pain or back pain.  Denies any hematuria, malodorous urine, or cloudy urine.  He recently had a restaging CT scan performed. He is here today for evaluation and to review his scan before undergoing cycle #18   MEDICAL HISTORY: Past Medical History:  Diagnosis Date   Diabetes mellitus    Enlarged prostate    Hypertension    Sleep apnea     ALLERGIES:  is allergic to aspirin.  MEDICATIONS:  Current Outpatient Medications  Medication Sig Dispense Refill   ADVIL  200 MG CAPS Take 400 mg by mouth every 6 (six) hours as needed (for mild pain or  headaches).     ALEVE  220 MG tablet Take 220-440 mg by mouth 2 (two) times daily as needed (for mild pain or headaches).     amLODipine (NORVASC) 5 MG tablet Take 5 mg by mouth daily.     cetirizine  (ZYRTEC ) 10 MG tablet Take 10 mg by mouth at bedtime.     finasteride (PROSCAR) 5 MG tablet Take 5 mg by mouth at bedtime.     tamsulosin (FLOMAX) 0.4 MG CAPS capsule Take 0.4 mg by mouth at bedtime.      No current facility-administered medications for this visit.    SURGICAL HISTORY: No past surgical history on file.  REVIEW OF SYSTEMS:   Review of Systems  Constitutional: Negative for appetite change, chills, fatigue, fever and unexpected weight change.  HENT:   Negative for mouth sores, nosebleeds, sore throat and trouble swallowing.   Eyes: Negative for eye problems and icterus.  Respiratory: Negative for cough, hemoptysis, shortness of breath and wheezing.   Cardiovascular: Negative for chest pain and leg swelling.  Gastrointestinal: Negative for abdominal pain, constipation, diarrhea, nausea and vomiting.  Genitourinary: Negative for bladder incontinence, difficulty urinating, dysuria, frequency and hematuria.   Musculoskeletal: Negative for back pain, gait problem, neck pain and neck stiffness.  Skin: Negative for itching and rash.  Neurological: Negative for dizziness, extremity weakness, gait problem, headaches, light-headedness and seizures.  Hematological: Negative for adenopathy. Does not bruise/bleed easily.  Psychiatric/Behavioral: Negative for confusion, depression and sleep disturbance. The patient is not nervous/anxious.     PHYSICAL EXAMINATION:  There were no vitals  taken for this visit.  ECOG PERFORMANCE STATUS: {CHL ONC ECOG [id number]  Physical Exam  Constitutional: Oriented to person, place, and time and well-developed, well-nourished, and in no distress. No distress.  HENT:  Head: Normocephalic and atraumatic.  Mouth/Throat: Oropharynx is clear and moist.  No oropharyngeal exudate.  Eyes: Conjunctivae are normal. Right eye exhibits no discharge. Left eye exhibits no discharge. No scleral icterus.  Neck: Normal range of motion. Neck supple.  Cardiovascular: Normal rate, regular rhythm, normal heart sounds and intact distal pulses.   Pulmonary/Chest: Effort normal and breath sounds normal. No respiratory distress. No wheezes. No rales.  Abdominal: Soft. Bowel sounds are normal. Exhibits no distension and no mass. There is no tenderness.  Musculoskeletal: Normal range of motion. Exhibits no edema.  Lymphadenopathy:    No cervical adenopathy.  Neurological: Alert and oriented to person, place, and time. Exhibits normal muscle tone. Gait normal. Coordination normal.  Skin: Skin is warm and dry. No rash noted. Not diaphoretic. No erythema. No pallor.  Psychiatric: Mood, memory and judgment normal.  Vitals reviewed.  LABORATORY DATA: Lab Results  Component Value Date   WBC 7.3 11/29/2023   HGB 11.0 (L) 11/29/2023   HCT 35.2 (L) 11/29/2023   MCV 92.9 11/29/2023   PLT 229 11/29/2023      Chemistry      Component Value Date/Time   NA 136 11/29/2023 0856   K 4.5 11/29/2023 0856   CL 105 11/29/2023 0856   CO2 25 11/29/2023 0856   BUN 17 11/29/2023 0856   CREATININE 0.82 11/29/2023 0856      Component Value Date/Time   CALCIUM 8.5 (L) 11/29/2023 0856   ALKPHOS 84 11/29/2023 0856   AST 12 (L) 11/29/2023 0856   ALT 8 11/29/2023 0856   BILITOT 0.4 11/29/2023 0856       RADIOGRAPHIC STUDIES:  CT CHEST ABDOMEN PELVIS WO CONTRAST Result Date: 12/19/2023 CLINICAL DATA:  History of metastatic renal cell carcinoma, follow-up. * Tracking Code: BO * EXAM: CT CHEST, ABDOMEN AND PELVIS WITHOUT CONTRAST TECHNIQUE: Multidetector CT imaging of the chest, abdomen and pelvis was performed following the standard protocol without IV contrast. RADIATION DOSE REDUCTION: This exam was performed according to the departmental dose-optimization program which  includes automated exposure control, adjustment of the mA and/or kV according to patient size and/or use of iterative reconstruction technique. COMPARISON:  Multiple priors including most recent CT August 30, 2023 FINDINGS: CT CHEST FINDINGS Cardiovascular: Scattered aortic atherosclerosis. Normal size heart. Coronary artery calcifications. No significant pericardial effusion/thickening. Mediastinum/Nodes: Enlarged multinodular thyroid  gland again seen with 2.1 cm left thyroid  nodule, unchanged. In the setting of significant comorbidities or limited life expectancy, no follow-up recommended (ref: J Am Coll Radiol. 2015 Feb;12(2): 143-50). No pathologically enlarged mediastinal, hilar or axillary lymph nodes noting limited evaluation of the hilar structures on noncontrast enhanced examination. The esophagus is grossly unremarkable. Lungs/Pleura: Stable 3 mm left upper lobe pulmonary nodule on image 30/4. Stable 4 mm left perifissural pulmonary nodule on image 70/4. No new suspicious pulmonary nodules or masses. Similar scarring in the right-greater-than-left lower lobes with associated cylindrical bronchiectasis/bronchiolectasis. No pleural effusion.  No pneumothorax. Musculoskeletal: No significant interval change in the T12 lytic lesion with associated compression deformity. No new suspicious lytic or blastic lesions of bone. CT ABDOMEN PELVIS FINDINGS Hepatobiliary: Scattered hypodensities measure up to 2.5 cm in the right lobe of the liver on image 41/2 not hypermetabolic on prior PET-CT and favored to reflect cysts. Gallbladder is unremarkable. No biliary ductal  dilation. Pancreas: No pancreatic ductal dilation or evidence of acute inflammation. Spleen: No splenomegaly. Adrenals/Urinary Tract: Bilateral adrenal glands appear normal. Left upper pole renal lesion not well evaluated on noncontrast enhanced examination measures proximally 2.6 cm on coronal image 94/5, unchanged from recent prior examination.  Right lower pole renal cyst. No hydronephrosis or nephrolithiasis. Urinary bladder is effaced by an enlarged prostate gland. Symmetric urinary bladder wall thickening. Stomach/Bowel: Stomach is unremarkable for degree of distension. No pathologic dilation of small or large bowel. No evidence of acute bowel inflammation. Duodenal diverticulum. Vascular/Lymphatic: Scattered aortic atherosclerosis. Normal caliber abdominal aorta. Smooth IVC contours. No pathologically enlarged abdominal or pelvic lymph nodes identified. Reproductive: Similar marked prostate enlargement. Other: Fat containing right inguinal hernia also contains a small portion of the anterolateral right bladder wall. Small fat containing umbilical hernia. Musculoskeletal: No new aggressive lytic or blastic lesion of bone. Multilevel degenerative change of the spine with degenerative anterolisthesis at L4-L5 and L5-S1, unchanged. Degenerative change of the bilateral hips and SI joints. Chronic osseous changes of the pubic symphysis IMPRESSION: 1. Stable examination without new or progressive findings in the chest, abdomen or pelvis. 2. Stable left upper pole renal lesion not well evaluated on noncontrast enhanced examination. 3. No significant interval change in the T12 lytic lesion with associated compression deformity. 4. Stable small left-sided pulmonary nodules. No new suspicious pulmonary nodules or masses. 5. Similar marked prostate enlargement with symmetric urinary bladder wall thickening, likely reflecting chronic bladder outlet obstruction. 6. Fat containing right inguinal hernia also contains a small portion of the anterolateral right bladder wall. 7.  Aortic Atherosclerosis (ICD10-I70.0). Electronically Signed   By: Reyes Holder M.D.   On: 12/19/2023 09:18     ASSESSMENT/PLAN:  This is a very pleasant 88 year old African-American male with a stage IV intermediate risk clear-cell carcinoma diagnosed in September 2023 status post 4  cycles of induction treatment with immunotherapy with ipilimumab  and nivolumab  and he is currently on maintenance treatment with nivolumab  480 mg IV every 4 weeks status post total of 17 cycles.  The patient had good response after cycle #4. He is currently tolerating his treatment with the immunotherapy fairly well with no concerning adverse effect except for itching.   The patient was seen with Dr. Sherrod today.  Dr. Sherrod personally and independently reviewed the scan and discussed results with the patient today.  The scan showed ***.  Dr. Sherrod recommends ***    Labs were reviewed. Recommend he proceed with cycle #18 as scheduled.   I will see him back for follow-up visit in 4 weeks for evaluation before starting cycle #19  The patient was advised to call immediately if she has any concerning symptoms in the interval. The patient voices understanding of current disease status and treatment options and is in agreement with the current care plan. All questions were answered. The patient knows to call the clinic with any problems, questions or concerns. We can certainly see the patient much sooner if necessary\     No orders of the defined types were placed in this encounter.    I spent {CHL ONC TIME VISIT - DTPQU:[id number]} counseling the patient face to face. The total time spent in the appointment was {CHL ONC TIME VISIT - DTPQU:[id number]}.  Latysha Thackston L Jackelin Correia, PA-C 12/23/23

## 2023-12-27 ENCOUNTER — Inpatient Hospital Stay: Payer: Medicare PPO | Attending: Oncology

## 2023-12-27 ENCOUNTER — Telehealth: Payer: Self-pay | Admitting: Physician Assistant

## 2023-12-27 ENCOUNTER — Other Ambulatory Visit: Payer: Self-pay | Admitting: Physician Assistant

## 2023-12-27 ENCOUNTER — Inpatient Hospital Stay: Payer: Medicare PPO

## 2023-12-27 ENCOUNTER — Inpatient Hospital Stay: Payer: Medicare PPO | Admitting: Physician Assistant

## 2023-12-27 DIAGNOSIS — C642 Malignant neoplasm of left kidney, except renal pelvis: Secondary | ICD-10-CM

## 2023-12-27 NOTE — Telephone Encounter (Signed)
 I spoke with patient God daughter Rosey Bath to reschedule due to weather. ([place] ice).Marland KitchenMarland Kitchen

## 2023-12-28 ENCOUNTER — Telehealth: Payer: Self-pay | Admitting: Internal Medicine

## 2023-12-29 ENCOUNTER — Other Ambulatory Visit: Payer: Self-pay

## 2024-01-05 NOTE — Progress Notes (Signed)
[facility] OFFICE PROGRESS NOTE  System, Provider Not In No address on file  DIAGNOSIS: Stage IV intermediate risk clear-cell renal cell carcinoma diagnosed in September 2023 with abdominal adenopathy and pulmonary nodules   PRIOR THERAPY: 1) Status post kidney biopsy on August 30, 2022 consistent with renal cell carcinoma. 2) Induction treatment with immunotherapy with ipilimumab 1 Mg/KG and nivolumab 3 mg/KG every 3 weeks started on September 21, 2022 status post 4 cycles with partial response.  CURRENT THERAPY: Maintenance treatment with nivolumab 480 Mg IV every 4 weeks first dose started December 14, 2022. Status post 17 cycle of the maintenance therapy.   INTERVAL HISTORY: Anthony Sheppard 88 y.o. male returns to the clinic today for a follow-up visit. The patient is feeling well today without any concerning complaints. He is currently undergoing immunotherapy with nivolumab IV every 4 weeks and is tolerating it well without any concerning adverse side effects.     He denies any recent fever, chills, or night sweats.  Denies any nausea, vomiting, diarrhea, or constipation. Denies any chest pain, shortness of breath, cough, or hemoptysis. Denies any rashes or skin changes except he sometimes has itching. He previously told me this is primarily on the bottom of his feet. He takes Zyrtec daily. Denies any unusual abdominal pain or back pain.  Denies any hematuria, malodorous urine, or cloudy urine.  He recently had a restaging CT scan performed. He is here today for evaluation and to review his scan before undergoing cycle #18   MEDICAL HISTORY: Past Medical History:  Diagnosis Date   Diabetes mellitus    Enlarged prostate    Hypertension    Sleep apnea     ALLERGIES:  is allergic to aspirin.  MEDICATIONS:  Current Outpatient Medications  Medication Sig Dispense Refill   ADVIL 200 MG CAPS Take 400 mg by mouth every 6 (six) hours as needed (for mild pain or  headaches).     ALEVE 220 MG tablet Take 220-440 mg by mouth 2 (two) times daily as needed (for mild pain or headaches).     amLODipine (NORVASC) 5 MG tablet Take 5 mg by mouth daily.     cetirizine (ZYRTEC) 10 MG tablet Take 10 mg by mouth at bedtime.     finasteride (PROSCAR) 5 MG tablet Take 5 mg by mouth at bedtime.     tamsulosin (FLOMAX) 0.4 MG CAPS capsule Take 0.4 mg by mouth at bedtime.      No current facility-administered medications for this visit.    SURGICAL HISTORY: No past surgical history on file.  REVIEW OF SYSTEMS:   Constitutional: Negative for appetite change, chills, fatigue, fever and unexpected weight change.  HENT: Negative for mouth sores, nosebleeds, sore throat and trouble swallowing.   Eyes: Negative for eye problems and icterus.  Respiratory: Negative for cough, hemoptysis, shortness of breath and wheezing.   Cardiovascular: Negative for chest pain and leg swelling.  Gastrointestinal: Negative for abdominal pain, constipation, diarrhea, nausea and vomiting.  Genitourinary: Negative for bladder incontinence, difficulty urinating, dysuria, frequency and hematuria.   Musculoskeletal: Negative for back pain, gait problem, neck pain and neck stiffness.  Skin: Positive for itching the bottom of his feet.  Neurological: Negative for dizziness, extremity weakness, gait problem, headaches, light-headedness and seizures.  Hematological: Negative for adenopathy. Does not bruise/bleed easily.  Psychiatric/Behavioral: Negative for confusion, depression and sleep disturbance. The patient is not nervous/anxious.     PHYSICAL EXAMINATION:  There were no vitals taken for this visit.  ECOG PERFORMANCE STATUS: 2  Physical Exam  Constitutional: Oriented to person, place, and time and elderly appearing male and in no distress.  HENT:  Head: Normocephalic and atraumatic.  Mouth/Throat: Oropharynx is clear and moist. No oropharyngeal exudate.  Eyes: Conjunctivae are  normal. Right eye exhibits no discharge. Left eye exhibits no discharge. No scleral icterus.  Neck: Normal range of motion. Neck supple.  Cardiovascular: Normal rate, regular rhythm, normal heart sounds and intact distal pulses.   Pulmonary/Chest: Effort normal and breath sounds normal. No respiratory distress. No wheezes. No rales.  Abdominal: Soft. Bowel sounds are normal. Exhibits no distension and no mass. There is no tenderness.  Musculoskeletal: Normal range of motion. Exhibits no edema.  Lymphadenopathy:    No cervical adenopathy.  Neurological: Alert and oriented to person, place, and time. Exhibits normal muscle tone. Gait normal. Coordination normal.  Skin: Skin is warm and dry. No rash noted. Not diaphoretic. No erythema. No pallor.  Psychiatric: Mood, memory and judgment normal.  Vitals reviewed.  LABORATORY DATA: Lab Results  Component Value Date   WBC 7.3 11/29/2023   HGB 11.0 (L) 11/29/2023   HCT 35.2 (L) 11/29/2023   MCV 92.9 11/29/2023   PLT 229 11/29/2023      Chemistry      Component Value Date/Time   NA 136 11/29/2023 0856   K 4.5 11/29/2023 0856   CL 105 11/29/2023 0856   CO2 25 11/29/2023 0856   BUN 17 11/29/2023 0856   CREATININE 0.82 11/29/2023 0856      Component Value Date/Time   CALCIUM 8.5 (L) 11/29/2023 0856   ALKPHOS 84 11/29/2023 0856   AST 12 (L) 11/29/2023 0856   ALT 8 11/29/2023 0856   BILITOT 0.4 11/29/2023 0856       RADIOGRAPHIC STUDIES:  CT CHEST ABDOMEN PELVIS WO CONTRAST Result Date: 12/19/2023 CLINICAL DATA:  History of metastatic renal cell carcinoma, follow-up. * Tracking Code: BO * EXAM: CT CHEST, ABDOMEN AND PELVIS WITHOUT CONTRAST TECHNIQUE: Multidetector CT imaging of the chest, abdomen and pelvis was performed following the standard protocol without IV contrast. RADIATION DOSE REDUCTION: This exam was performed according to the departmental dose-optimization program which includes automated exposure control, adjustment of  the mA and/or kV according to patient size and/or use of iterative reconstruction technique. COMPARISON:  Multiple priors including most recent CT August 30, 2023 FINDINGS: CT CHEST FINDINGS Cardiovascular: Scattered aortic atherosclerosis. Normal size heart. Coronary artery calcifications. No significant pericardial effusion/thickening. Mediastinum/Nodes: Enlarged multinodular thyroid gland again seen with 2.1 cm left thyroid nodule, unchanged. In the setting of significant comorbidities or limited life expectancy, no follow-up recommended (ref: J Am Coll Radiol. 2015 Feb;12(2): 143-50). No pathologically enlarged mediastinal, hilar or axillary lymph nodes noting limited evaluation of the hilar structures on noncontrast enhanced examination. The esophagus is grossly unremarkable. Lungs/Pleura: Stable 3 mm left upper lobe pulmonary nodule on image 30/4. Stable 4 mm left perifissural pulmonary nodule on image 70/4. No new suspicious pulmonary nodules or masses. Similar scarring in the right-greater-than-left lower lobes with associated cylindrical bronchiectasis/bronchiolectasis. No pleural effusion.  No pneumothorax. Musculoskeletal: No significant interval change in the T12 lytic lesion with associated compression deformity. No new suspicious lytic or blastic lesions of bone. CT ABDOMEN PELVIS FINDINGS Hepatobiliary: Scattered hypodensities measure up to 2.5 cm in the right lobe of the liver on image 41/2 not hypermetabolic on prior PET-CT and favored to reflect cysts. Gallbladder is unremarkable. No biliary ductal dilation. Pancreas: No pancreatic ductal dilation or evidence of  acute inflammation. Spleen: No splenomegaly. Adrenals/Urinary Tract: Bilateral adrenal glands appear normal. Left upper pole renal lesion not well evaluated on noncontrast enhanced examination measures proximally 2.6 cm on coronal image 94/5, unchanged from recent prior examination. Right lower pole renal cyst. No hydronephrosis or  nephrolithiasis. Urinary bladder is effaced by an enlarged prostate gland. Symmetric urinary bladder wall thickening. Stomach/Bowel: Stomach is unremarkable for degree of distension. No pathologic dilation of small or large bowel. No evidence of acute bowel inflammation. Duodenal diverticulum. Vascular/Lymphatic: Scattered aortic atherosclerosis. Normal caliber abdominal aorta. Smooth IVC contours. No pathologically enlarged abdominal or pelvic lymph nodes identified. Reproductive: Similar marked prostate enlargement. Other: Fat containing right inguinal hernia also contains a small portion of the anterolateral right bladder wall. Small fat containing umbilical hernia. Musculoskeletal: No new aggressive lytic or blastic lesion of bone. Multilevel degenerative change of the spine with degenerative anterolisthesis at L4-L5 and L5-S1, unchanged. Degenerative change of the bilateral hips and SI joints. Chronic osseous changes of the pubic symphysis IMPRESSION: 1. Stable examination without new or progressive findings in the chest, abdomen or pelvis. 2. Stable left upper pole renal lesion not well evaluated on noncontrast enhanced examination. 3. No significant interval change in the T12 lytic lesion with associated compression deformity. 4. Stable small left-sided pulmonary nodules. No new suspicious pulmonary nodules or masses. 5. Similar marked prostate enlargement with symmetric urinary bladder wall thickening, likely reflecting chronic bladder outlet obstruction. 6. Fat containing right inguinal hernia also contains a small portion of the anterolateral right bladder wall. 7.  Aortic Atherosclerosis (ICD10-I70.0). Electronically Signed   By: Maudry Mayhew M.D.   On: 12/19/2023 09:18     ASSESSMENT/PLAN:  This is a very pleasant 88 year old African-American male with a stage IV intermediate risk clear-cell carcinoma diagnosed in September 2023 status post 4 cycles of induction treatment with immunotherapy with  ipilimumab and nivolumab and he is currently on maintenance treatment with nivolumab 480 mg IV every 4 weeks status post total of 17 cycles.  The patient had good response after cycle #4. He is currently tolerating his treatment with the immunotherapy fairly well with no concerning adverse effect except for itching.   The patient was seen with Dr. Arbutus Ped today.  Dr. Arbutus Ped personally and independently reviewed the scan and discussed results with the patient today.  The scan showed no evidence of disease progression. Dr. Arbutus Ped recommends he continue on the same treatment at the same dose.   Labs were reviewed. Recommend he proceed with cycle #18 as scheduled.   I will see him back for follow-up visit in 4 weeks for evaluation before starting cycle #19  The patient was advised to call immediately if she has any concerning symptoms in the interval. The patient voices understanding of current disease status and treatment options and is in agreement with the current care plan. All questions were answered. The patient knows to call the clinic with any problems, questions or concerns. We can certainly see the patient much sooner if necessary\     No orders of the defined types were placed in this encounter.    Anthony Rogalski L Haru Anspaugh, PA-C 01/05/24  ADDENDUM: Hematology/Oncology Attending: I had a face-to-face encounter with the patient today.  I reviewed his record, lab, scan and recommended his care plan.  This is a very pleasant 88 years old African-American male with stage IV clear-cell renal cell carcinoma diagnosed in September 2023.  The patient started treatment with induction immunotherapy with ipilimumab and nivolumab for 4  cycles and he is currently on maintenance treatment with nivolumab 480 Mg IV every 4 weeks status post 17 cycles.  He has been tolerating this treatment well with no concerning adverse effects. He had repeat CT scan of the chest, abdomen and pelvis performed  recently.  I personally and independently reviewed the scan and discussed the result with the patient today. His scan showed no concerning findings for disease progression. I recommended for him to proceed with cycle #18 of his maintenance treatment with nivolumab today as scheduled. The patient will come back for follow-up visit in 4 weeks for evaluation before the next dose of his treatment. He was advised to call immediately if he has any other concerning symptoms in the interval. The total time spent in the appointment was 30 minutes. Disclaimer: This note was dictated with voice recognition software. Similar sounding words can inadvertently be transcribed and may be missed upon review. Lajuana Matte, MD

## 2024-01-09 ENCOUNTER — Other Ambulatory Visit: Payer: Self-pay

## 2024-01-11 ENCOUNTER — Inpatient Hospital Stay (HOSPITAL_BASED_OUTPATIENT_CLINIC_OR_DEPARTMENT_OTHER): Payer: Medicare PPO | Admitting: Physician Assistant

## 2024-01-11 ENCOUNTER — Inpatient Hospital Stay: Payer: Medicare PPO

## 2024-01-11 ENCOUNTER — Inpatient Hospital Stay: Payer: Medicare PPO | Attending: Physician Assistant

## 2024-01-11 VITALS — BP 122/54 | HR 66 | Temp 97.5°F | Resp 13 | Wt 120.1 lb

## 2024-01-11 DIAGNOSIS — C649 Malignant neoplasm of unspecified kidney, except renal pelvis: Secondary | ICD-10-CM

## 2024-01-11 DIAGNOSIS — Z5112 Encounter for antineoplastic immunotherapy: Secondary | ICD-10-CM | POA: Diagnosis not present

## 2024-01-11 DIAGNOSIS — Z79899 Other long term (current) drug therapy: Secondary | ICD-10-CM | POA: Diagnosis not present

## 2024-01-11 DIAGNOSIS — C642 Malignant neoplasm of left kidney, except renal pelvis: Secondary | ICD-10-CM

## 2024-01-11 LAB — CMP (CANCER CENTER ONLY)
ALT: 5 U/L (ref 0–44)
AST: 12 U/L — ABNORMAL LOW (ref 15–41)
Albumin: 3.6 g/dL (ref 3.5–5.0)
Alkaline Phosphatase: 76 U/L (ref 38–126)
Anion gap: 5 (ref 5–15)
BUN: 23 mg/dL (ref 8–23)
CO2: 27 mmol/L (ref 22–32)
Calcium: 8.7 mg/dL — ABNORMAL LOW (ref 8.9–10.3)
Chloride: 106 mmol/L (ref 98–111)
Creatinine: 0.82 mg/dL (ref 0.61–1.24)
GFR, Estimated: >60 mL/min (ref >60)
Glucose, Bld: 111 mg/dL — ABNORMAL HIGH (ref 70–99)
Potassium: 4.1 mmol/L (ref 3.5–5.1)
Sodium: 138 mmol/L (ref 135–145)
Total Bilirubin: 0.4 mg/dL (ref 0.0–1.2)
Total Protein: 7.2 g/dL (ref 6.5–8.1)

## 2024-01-11 LAB — TSH: TSH: 0.965 u[IU]/mL (ref 0.350–4.500)

## 2024-01-11 LAB — CBC WITH DIFFERENTIAL (CANCER CENTER ONLY)
Abs Immature Granulocytes: 0.02 10*3/uL (ref 0.00–0.07)
Basophils Absolute: 0 10*3/uL (ref 0.0–0.1)
Basophils Relative: 0 %
Eosinophils Absolute: 0.2 10*3/uL (ref 0.0–0.5)
Eosinophils Relative: 4 %
HCT: 36.4 % — ABNORMAL LOW (ref 39.0–52.0)
Hemoglobin: 11.1 g/dL — ABNORMAL LOW (ref 13.0–17.0)
Immature Granulocytes: 0 %
Lymphocytes Relative: 8 %
Lymphs Abs: 0.6 10*3/uL — ABNORMAL LOW (ref 0.7–4.0)
MCH: 28.9 pg (ref 26.0–34.0)
MCHC: 30.5 g/dL (ref 30.0–36.0)
MCV: 94.8 fL (ref 80.0–100.0)
Monocytes Absolute: 0.8 10*3/uL (ref 0.1–1.0)
Monocytes Relative: 12 %
Neutro Abs: 5.1 10*3/uL (ref 1.7–7.7)
Neutrophils Relative %: 76 %
Platelet Count: 213 10*3/uL (ref 150–400)
RBC: 3.84 MIL/uL — ABNORMAL LOW (ref 4.22–5.81)
RDW: 12.8 % (ref 11.5–15.5)
WBC Count: 6.8 10*3/uL (ref 4.0–10.5)
nRBC: 0 % (ref 0.0–0.2)

## 2024-01-11 MED ORDER — SODIUM CHLORIDE 0.9 % IV SOLN: Freq: Once | INTRAVENOUS | Status: AC

## 2024-01-11 MED ORDER — NIVOLUMAB CHEMO INJECTION 100 MG/10ML
480.0000 mg | Freq: Once | INTRAVENOUS | Status: AC
  Administered 2024-01-11: 480 mg via INTRAVENOUS
  Filled 2024-01-11: qty 48

## 2024-01-11 NOTE — Patient Instructions (Signed)
[facility] - A DEPT OF [facility]  Discharge Instructions: Thank you for choosing [facility] to provide your oncology and hematology care.   If you have a lab appointment with the Cancer Center, please go directly to the Cancer Center and check in at the registration area.   Wear comfortable clothing and clothing appropriate for easy access to any Portacath or PICC line.   We strive to give you quality time with your provider. You may need to reschedule your appointment if you arrive late (15 or more minutes).  Arriving late affects you and other patients whose appointments are after yours.  Also, if you miss three or more appointments without notifying the office, you may be dismissed from the clinic at the provider's discretion.      For prescription refill requests, have your pharmacy contact our office and allow 72 hours for refills to be completed.    Today you received the following chemotherapy and/or immunotherapy agents nivolumab      To help prevent nausea and vomiting after your treatment, we encourage you to take your nausea medication as directed.  BELOW ARE SYMPTOMS THAT SHOULD BE REPORTED IMMEDIATELY: *FEVER GREATER THAN 100.4 F (38 C) OR HIGHER *CHILLS OR SWEATING *NAUSEA AND VOMITING THAT IS NOT CONTROLLED WITH YOUR NAUSEA MEDICATION *UNUSUAL SHORTNESS OF BREATH *UNUSUAL BRUISING OR BLEEDING *URINARY PROBLEMS (pain or burning when urinating, or frequent urination) *BOWEL PROBLEMS (unusual diarrhea, constipation, pain near the anus) TENDERNESS IN MOUTH AND THROAT WITH OR WITHOUT PRESENCE OF ULCERS (sore throat, sores in mouth, or a toothache) UNUSUAL RASH, SWELLING OR PAIN  UNUSUAL VAGINAL DISCHARGE OR ITCHING   Items with * indicate a potential emergency and should be followed up as soon as possible or go to the Emergency Department if any problems should occur.  Please show the CHEMOTHERAPY ALERT CARD or IMMUNOTHERAPY  ALERT CARD at check-in to the Emergency Department and triage nurse.  Should you have questions after your visit or need to cancel or reschedule your appointment, please contact [facility]  Dept: [phone]  and follow the prompts.  Office hours are 8:00 a.m. to 4:30 p.m. Monday - Friday. Please note that voicemails left after 4:00 p.m. may not be returned until the following business day.  We are closed weekends and major holidays. You have access to a nurse at all times for urgent questions. Please call the main number to the clinic Dept: [phone] and follow the prompts.   For any non-urgent questions, you may also contact your provider using MyChart. We now offer e-Visits for anyone 28 and older to request care online for non-urgent symptoms. For details visit [email or web address].   Also download the MyChart app! Go to the app store, search "MyChart", open the app, select [place], and log in with your MyChart username and password.

## 2024-01-12 LAB — T4: T4, Total: 4.9 ug/dL (ref 4.5–12.0)

## 2024-01-13 ENCOUNTER — Other Ambulatory Visit: Payer: Self-pay

## 2024-01-24 ENCOUNTER — Ambulatory Visit: Payer: Medicare PPO | Admitting: Internal Medicine

## 2024-01-24 ENCOUNTER — Other Ambulatory Visit: Payer: Self-pay

## 2024-01-24 ENCOUNTER — Ambulatory Visit: Payer: Medicare PPO

## 2024-01-24 ENCOUNTER — Other Ambulatory Visit: Payer: Medicare PPO

## 2024-02-07 ENCOUNTER — Inpatient Hospital Stay (HOSPITAL_BASED_OUTPATIENT_CLINIC_OR_DEPARTMENT_OTHER): Payer: Medicare PPO | Admitting: Internal Medicine

## 2024-02-07 ENCOUNTER — Inpatient Hospital Stay: Payer: Medicare PPO | Attending: Oncology

## 2024-02-07 ENCOUNTER — Inpatient Hospital Stay: Payer: Medicare PPO

## 2024-02-07 VITALS — BP 124/64 | HR 63 | Temp 98.3°F | Resp 17 | Ht 61.0 in | Wt 120.5 lb

## 2024-02-07 DIAGNOSIS — C649 Malignant neoplasm of unspecified kidney, except renal pelvis: Secondary | ICD-10-CM | POA: Insufficient documentation

## 2024-02-07 DIAGNOSIS — Z5112 Encounter for antineoplastic immunotherapy: Secondary | ICD-10-CM | POA: Diagnosis not present

## 2024-02-07 DIAGNOSIS — Z79899 Other long term (current) drug therapy: Secondary | ICD-10-CM | POA: Insufficient documentation

## 2024-02-07 DIAGNOSIS — C642 Malignant neoplasm of left kidney, except renal pelvis: Secondary | ICD-10-CM

## 2024-02-07 LAB — CBC WITH DIFFERENTIAL (CANCER CENTER ONLY)
Abs Immature Granulocytes: 0.01 10*3/uL (ref 0.00–0.07)
Basophils Absolute: 0 10*3/uL (ref 0.0–0.1)
Basophils Relative: 0 %
Eosinophils Absolute: 0.2 10*3/uL (ref 0.0–0.5)
Eosinophils Relative: 4 %
HCT: 35.6 % — ABNORMAL LOW (ref 39.0–52.0)
Hemoglobin: 11.2 g/dL — ABNORMAL LOW (ref 13.0–17.0)
Immature Granulocytes: 0 %
Lymphocytes Relative: 16 %
Lymphs Abs: 1 10*3/uL (ref 0.7–4.0)
MCH: 29.5 pg (ref 26.0–34.0)
MCHC: 31.5 g/dL (ref 30.0–36.0)
MCV: 93.7 fL (ref 80.0–100.0)
Monocytes Absolute: 0.7 10*3/uL (ref 0.1–1.0)
Monocytes Relative: 11 %
Neutro Abs: 4.1 10*3/uL (ref 1.7–7.7)
Neutrophils Relative %: 69 %
Platelet Count: 206 10*3/uL (ref 150–400)
RBC: 3.8 MIL/uL — ABNORMAL LOW (ref 4.22–5.81)
RDW: 12.3 % (ref 11.5–15.5)
WBC Count: 6 10*3/uL (ref 4.0–10.5)
nRBC: 0 % (ref 0.0–0.2)

## 2024-02-07 LAB — CMP (CANCER CENTER ONLY)
ALT: 9 U/L (ref 0–44)
AST: 12 U/L — ABNORMAL LOW (ref 15–41)
Albumin: 3.8 g/dL (ref 3.5–5.0)
Alkaline Phosphatase: 83 U/L (ref 38–126)
Anion gap: 5 (ref 5–15)
BUN: 26 mg/dL — ABNORMAL HIGH (ref 8–23)
CO2: 25 mmol/L (ref 22–32)
Calcium: 8.7 mg/dL — ABNORMAL LOW (ref 8.9–10.3)
Chloride: 107 mmol/L (ref 98–111)
Creatinine: 0.88 mg/dL (ref 0.61–1.24)
GFR, Estimated: >60 mL/min (ref >60)
Glucose, Bld: 134 mg/dL — ABNORMAL HIGH (ref 70–99)
Potassium: 4.3 mmol/L (ref 3.5–5.1)
Sodium: 137 mmol/L (ref 135–145)
Total Bilirubin: 0.4 mg/dL (ref 0.0–1.2)
Total Protein: 7.1 g/dL (ref 6.5–8.1)

## 2024-02-07 LAB — TSH: TSH: 0.759 u[IU]/mL (ref 0.350–4.500)

## 2024-02-07 MED ORDER — SODIUM CHLORIDE 0.9 % IV SOLN: Freq: Once | INTRAVENOUS | Status: AC

## 2024-02-07 MED ORDER — SODIUM CHLORIDE 0.9 % IV SOLN
480.0000 mg | Freq: Once | INTRAVENOUS | Status: AC
Start: 2024-02-07 — End: 2024-02-07
  Administered 2024-02-07: 480 mg via INTRAVENOUS
  Filled 2024-02-07: qty 48

## 2024-02-07 NOTE — Patient Instructions (Signed)
 [facility] - A DEPT OF [facility]  Discharge Instructions: Thank you for choosing [facility] to provide your oncology and hematology care.   If you have a lab appointment with the Cancer Center, please go directly to the Cancer Center and check in at the registration area.   Wear comfortable clothing and clothing appropriate for easy access to any Portacath or PICC line.   We strive to give you quality time with your provider. You may need to reschedule your appointment if you arrive late (15 or more minutes).  Arriving late affects you and other patients whose appointments are after yours.  Also, if you miss three or more appointments without notifying the office, you may be dismissed from the clinic at the provider's discretion.      For prescription refill requests, have your pharmacy contact our office and allow 72 hours for refills to be completed.    Today you received the following chemotherapy and/or immunotherapy agents nivolumab      To help prevent nausea and vomiting after your treatment, we encourage you to take your nausea medication as directed.  BELOW ARE SYMPTOMS THAT SHOULD BE REPORTED IMMEDIATELY: *FEVER GREATER THAN 100.4 F (38 C) OR HIGHER *CHILLS OR SWEATING *NAUSEA AND VOMITING THAT IS NOT CONTROLLED WITH YOUR NAUSEA MEDICATION *UNUSUAL SHORTNESS OF BREATH *UNUSUAL BRUISING OR BLEEDING *URINARY PROBLEMS (pain or burning when urinating, or frequent urination) *BOWEL PROBLEMS (unusual diarrhea, constipation, pain near the anus) TENDERNESS IN MOUTH AND THROAT WITH OR WITHOUT PRESENCE OF ULCERS (sore throat, sores in mouth, or a toothache) UNUSUAL RASH, SWELLING OR PAIN  UNUSUAL VAGINAL DISCHARGE OR ITCHING   Items with * indicate a potential emergency and should be followed up as soon as possible or go to the Emergency Department if any problems should occur.  Please show the CHEMOTHERAPY ALERT CARD or IMMUNOTHERAPY  ALERT CARD at check-in to the Emergency Department and triage nurse.  Should you have questions after your visit or need to cancel or reschedule your appointment, please contact [facility]  Dept: [phone]  and follow the prompts.  Office hours are 8:00 a.m. to 4:30 p.m. Monday - Friday. Please note that voicemails left after 4:00 p.m. may not be returned until the following business day.  We are closed weekends and major holidays. You have access to a nurse at all times for urgent questions. Please call the main number to the clinic Dept: [phone] and follow the prompts.   For any non-urgent questions, you may also contact your provider using MyChart. We now offer e-Visits for anyone 28 and older to request care online for non-urgent symptoms. For details visit [email or web address].   Also download the MyChart app! Go to the app store, search "MyChart", open the app, select [place], and log in with your MyChart username and password.

## 2024-02-07 NOTE — Progress Notes (Signed)
[facility] [phone]   [phone]  OFFICE PROGRESS NOTE  System, Provider Not In No address on file  DIAGNOSIS: Stage IV intermediate risk clear-cell renal cell carcinoma diagnosed in September 2023 with abdominal adenopathy and pulmonary nodules.  PRIOR THERAPY:  1) Status post kidney biopsy on August 30, 2022 consistent with renal cell carcinoma. 2) Induction treatment with immunotherapy with ipilimumab 1 Mg/KG and nivolumab 3 mg/KG every 3 weeks started on September 21, 2022 status post 4 cycles with partial response.  CURRENT THERAPY: Maintenance treatment with nivolumab 480 Mg IV every 4 weeks first dose started December 14, 2022.  Status post 14 cycle of the maintenance therapy.  INTERVAL HISTORY: Anthony Sheppard 88 y.o. male returns to clinic today for follow-up visit accompanied by his god daughter Anthony Sheppard. Discussed the use of AI scribe software for clinical note transcription with the patient, who gave verbal consent to proceed.  History of Present Illness   Anthony Sheppard is an 88 year old male with stage four clear cell renal cell carcinoma who presents for follow-up of his treatment. He is accompanied by his goddaughter, Anthony Sheppard.  He was diagnosed with stage four clear cell renal cell carcinoma in September 2023. Initially, he started treatment with a combination of immunotherapy, specifically ipilimumab and nivolumab, for four cycles. Since the fifth cycle, he has been maintained on nivolumab every four weeks and has completed a total of eighteen cycles to date. He feels generally fine since his last visit four weeks ago.  He notes a new onset of decreased strength in his right hand, which he noticed on the same day he left the last appointment. He describes having more strength in his left hand compared to the right. No weakness in his legs, changes in vision, headaches, chest pain, or breathing issues.  His current medication  regimen includes nivolumab administered every four weeks.       MEDICAL HISTORY: Past Medical History:  Diagnosis Date   Diabetes mellitus    Enlarged prostate    Hypertension    Sleep apnea     ALLERGIES:  is allergic to aspirin.  MEDICATIONS:  Current Outpatient Medications  Medication Sig Dispense Refill   ADVIL 200 MG CAPS Take 400 mg by mouth every 6 (six) hours as needed (for mild pain or headaches).     ALEVE 220 MG tablet Take 220-440 mg by mouth 2 (two) times daily as needed (for mild pain or headaches).     amLODipine (NORVASC) 5 MG tablet Take 5 mg by mouth daily.     cetirizine (ZYRTEC) 10 MG tablet Take 10 mg by mouth at bedtime.     finasteride (PROSCAR) 5 MG tablet Take 5 mg by mouth at bedtime.     tamsulosin (FLOMAX) 0.4 MG CAPS capsule Take 0.4 mg by mouth at bedtime.      No current facility-administered medications for this visit.    SURGICAL HISTORY: No past surgical history on file.  REVIEW OF SYSTEMS:  A comprehensive review of systems was negative except for: Musculoskeletal: positive for muscle weakness   PHYSICAL EXAMINATION: General appearance: alert, cooperative, and no distress Head: Normocephalic, without obvious abnormality, atraumatic Neck: no adenopathy, no JVD, supple, symmetrical, trachea midline, and thyroid not enlarged, symmetric, no tenderness/mass/nodules Lymph nodes: Cervical, supraclavicular, and axillary nodes normal. Resp: clear to auscultation bilaterally Back: symmetric, no curvature. ROM normal. No CVA tenderness. Cardio: regular rate and rhythm, S1, S2 normal, no murmur, click, rub  or gallop GI: soft, non-tender; bowel sounds normal; no masses,  no organomegaly Extremities: extremities normal, atraumatic, no cyanosis or edema  ECOG PERFORMANCE STATUS: 1 - Symptomatic but completely ambulatory  Blood pressure 124/64, pulse 63, temperature 98.3 F (36.8 C), temperature source Temporal, resp. rate 17, height 5\' 1"  (1.549 m),  weight 120 lb 8 oz (54.7 kg), SpO2 100%.  LABORATORY DATA: Lab Results  Component Value Date   WBC 6.0 02/07/2024   HGB 11.2 (L) 02/07/2024   HCT 35.6 (L) 02/07/2024   MCV 93.7 02/07/2024   PLT 206 02/07/2024      Chemistry      Component Value Date/Time   NA 138 01/11/2024 0959   K 4.1 01/11/2024 0959   CL 106 01/11/2024 0959   CO2 27 01/11/2024 0959   BUN 23 01/11/2024 0959   CREATININE 0.82 01/11/2024 0959      Component Value Date/Time   CALCIUM 8.7 (L) 01/11/2024 0959   ALKPHOS 76 01/11/2024 0959   AST 12 (L) 01/11/2024 0959   ALT 5 01/11/2024 0959   BILITOT 0.4 01/11/2024 0959       RADIOGRAPHIC STUDIES: No results found.  ASSESSMENT AND PLAN: This is a very pleasant 88 years old African-American male with a stage IV intermediate risk clear-cell carcinoma diagnosed in September 2023 status post 4 cycles of induction treatment with immunotherapy with ipilimumab and nivolumab and starting from cycle #5 he is currently on maintenance treatment with nivolumab 480 mg IV every 4 weeks status post total of 18 cycles.   The patient has been tolerating this treatment fairly well with no concerning adverse effects except for mild weakness in his right hand.    Stage IV Clear Cell Renal Cell Carcinoma Diagnosed in September 2023. Currently on nivolumab every four weeks, completed 18 cycles. No new symptoms indicating cancer progression. Blood work is stable. Discussed monitoring for symptoms such as weakness, vision changes, or headaches, which could indicate stroke or brain metastasis. Emphasized need for immediate medical attention if symptoms worsen. - Continue nivolumab every four weeks - Monitor for new symptoms or changes in condition - Proceed with today's treatment  Right Hand Weakness New onset of decreased strength in the right hand since the last visit. No associated symptoms such as vision changes, headaches, or leg weakness. Differential diagnosis includes  potential stroke or brain metastasis. Current strength is not severely compromised. Discussed importance of monitoring and seeking immediate medical attention if symptoms worsen due to risk of stroke or brain metastasis. - Monitor for worsening symptoms - Advise immediate medical attention if symptoms worsen  General Health Maintenance No new issues reported. Denies chest pain, dyspnea, or abdominal pain. Physical exam unremarkable. - Routine follow-up in four weeks.   The patient was advised to call immediately if he has any concerning symptoms in the interval. The patient voices understanding of current disease status and treatment options and is in agreement with the current care plan.  All questions were answered. The patient knows to call the clinic with any problems, questions or concerns. We can certainly see the patient much sooner if necessary.  The total time spent in the appointment was 20 minutes.  Disclaimer: This note was dictated with voice recognition software. Similar sounding words can inadvertently be transcribed and may not be corrected upon review.

## 2024-02-08 LAB — T4: T4, Total: 5.3 ug/dL (ref 4.5–12.0)

## 2024-02-16 ENCOUNTER — Telehealth: Payer: Self-pay | Admitting: Medical Oncology

## 2024-02-16 ENCOUNTER — Encounter: Payer: Self-pay | Admitting: Internal Medicine

## 2024-02-16 NOTE — Telephone Encounter (Signed)
 LVM on Anthony Sheppard's phone that I was returning her call.

## 2024-02-16 NOTE — Progress Notes (Signed)
 Patients's caregiver called regarding concern. Advised I would send message to provider rn for follow up. Provided main number to [facility] should she not receive call back.  She has my name and number for any additional financial questions or concerns.

## 2024-02-17 ENCOUNTER — Telehealth: Payer: Self-pay | Admitting: Internal Medicine

## 2024-02-17 ENCOUNTER — Encounter: Payer: Self-pay | Admitting: Internal Medicine

## 2024-02-17 ENCOUNTER — Telehealth: Payer: Self-pay

## 2024-02-17 NOTE — Telephone Encounter (Addendum)
 Runell Gess called back and said pt told her there was blood on his stool/in water. "Should we take him to ED"?. I told her yes he should probably go to get an evaluation.

## 2024-02-17 NOTE — Telephone Encounter (Signed)
 Spoke with Runell Gess in regards to patient. She states "someone from our office called and LVM about an appt so patient can be seen sooner than March 18 due to patient has blood in his stools and it started yesterday." She said patient feels fine. She denies patient dizziness and lightheaded. Informed Teressa she spoke with Diane earlier today and ED evaluation is recommended. Teressa voiced understanding.

## 2024-02-19 ENCOUNTER — Emergency Department (HOSPITAL_COMMUNITY)
Admission: EM | Admit: 2024-02-19 | Discharge: 2024-02-19 | Disposition: A | Discharge status: Home / Self Care | Attending: Emergency Medicine | Admitting: Emergency Medicine

## 2024-02-19 ENCOUNTER — Other Ambulatory Visit: Payer: Self-pay

## 2024-02-19 ENCOUNTER — Encounter (HOSPITAL_COMMUNITY): Payer: Self-pay

## 2024-02-19 DIAGNOSIS — K921 Melena: Secondary | ICD-10-CM | POA: Diagnosis not present

## 2024-02-19 DIAGNOSIS — K625 Hemorrhage of anus and rectum: Secondary | ICD-10-CM | POA: Diagnosis present

## 2024-02-19 DIAGNOSIS — R1032 Left lower quadrant pain: Secondary | ICD-10-CM | POA: Diagnosis not present

## 2024-02-19 LAB — BASIC METABOLIC PANEL
Anion gap: 6 (ref 5–15)
BUN: 22 mg/dL (ref 8–23)
CO2: 25 mmol/L (ref 22–32)
Calcium: 8.4 mg/dL — ABNORMAL LOW (ref 8.9–10.3)
Chloride: 105 mmol/L (ref 98–111)
Creatinine, Ser: 0.65 mg/dL (ref 0.61–1.24)
GFR, Estimated: >60 mL/min (ref >60)
Glucose, Bld: 84 mg/dL (ref 70–99)
Potassium: 4.5 mmol/L (ref 3.5–5.1)
Sodium: 136 mmol/L (ref 135–145)

## 2024-02-19 LAB — CBC
HCT: 36.1 % — ABNORMAL LOW (ref 39.0–52.0)
Hemoglobin: 11.2 g/dL — ABNORMAL LOW (ref 13.0–17.0)
MCH: 29.6 pg (ref 26.0–34.0)
MCHC: 31 g/dL (ref 30.0–36.0)
MCV: 95.5 fL (ref 80.0–100.0)
Platelets: 221 10*3/uL (ref 150–400)
RBC: 3.78 MIL/uL — ABNORMAL LOW (ref 4.22–5.81)
RDW: 12.3 % (ref 11.5–15.5)
WBC: 5.6 10*3/uL (ref 4.0–10.5)
nRBC: 0 % (ref 0.0–0.2)

## 2024-02-19 MED ORDER — AMOXICILLIN-POT CLAVULANATE 875-125 MG PO TABS: 1.0000 | ORAL_TABLET | Freq: Two times a day (BID) | ORAL | 0 refills | Status: AC

## 2024-02-19 NOTE — ED Triage Notes (Signed)
**  [facility] patient-receiving chemo  Pt reports:  Bloody stools Happened 5 days ago Bright red blood 2 episodes  Minimal pain No more incidents since then "I feel like my kidney is being squeezed." Denies pain Intermittent sensation Ongoing since before cancer dx More concerned today due to bloody stools earlier in the week

## 2024-02-19 NOTE — ED Provider Notes (Signed)
 [place] EMERGENCY DEPARTMENT AT [facility] Provider Note   CSN: [id number] Arrival date & time: 02/19/24  1352     History  Chief Complaint  Patient presents with   Rectal Bleeding   Flank Pain    Anthony Sheppard is a 88 y.o. male presented ED with painless blood in his stool ongoing about 4 to 5 days.  Reports is 1-2 regular bowel movements a day has noted some bright red blood in his stool.  He denies any pain in the abdomen.  Denies nausea or vomiting.  Denies blood thinner use or prior issues of GI bleeding.  He thinks may have had a colonoscopy over 10 years ago but cannot recall the specific details.  He denies lightheadedness or shortness of breath.  He says he came in because he was told to come in by triage nursing line.  Hgb was 11.2 (baseline 10-11) on 02/07/24 at regular check per external records  HPI     Home Medications Prior to Admission medications   Medication Sig Start Date End Date Taking? Authorizing Provider  amoxicillin-clavulanate (AUGMENTIN) 875-125 MG tablet Take 1 tablet by mouth every 12 (twelve) hours for 7 days. 02/19/24 02/26/24 Yes Kelcey Wickstrom, Kermit Balo, MD  ADVIL 200 MG CAPS Take 400 mg by mouth every 6 (six) hours as needed (for mild pain or headaches).    [provider]  ALEVE 220 MG tablet Take 220-440 mg by mouth 2 (two) times daily as needed (for mild pain or headaches).    [provider]  amLODipine (NORVASC) 5 MG tablet Take 5 mg by mouth daily.    [provider]  cetirizine (ZYRTEC) 10 MG tablet Take 10 mg by mouth at bedtime. 01/11/22   [provider]  finasteride (PROSCAR) 5 MG tablet Take 5 mg by mouth at bedtime.    [provider]  tamsulosin (FLOMAX) 0.4 MG CAPS capsule Take 0.4 mg by mouth at bedtime.     [provider]      Allergies    Aspirin    Review of Systems   Review of Systems  Physical Exam Updated Vital Signs BP 135/68   Pulse 69   Temp 98.3 F  (36.8 C) (Oral)   Resp 18   Ht 5\' 1"  (1.549 m)   Wt 54.4 kg   SpO2 100%   BMI 22.67 kg/m  Physical Exam Constitutional:      General: He is not in acute distress. HENT:     Head: Normocephalic and atraumatic.  Eyes:     Conjunctiva/sclera: Conjunctivae normal.     Pupils: Pupils are equal, round, and reactive to light.  Cardiovascular:     Rate and Rhythm: Normal rate and regular rhythm.  Pulmonary:     Effort: Pulmonary effort is normal. No respiratory distress.  Abdominal:     General: There is no distension.     Tenderness: There is no abdominal tenderness.  Skin:    General: Skin is warm and dry.  Neurological:     General: No focal deficit present.     Mental Status: He is alert and oriented to person, place, and time. Mental status is at baseline.  Psychiatric:        Mood and Affect: Mood normal.        Behavior: Behavior normal.     ED Results / Procedures / Treatments   Labs (all labs ordered are listed, but only abnormal results are displayed) Labs Reviewed  BASIC METABOLIC PANEL - Abnormal; Notable for the following components:      Result Value   Calcium 8.4 (*)    All other components within normal limits  CBC - Abnormal; Notable for the following components:   RBC 3.78 (*)    Hemoglobin 11.2 (*)    HCT 36.1 (*)    All other components within normal limits  URINALYSIS, ROUTINE W REFLEX MICROSCOPIC    EKG None  Radiology No results found.  Procedures Procedures    Medications Ordered in ED Medications - No data to display  ED Course/ Medical Decision Making/ A&P                                 Medical Decision Making Amount and/or Complexity of Data Reviewed Labs: ordered.  Risk Prescription drug management.   This patient presents to the ED with concern for painless bleeding in stool. This involves an extensive number of treatment options, and is a complaint that carries with it a high risk of complications and morbidity.  The  differential diagnosis includes ocular bleed versus hemorrhoidal bleed versus other GI bleed versus other  External records from outside source obtained and reviewed including outpatient hemoglobin checks as noted in history above.  Patient did have CT chest abdomen pelvis performed at the end of December, approximately 2 months ago, which noted that he had duodenal diverticulum.  He is being treated with immunotherapy every 4 weeks for clear-cell renal cell carcinoma, diagnosed September 2023, with abdominal adenopathy and pulmonary nodules.  I ordered and personally interpreted labs.  The pertinent results include: Hemoglobin at baseline level of 11.2.  BMP unremarkable.  Test Considered: Very low suspicion for acute intra-abdominal infectious or surgical emergency.  I do not see an indication for CT angiogram or CT imaging of the abdomen.  Doubt mesenteric ischemia or AAA.  Doubt active GI hemorrhage.  The patient has been having symptoms for several days now and his hemoglobin is completely stable.  I suspect this is most likely a stable small diverticular bleed which will likely resolve on its own, and it would not require hospitalization at this time.  The patient and his family member at the bedside are quite comfortable with this plan.  He is not on anticoagulation.  The patient has some very mild vague discomfort in the left lower side of the abdomen.  There is a possibility this is a very mild early diverticulitis.  In which case, this condition likely would improve with symptomatic management and not requiring antibiotics, according to current guidelines.  However a watch and wait prescription for Augmentin was provided.  If begins having fevers or worsening lower abdominal pain he can start the Augmentin.  The patient and his family member verbalized understanding.   After the interventions noted above, I reevaluated the patient and found that they have: stayed the same -he remains  completely asymptomatic during his stay in the ED.  Disposition:  After consideration of the diagnostic results and the patients response to treatment, I feel that the patent would benefit from close outpatient follow-up         Final Clinical Impression(s) / ED Diagnoses Final diagnoses:  Blood in stool    Rx / DC Orders ED Discharge Orders          Ordered    amoxicillin-clavulanate (AUGMENTIN) 875-125 MG tablet  Every 12 hours  02/19/24 1544              Terald Sleeper, MD 02/19/24 2016

## 2024-02-19 NOTE — Discharge Instructions (Addendum)
 Please have your oncologist or primary care office recheck your hemoglobin or blood count in 1 week.  Typically these types of bleed resolve on their own.  However, if you begin having fevers, worsening pain in the left lower side of your abdomen, begin taking the antibiotic that I prescribed.  Try to avoid taking NSAID medicines such as ibuprofen, Aleve or Advil.

## 2024-02-20 ENCOUNTER — Telehealth: Payer: Self-pay

## 2024-02-20 NOTE — Transitions of Care (Post Inpatient/ED Visit) (Unsigned)
   02/20/2024  Name: Anthony Sheppard MRN: [id number] DOB: [birth year]  Today's TOC FU Call Status: Today's TOC FU Call Status:: Unsuccessful Call (1st Attempt) Unsuccessful Call (1st Attempt) Date: 02/20/24  Attempted to reach the patient regarding the most recent Inpatient/ED visit.  Follow Up Plan: Additional outreach attempts will be made to reach the patient to complete the Transitions of Care (Post Inpatient/ED visit) call.   Signature Karena Addison, LPN [facility] Nurse Health Advisor Direct Dial [phone]

## 2024-02-21 ENCOUNTER — Ambulatory Visit: Payer: Medicare PPO | Admitting: Internal Medicine

## 2024-02-21 ENCOUNTER — Ambulatory Visit: Payer: Medicare PPO

## 2024-02-21 ENCOUNTER — Other Ambulatory Visit: Payer: Medicare PPO

## 2024-02-21 ENCOUNTER — Encounter: Payer: Self-pay | Admitting: Internal Medicine

## 2024-02-21 NOTE — Transitions of Care (Post Inpatient/ED Visit) (Unsigned)
   02/21/2024  Name: Anthony Sheppard MRN: [id number] DOB: [birth year]  Today's TOC FU Call Status: Today's TOC FU Call Status:: Unsuccessful Call (2nd Attempt) Unsuccessful Call (1st Attempt) Date: 02/20/24 Unsuccessful Call (2nd Attempt) Date: 02/21/24  Attempted to reach the patient regarding the most recent Inpatient/ED visit.  Follow Up Plan: Additional outreach attempts will be made to reach the patient to complete the Transitions of Care (Post Inpatient/ED visit) call.   Signature Karena Addison, LPN [facility] Nurse Health Advisor Direct Dial [phone]

## 2024-02-22 NOTE — Transitions of Care (Post Inpatient/ED Visit) (Signed)
   02/22/2024  Name: Anthony Sheppard MRN: [id number] DOB: 1936/10/27  Today's TOC FU Call Status: Today's TOC FU Call Status:: Unsuccessful Call (3rd Attempt) Unsuccessful Call (1st Attempt) Date: 02/20/24 Unsuccessful Call (2nd Attempt) Date: 02/21/24 Unsuccessful Call (3rd Attempt) Date: 02/22/24  Attempted to reach the patient regarding the most recent Inpatient/ED visit.  Follow Up Plan: No further outreach attempts will be made at this time. We have been unable to contact the patient.  Signature Karena Addison, LPN [facility] Nurse Health Advisor Direct Dial [phone]

## 2024-03-06 ENCOUNTER — Inpatient Hospital Stay (HOSPITAL_BASED_OUTPATIENT_CLINIC_OR_DEPARTMENT_OTHER): Payer: Medicare PPO | Admitting: Internal Medicine

## 2024-03-06 ENCOUNTER — Inpatient Hospital Stay: Payer: Medicare PPO | Attending: Oncology

## 2024-03-06 ENCOUNTER — Inpatient Hospital Stay: Payer: Medicare PPO

## 2024-03-06 VITALS — BP 140/65 | HR 61 | Temp 98.3°F | Resp 17 | Ht 61.0 in | Wt 120.1 lb

## 2024-03-06 DIAGNOSIS — K921 Melena: Secondary | ICD-10-CM | POA: Diagnosis not present

## 2024-03-06 DIAGNOSIS — K649 Unspecified hemorrhoids: Secondary | ICD-10-CM

## 2024-03-06 DIAGNOSIS — Z79899 Other long term (current) drug therapy: Secondary | ICD-10-CM

## 2024-03-06 DIAGNOSIS — C642 Malignant neoplasm of left kidney, except renal pelvis: Secondary | ICD-10-CM

## 2024-03-06 DIAGNOSIS — C649 Malignant neoplasm of unspecified kidney, except renal pelvis: Secondary | ICD-10-CM | POA: Insufficient documentation

## 2024-03-06 DIAGNOSIS — Z5112 Encounter for antineoplastic immunotherapy: Secondary | ICD-10-CM | POA: Insufficient documentation

## 2024-03-06 LAB — CBC WITH DIFFERENTIAL (CANCER CENTER ONLY)
Abs Immature Granulocytes: 0.01 10*3/uL (ref 0.00–0.07)
Basophils Absolute: 0 10*3/uL (ref 0.0–0.1)
Basophils Relative: 0 %
Eosinophils Absolute: 0.3 10*3/uL (ref 0.0–0.5)
Eosinophils Relative: 5 %
HCT: 36.1 % — ABNORMAL LOW (ref 39.0–52.0)
Hemoglobin: 11.6 g/dL — ABNORMAL LOW (ref 13.0–17.0)
Immature Granulocytes: 0 %
Lymphocytes Relative: 12 %
Lymphs Abs: 0.6 10*3/uL — ABNORMAL LOW (ref 0.7–4.0)
MCH: 29.8 pg (ref 26.0–34.0)
MCHC: 32.1 g/dL (ref 30.0–36.0)
MCV: 92.8 fL (ref 80.0–100.0)
Monocytes Absolute: 0.6 10*3/uL (ref 0.1–1.0)
Monocytes Relative: 11 %
Neutro Abs: 3.8 10*3/uL (ref 1.7–7.7)
Neutrophils Relative %: 72 %
Platelet Count: 209 10*3/uL (ref 150–400)
RBC: 3.89 MIL/uL — ABNORMAL LOW (ref 4.22–5.81)
RDW: 12 % (ref 11.5–15.5)
WBC Count: 5.2 10*3/uL (ref 4.0–10.5)
nRBC: 0 % (ref 0.0–0.2)

## 2024-03-06 LAB — CMP (CANCER CENTER ONLY)
ALT: 10 U/L (ref 0–44)
AST: 14 U/L — ABNORMAL LOW (ref 15–41)
Albumin: 3.9 g/dL (ref 3.5–5.0)
Alkaline Phosphatase: 84 U/L (ref 38–126)
Anion gap: 4 — ABNORMAL LOW (ref 5–15)
BUN: 25 mg/dL — ABNORMAL HIGH (ref 8–23)
CO2: 27 mmol/L (ref 22–32)
Calcium: 8.6 mg/dL — ABNORMAL LOW (ref 8.9–10.3)
Chloride: 106 mmol/L (ref 98–111)
Creatinine: 0.71 mg/dL (ref 0.61–1.24)
GFR, Estimated: >60 mL/min (ref >60)
Glucose, Bld: 95 mg/dL (ref 70–99)
Potassium: 4.4 mmol/L (ref 3.5–5.1)
Sodium: 137 mmol/L (ref 135–145)
Total Bilirubin: 0.4 mg/dL (ref 0.0–1.2)
Total Protein: 7.3 g/dL (ref 6.5–8.1)

## 2024-03-06 LAB — TSH: TSH: 1.043 u[IU]/mL (ref 0.350–4.500)

## 2024-03-06 MED ORDER — SODIUM CHLORIDE 0.9 % IV SOLN
480.0000 mg | Freq: Once | INTRAVENOUS | Status: AC
  Administered 2024-03-06: 480 mg via INTRAVENOUS
  Filled 2024-03-06: qty 48

## 2024-03-06 MED ORDER — SODIUM CHLORIDE 0.9 % IV SOLN: Freq: Once | INTRAVENOUS | Status: AC

## 2024-03-06 NOTE — Progress Notes (Signed)
 [facility] [phone]   Fax:(336) [phone]  OFFICE PROGRESS NOTE  Jarrett Soho, MD [address] [place] [address]  DIAGNOSIS: Stage IV left intermediate risk clear-cell renal cell carcinoma diagnosed in September 2023 with abdominal adenopathy and pulmonary nodules.  PRIOR THERAPY:  1) Status post left kidney biopsy on August 30, 2022 consistent with renal cell carcinoma. 2) Induction treatment with immunotherapy with ipilimumab 1 Mg/KG and nivolumab 3 mg/KG every 3 weeks started on September 21, 2022 status post 4 cycles with partial response.  CURRENT THERAPY: Maintenance treatment with nivolumab 480 Mg IV every 4 weeks first dose started December 14, 2022.  Status post 14 cycle of the maintenance therapy.  INTERVAL HISTORY: Anthony Sheppard 88 y.o. male returns to clinic today for follow-up visit accompanied by his god daughter Anthony Sheppard.  HeDiscussed the use of AI scribe software for clinical note transcription with the patient, who gave verbal consent to proceed.  History of Present Illness   Anthony Sheppard is an 88 year old male with stage four renal cell carcinoma who presents for ongoing treatment with nivolumab. He is accompanied by his goddaughter, Anthony [facility].  He was diagnosed with stage four renal cell carcinoma in September 2023. Initially, he received a combination of immunotherapy with ipilimumab and nivolumab for four cycles. Since then, he has been on maintenance nivolumab every four weeks and has completed a total of nineteen cycles, with today being his twentieth cycle.  Since his last visit four weeks ago, he experienced an episode of hematochezia. No constipation was noted during this event, and he is not taking any stool softeners. He recalls being informed of having a hemorrhoid, which may be related to the bleeding.       MEDICAL HISTORY: Past Medical History:  Diagnosis Date   Diabetes mellitus    Enlarged prostate     Hypertension    Sleep apnea     ALLERGIES:  is allergic to aspirin.  MEDICATIONS:  Current Outpatient Medications  Medication Sig Dispense Refill   ADVIL 200 MG CAPS Take 400 mg by mouth every 6 (six) hours as needed (for mild pain or headaches).     ALEVE 220 MG tablet Take 220-440 mg by mouth 2 (two) times daily as needed (for mild pain or headaches).     amLODipine (NORVASC) 5 MG tablet Take 5 mg by mouth daily.     cetirizine (ZYRTEC) 10 MG tablet Take 10 mg by mouth at bedtime.     finasteride (PROSCAR) 5 MG tablet Take 5 mg by mouth at bedtime.     tamsulosin (FLOMAX) 0.4 MG CAPS capsule Take 0.4 mg by mouth at bedtime.      No current facility-administered medications for this visit.    SURGICAL HISTORY: No past surgical history on file.  REVIEW OF SYSTEMS:  A comprehensive review of systems was negative except for: Constitutional: positive for fatigue Gastrointestinal: positive for melena Musculoskeletal: positive for muscle weakness   PHYSICAL EXAMINATION: General appearance: alert, cooperative, and no distress Head: Normocephalic, without obvious abnormality, atraumatic Neck: no adenopathy, no JVD, supple, symmetrical, trachea midline, and thyroid not enlarged, symmetric, no tenderness/mass/nodules Lymph nodes: Cervical, supraclavicular, and axillary nodes normal. Resp: clear to auscultation bilaterally Back: symmetric, no curvature. ROM normal. No CVA tenderness. Cardio: regular rate and rhythm, S1, S2 normal, no murmur, click, rub or gallop GI: soft, non-tender; bowel sounds normal; no masses,  no organomegaly Extremities: extremities normal, atraumatic, no cyanosis or edema  ECOG PERFORMANCE STATUS: 1 - Symptomatic but completely ambulatory  Blood pressure (!) 140/65, pulse 61, temperature 98.3 F (36.8 C), temperature source Temporal, resp. rate 17, height 5\' 1"  (1.549 m), weight 120 lb 1.6 oz (54.5 kg), SpO2 99%.  LABORATORY DATA: Lab Results  Component Value  Date   WBC 5.2 03/06/2024   HGB 11.6 (L) 03/06/2024   HCT 36.1 (L) 03/06/2024   MCV 92.8 03/06/2024   PLT 209 03/06/2024      Chemistry      Component Value Date/Time   NA 137 03/06/2024 1056   K 4.4 03/06/2024 1056   CL 106 03/06/2024 1056   CO2 27 03/06/2024 1056   BUN 25 (H) 03/06/2024 1056   CREATININE 0.71 03/06/2024 1056      Component Value Date/Time   CALCIUM 8.6 (L) 03/06/2024 1056   ALKPHOS 84 03/06/2024 1056   AST 14 (L) 03/06/2024 1056   ALT 10 03/06/2024 1056   BILITOT 0.4 03/06/2024 1056       RADIOGRAPHIC STUDIES: No results found.  ASSESSMENT AND PLAN: This is a very pleasant 88 years old African-American male with a stage IV intermediate risk clear-cell carcinoma diagnosed in September 2023 status post 4 cycles of induction treatment with immunotherapy with ipilimumab and nivolumab and starting from cycle #5 he is currently on maintenance treatment with nivolumab 480 mg IV every 4 weeks status post total of 19 cycles.   The patient has been tolerated consistent with midfoot    Stage IV renal cell carcinoma Diagnosed in September 2023, he is undergoing treatment with immunotherapy, initially with ipilimumab and nivolumab for four cycles, followed by maintenance nivolumab every four weeks. He has completed 19 cycles and is currently on the 20th cycle, which is well-tolerated. - Continue nivolumab every four weeks - Perform a scan one week prior to the next appointment  Hematochezia Reported an episode of hematochezia. He has hemorrhoids, a potential cause. He was not constipated at the time and is not using stool softeners. If bleeding recurs, he should consult his family doctor and possibly a gastroenterologist for further investigation. - Consult family doctor if bleeding recurs - Consider referral to a gastroenterologist if necessary   The patient was advised to call immediately if he has any concerning symptoms in the interval.  The patient voices  understanding of current disease status and treatment options and is in agreement with the current care plan.  All questions were answered. The patient knows to call the clinic with any problems, questions or concerns. We can certainly see the patient much sooner if necessary.  The total time spent in the appointment was 20 minutes.  Disclaimer: This note was dictated with voice recognition software. Similar sounding words can inadvertently be transcribed and may not be corrected upon review.

## 2024-03-07 ENCOUNTER — Other Ambulatory Visit: Payer: Self-pay

## 2024-03-08 ENCOUNTER — Other Ambulatory Visit: Payer: Self-pay

## 2024-03-08 LAB — T4: T4, Total: 4.9 ug/dL (ref 4.5–12.0)

## 2024-03-16 ENCOUNTER — Encounter: Payer: Self-pay | Admitting: Internal Medicine

## 2024-03-27 ENCOUNTER — Ambulatory Visit (HOSPITAL_COMMUNITY)
Admission: RE | Admit: 2024-03-27 | Discharge: 2024-03-27 | Disposition: A | Discharge status: Home / Self Care | Source: Ambulatory Visit | Attending: Internal Medicine | Admitting: Internal Medicine

## 2024-03-27 DIAGNOSIS — C642 Malignant neoplasm of left kidney, except renal pelvis: Secondary | ICD-10-CM | POA: Diagnosis not present

## 2024-03-27 DIAGNOSIS — E042 Nontoxic multinodular goiter: Secondary | ICD-10-CM | POA: Diagnosis not present

## 2024-03-27 DIAGNOSIS — N289 Disorder of kidney and ureter, unspecified: Secondary | ICD-10-CM | POA: Diagnosis not present

## 2024-03-27 DIAGNOSIS — K5900 Constipation, unspecified: Secondary | ICD-10-CM | POA: Diagnosis not present

## 2024-03-27 DIAGNOSIS — R918 Other nonspecific abnormal finding of lung field: Secondary | ICD-10-CM | POA: Diagnosis not present

## 2024-03-31 ENCOUNTER — Other Ambulatory Visit: Payer: Self-pay

## 2024-04-03 ENCOUNTER — Inpatient Hospital Stay: Payer: Medicare PPO | Attending: Oncology

## 2024-04-03 ENCOUNTER — Inpatient Hospital Stay: Payer: Medicare PPO

## 2024-04-03 ENCOUNTER — Inpatient Hospital Stay: Payer: Medicare PPO | Attending: Oncology | Admitting: Internal Medicine

## 2024-04-03 ENCOUNTER — Inpatient Hospital Stay

## 2024-04-03 VITALS — BP 101/57 | HR 59 | Resp 17

## 2024-04-03 VITALS — BP 132/64 | HR 60 | Temp 98.2°F | Resp 17 | Ht 61.0 in | Wt 122.8 lb

## 2024-04-03 DIAGNOSIS — C642 Malignant neoplasm of left kidney, except renal pelvis: Secondary | ICD-10-CM

## 2024-04-03 DIAGNOSIS — Z5112 Encounter for antineoplastic immunotherapy: Secondary | ICD-10-CM | POA: Diagnosis not present

## 2024-04-03 DIAGNOSIS — C649 Malignant neoplasm of unspecified kidney, except renal pelvis: Secondary | ICD-10-CM | POA: Diagnosis not present

## 2024-04-03 DIAGNOSIS — Z79899 Other long term (current) drug therapy: Secondary | ICD-10-CM | POA: Diagnosis not present

## 2024-04-03 LAB — CBC WITH DIFFERENTIAL (CANCER CENTER ONLY)
Abs Immature Granulocytes: 0.02 10*3/uL (ref 0.00–0.07)
Basophils Absolute: 0 10*3/uL (ref 0.0–0.1)
Basophils Relative: 0 %
Eosinophils Absolute: 0.2 10*3/uL (ref 0.0–0.5)
Eosinophils Relative: 4 %
HCT: 34.9 % — ABNORMAL LOW (ref 39.0–52.0)
Hemoglobin: 11.1 g/dL — ABNORMAL LOW (ref 13.0–17.0)
Immature Granulocytes: 0 %
Lymphocytes Relative: 15 %
Lymphs Abs: 0.8 10*3/uL (ref 0.7–4.0)
MCH: 28.9 pg (ref 26.0–34.0)
MCHC: 31.8 g/dL (ref 30.0–36.0)
MCV: 90.9 fL (ref 80.0–100.0)
Monocytes Absolute: 0.6 10*3/uL (ref 0.1–1.0)
Monocytes Relative: 12 %
Neutro Abs: 3.7 10*3/uL (ref 1.7–7.7)
Neutrophils Relative %: 69 %
Platelet Count: 223 10*3/uL (ref 150–400)
RBC: 3.84 MIL/uL — ABNORMAL LOW (ref 4.22–5.81)
RDW: 12.3 % (ref 11.5–15.5)
WBC Count: 5.4 10*3/uL (ref 4.0–10.5)
nRBC: 0 % (ref 0.0–0.2)

## 2024-04-03 LAB — CMP (CANCER CENTER ONLY)
ALT: 9 U/L (ref 0–44)
AST: 13 U/L — ABNORMAL LOW (ref 15–41)
Albumin: 3.7 g/dL (ref 3.5–5.0)
Alkaline Phosphatase: 80 U/L (ref 38–126)
Anion gap: 3 — ABNORMAL LOW (ref 5–15)
BUN: 20 mg/dL (ref 8–23)
CO2: 26 mmol/L (ref 22–32)
Calcium: 8.5 mg/dL — ABNORMAL LOW (ref 8.9–10.3)
Chloride: 107 mmol/L (ref 98–111)
Creatinine: 0.71 mg/dL (ref 0.61–1.24)
GFR, Estimated: >60 mL/min (ref >60)
Glucose, Bld: 90 mg/dL (ref 70–99)
Potassium: 4.4 mmol/L (ref 3.5–5.1)
Sodium: 136 mmol/L (ref 135–145)
Total Bilirubin: 0.6 mg/dL (ref 0.0–1.2)
Total Protein: 7.1 g/dL (ref 6.5–8.1)

## 2024-04-03 LAB — TSH: TSH: 0.641 u[IU]/mL (ref 0.350–4.500)

## 2024-04-03 MED ORDER — SODIUM CHLORIDE 0.9 % IV SOLN: Freq: Once | INTRAVENOUS | Status: AC

## 2024-04-03 MED ORDER — SODIUM CHLORIDE 0.9 % IV SOLN
480.0000 mg | Freq: Once | INTRAVENOUS | Status: AC
  Administered 2024-04-03: 480 mg via INTRAVENOUS
  Filled 2024-04-03: qty 48

## 2024-04-03 NOTE — Progress Notes (Signed)
 [facility] Telephone:([phone]   Fax:(336) [phone]  OFFICE PROGRESS NOTE  Jarrett Soho, MD [address] [place] [address]  DIAGNOSIS: Stage IV left intermediate risk clear-cell renal cell carcinoma diagnosed in September 2023 with abdominal adenopathy and pulmonary nodules.  PRIOR THERAPY:  1) Status post left kidney biopsy on August 30, 2022 consistent with renal cell carcinoma. 2) Induction treatment with immunotherapy with ipilimumab 1 Mg/KG and nivolumab 3 mg/KG every 3 weeks started on September 21, 2022 status post 4 cycles with partial response. 3) Maintenance treatment with nivolumab 480 Mg IV every 4 weeks first dose started December 14, 2022.  Status post 16 cycle of the maintenance therapy.  Last dose of treatment was on March 2025.  CURRENT THERAPY: Maintenance treatment with nivolumab 480 Mg IV every 4 weeks first dose started December 14, 2022.  Status post 16 cycle of the maintenance therapy.  Last dose of treatment was on March 2025.  INTERVAL HISTORY: Anthony Shepp[facility] 88 y.o. male returns to clinic today for follow-up visit accompanied by his god daughter Anthony Sheppard. Discussed the use of AI scribe software for clinical note transcription with the patient, who gave verbal consent to proceed.  History of Present Illness   Anthony [facility]pp[facility] is an 88 year old male with stage four left intermediate risk clear cell renal cell carcinoma who presents for evaluation with repeat CT scan.  Diagnosed with stage four left intermediate risk clear cell renal cell carcinoma in September 2023, he has been on maintenance treatment with nivolumab, receiving ATMG IV every four weeks, and has completed sixteen cycles.  He reports feeling fine with no current symptoms such as chest pain, breathing issues, nausea, vomiting, or diarrhea. He has completed almost two years of treatment and states that the treatment was not bothersome to him.  A repeat CT scan of the chest was  performed last week as part of his ongoing evaluation.       MEDICAL HISTORY: Past Medical History:  Diagnosis Date   Diabetes mellitus    Enlarged prostate    Hypertension    Sleep apnea     ALLERGIES:  is allergic to aspirin.  MEDICATIONS:  Current Outpatient Medications  Medication Sig Dispense Refill   ADVIL 200 MG CAPS Take 400 mg by mouth every 6 (six) hours as needed (for mild pain or headaches).     ALEVE 220 MG tablet Take 220-440 mg by mouth 2 (two) times daily as needed (for mild pain or headaches).     amLODipine (NORVASC) 5 MG tablet Take 5 mg by mouth daily.     cetirizine (ZYRTEC) 10 MG tablet Take 10 mg by mouth at bedtime.     finasteride (PROSCAR) 5 MG tablet Take 5 mg by mouth at bedtime.     tamsulosin (FLOMAX) 0.4 MG CAPS capsule Take 0.4 mg by mouth at bedtime.      No current facility-administered medications for this visit.    SURGICAL HISTORY: No past surgical history on file.  REVIEW OF SYSTEMS:  Constitutional: negative Eyes: negative Ears, nose, mouth, throat, and face: negative Respiratory: negative Cardiovascular: negative Gastrointestinal: negative Genitourinary:negative Integument/breast: negative Hematologic/lymphatic: negative Musculoskeletal:negative Neurological: negative Behavioral/Psych: negative Endocrine: negative Allergic/Immunologic: negative   PHYSICAL EXAMINATION: General appearance: alert, cooperative, and no distress Head: Normocephalic, without obvious abnormality, atraumatic Neck: no adenopathy, no JVD, supple, symmetrical, trachea midline, and thyroid not enlarged, symmetric, no tenderness/mass/nodules Lymph nodes: Cervical, supraclavicular, and axillary nodes normal. Resp: clear to  auscultation bilaterally Back: symmetric, no curvature. ROM normal. No CVA tenderness. Cardio: regular rate and rhythm, S1, S2 normal, no murmur, click, rub or gallop GI: soft, non-tender; bowel sounds normal; no masses,  no  organomegaly Extremities: extremities normal, atraumatic, no cyanosis or edema Neurologic: Alert and oriented X 3, normal strength and tone. Normal symmetric reflexes. Normal coordination and gait  ECOG PERFORMANCE STATUS: 1 - Symptomatic but completely ambulatory  Blood pressure 132/64, pulse 60, temperature 98.2 F (36.8 C), temperature source Temporal, resp. rate 17, height 5\' 1"  (1.549 m), weight 122 lb 12.8 oz (55.7 kg), SpO2 100%.  LABORATORY DATA: Lab Results  Component Value Date   WBC 5.4 04/03/2024   HGB 11.1 (L) 04/03/2024   HCT 34.9 (L) 04/03/2024   MCV 90.9 04/03/2024   PLT 223 04/03/2024      Chemistry      Component Value Date/Time   NA 136 04/03/2024 1121   K 4.4 04/03/2024 1121   CL 107 04/03/2024 1121   CO2 26 04/03/2024 1121   BUN 20 04/03/2024 1121   CREATININE 0.71 04/03/2024 1121      Component Value Date/Time   CALCIUM 8.5 (L) 04/03/2024 1121   ALKPHOS 80 04/03/2024 1121   AST 13 (L) 04/03/2024 1121   ALT 9 04/03/2024 1121   BILITOT 0.6 04/03/2024 1121       RADIOGRAPHIC STUDIES: CT Chest Wo Contrast Result Date: 04/03/2024 CLINICAL DATA:  Renal cell carcinoma. Staging. * Tracking Code: BO * EXAM: CT CHEST WITHOUT CONTRAST TECHNIQUE: Multidetector CT imaging of the chest was performed following the standard protocol without IV contrast. RADIATION DOSE REDUCTION: This exam was performed according to the departmental dose-optimization program which includes automated exposure control, adjustment of the mA and/or kV according to patient size and/or use of iterative reconstruction technique. COMPARISON:  12/19/2023 FINDINGS: Cardiovascular: The heart size is normal. No substantial pericardial effusion. Coronary artery calcification is evident. Mild atherosclerotic calcification is noted in the wall of the thoracic aorta. Mediastinum/Nodes: No mediastinal lymphadenopathy. Multinodular thyroid large mint again noted. Recommend thyroid ultrasound (ref: J Am  Coll Radiol. 2015 Feb;12(2): 143-50).No evidence for gross hilar lymphadenopathy although assessment is limited by the lack of intravenous contrast on the current study. The esophagus has normal imaging features. There is no axillary lymphadenopathy. Lungs/Pleura: Stable 3 mm posterior left upper lobe paraspinal nodule on 36/7. 4 mm left perifissural nodule on 89/7 is similar to prior. No new suspicious pulmonary nodule or mass. No focal airspace consolidation. No pleural effusion. Upper Abdomen: 2.4 cm hypodensity in the posterior right liver is stable. Additional tiny hypoattenuating foci in the liver parenchyma are too small to characterize but unchanged in the interval. No adrenal nodule or mass. Patient has a history of left upper pole renal lesion, not well demonstrated on this noncontrast study. Musculoskeletal: No worrisome lytic or sclerotic osseous abnormality. Sclerosis and compression deformity at T12 is stable. IMPRESSION: 1. Stable exam. No new or progressive findings to suggest metastatic disease in the chest. 2. Stable tiny left lung nodules. 3. Stable 2.4 cm hypodensity in the posterior right liver. Additional tiny hypoattenuating foci in the liver parenchyma are too small to characterize but unchanged in the interval. Although incompletely characterized, these findings are likely benign. 4. Multinodular thyroid. Recommend thyroid ultrasound. 5.  Aortic Atherosclerois (ICD10-170.0) Electronically Signed   By: Donnal Fusi M.D.   On: 04/03/2024 07:49   CT ABDOMEN PELVIS WO CONTRAST Result Date: 03/28/2024 CLINICAL DATA:  Kidney cancer with metastases. EXAM:  CT ABDOMEN AND PELVIS WITHOUT CONTRAST TECHNIQUE: Multidetector CT imaging of the abdomen and pelvis was performed following the standard protocol without IV contrast. RADIATION DOSE REDUCTION: This exam was performed according to the departmental dose-optimization program which includes automated exposure control, adjustment of the mA and/or  kV according to patient size and/or use of iterative reconstruction technique. COMPARISON:  03/27/2024. FINDINGS: Lower chest: Linear bibasilar subsegmental atelectasis or scarring is stable. No pericardial or pleural effusions. Hepatobiliary: Hypodensity in the liver inferior right lobe measures 2.5 cm is stable consistent with cyst. Another stable lesion in the medial right lobe measures 1.1 cm. In the right dome there are 2 cm sized hypodensities. These are all stable. Unremarkable appearance of gallbladder. Pancreas: Unremarkable. No pancreatic ductal dilatation or surrounding inflammatory changes. Spleen: Normal in size without focal abnormality. Adrenals/Urinary Tract: No adrenal lesions. Exophytic lesion upper pole left kidney measuring 3.2 cm. The margins of the lesion difficult to determine on noncontrast evaluation. There is no definite interval change compared to the prior study. No nephrolithiasis or hydronephrosis. Urinary bladder wall thickening consistent with hypertrophy likely basis of chronic outlet obstruction. Stomach/Bowel: No bowel dilatation to suggest obstruction. Increased stool consistent with constipation. Unremarkable appendix. Vascular/Lymphatic: Aortic atherosclerosis. No enlarged abdominal or pelvic lymph nodes. Reproductive: Grossly enlarged prostate, 7.4 x 7.4 x 6.0 cm. Other: Small fat containing right inguinal hernia. No abdominopelvic ascites. Musculoskeletal: No acute or significant osseous findings. IMPRESSION: 1. Stable hepatic hypodensities consistent with cysts. 2. Grossly stable left renal lesion. 3. Prostatomegaly. 4. Constipation. 5. No acute abdominal or pelvic pathology identified. 6. Aortic atherosclerosis. Electronically Signed   By: Layla Maw M.D.   On: 03/28/2024 10:29    ASSESSMENT AND PLAN: This is a very pleasant 88 years old African-American male with a stage IV intermediate risk clear-cell carcinoma diagnosed in September 2023 status post 4 cycles of  induction treatment with immunotherapy with ipilimumab and nivolumab and starting from cycle #5 he is currently on maintenance treatment with nivolumab 480 mg IV every 4 weeks status post total of 20 cycles.      Stage IV left intermediate risk clear cell renal cell carcinoma Hal Neer has stage IV left intermediate risk clear cell renal cell carcinoma diagnosed in September 2023. He has completed 20 cycles of maintenance nivolumab over nearly two years. Recent CT scan shows no evidence of cancer progression, indicating well-managed disease. He is asymptomatic with no chest pain, dyspnea, nausea, vomiting, or diarrhea. Given the well-managed disease and his good response to treatment His scan showed no concerning findings for disease progression. I recommended for him to proceed with cycle #20 one of his treatment today as planned.  We will discontinue his treatment with nivolumab after completing 2 years of treatment.   The patient was advised to call immediately if he has any other concerning symptoms in the interval. The patient voices understanding of current disease status and treatment options and is in agreement with the current care plan.  All questions were answered. The patient knows to call the clinic with any problems, questions or concerns. We can certainly see the patient much sooner if necessary.  The total time spent in the appointment was 30 minutes.  Disclaimer: This note was dictated with voice recognition software. Similar sounding words can inadvertently be transcribed and may not be corrected upon review.

## 2024-04-03 NOTE — Patient Instructions (Signed)
 [facility] - A DEPT OF [facility]  Discharge Instructions: Thank you for choosing [facility] to provide your oncology and hematology care.   If you have a lab appointment with the Cancer Center, please go directly to the Cancer Center and check in at the registration area.   Wear comfortable clothing and clothing appropriate for easy access to any Portacath or PICC line.   We strive to give you quality time with your provider. You may need to reschedule your appointment if you arrive late (15 or more minutes).  Arriving late affects you and other patients whose appointments are after yours.  Also, if you miss three or more appointments without notifying the office, you may be dismissed from the clinic at the provider's discretion.      For prescription refill requests, have your pharmacy contact our office and allow 72 hours for refills to be completed.    Today you received the following chemotherapy and/or immunotherapy agents: Opdivo      To help prevent nausea and vomiting after your treatment, we encourage you to take your nausea medication as directed.  BELOW ARE SYMPTOMS THAT SHOULD BE REPORTED IMMEDIATELY: *FEVER GREATER THAN 100.4 F (38 C) OR HIGHER *CHILLS OR SWEATING *NAUSEA AND VOMITING THAT IS NOT CONTROLLED WITH YOUR NAUSEA MEDICATION *UNUSUAL SHORTNESS OF BREATH *UNUSUAL BRUISING OR BLEEDING *URINARY PROBLEMS (pain or burning when urinating, or frequent urination) *BOWEL PROBLEMS (unusual diarrhea, constipation, pain near the anus) TENDERNESS IN MOUTH AND THROAT WITH OR WITHOUT PRESENCE OF ULCERS (sore throat, sores in mouth, or a toothache) UNUSUAL RASH, SWELLING OR PAIN  UNUSUAL VAGINAL DISCHARGE OR ITCHING   Items with * indicate a potential emergency and should be followed up as soon as possible or go to the Emergency Department if any problems should occur.  Please show the CHEMOTHERAPY ALERT CARD or IMMUNOTHERAPY  ALERT CARD at check-in to the Emergency Department and triage nurse.  Should you have questions after your visit or need to cancel or reschedule your appointment, please contact [facility] - A DEPT OF [facility]  Dept: [phone]  and follow the prompts.  Office hours are 8:00 a.m. to 4:30 p.m. Monday - Friday. Please note that voicemails left after 4:00 p.m. may not be returned until the following business day.  We are closed weekends and major holidays. You have access to a nurse at all times for urgent questions. Please call the main number to the clinic Dept: [phone] and follow the prompts.   For any non-urgent questions, you may also contact your provider using MyChart. We now offer e-Visits for anyone 57 and older to request care online for non-urgent symptoms. For details visit [email or web address].   Also download the MyChart app! Go to the app store, search "MyChart", open the app, select , and log in with your MyChart username and password.

## 2024-04-04 ENCOUNTER — Telehealth: Payer: Self-pay

## 2024-04-04 LAB — T4: T4, Total: 4.9 ug/dL (ref 4.5–12.0)

## 2024-04-04 NOTE — Telephone Encounter (Signed)
 Teressa called and LVM about patients appts.  Spoke with Lynn Sark and confirmed patients appts and informed her that a calandar can be printed out at his next appt in May.  She voiced understanding.

## 2024-04-05 ENCOUNTER — Other Ambulatory Visit: Payer: Self-pay

## 2024-04-11 ENCOUNTER — Other Ambulatory Visit: Payer: Self-pay

## 2024-04-30 ENCOUNTER — Ambulatory Visit (INDEPENDENT_AMBULATORY_CARE_PROVIDER_SITE_OTHER): Admitting: Neurology

## 2024-04-30 ENCOUNTER — Encounter: Payer: Self-pay | Admitting: Neurology

## 2024-04-30 VITALS — BP 98/56 | HR 58 | Ht 61.0 in | Wt 121.0 lb

## 2024-04-30 DIAGNOSIS — G4719 Other hypersomnia: Secondary | ICD-10-CM | POA: Diagnosis not present

## 2024-04-30 DIAGNOSIS — C642 Malignant neoplasm of left kidney, except renal pelvis: Secondary | ICD-10-CM

## 2024-04-30 DIAGNOSIS — Z8669 Personal history of other diseases of the nervous system and sense organs: Secondary | ICD-10-CM | POA: Insufficient documentation

## 2024-04-30 DIAGNOSIS — R5383 Other fatigue: Secondary | ICD-10-CM | POA: Insufficient documentation

## 2024-04-30 DIAGNOSIS — Z87898 Personal history of other specified conditions: Secondary | ICD-10-CM | POA: Insufficient documentation

## 2024-04-30 DIAGNOSIS — T451X5A Adverse effect of antineoplastic and immunosuppressive drugs, initial encounter: Secondary | ICD-10-CM

## 2024-04-30 NOTE — Progress Notes (Signed)
 SLEEP MEDICINE CLINIC    Provider:  Neomia Banner, MD  Primary Care Physician:  Creasie Doctor, MD [address] [place] [address]     Referring Provider: Creasie Doctor, Md [address] [place],  [place] [address]          Chief Complaint according to patient   Patient presents with:     New Patient (Initial Visit)           HISTORY OF PRESENT ILLNESS:  Anthony Sheppard is a 88 y.o. male patient who is seen upon referral on 04/30/2024 from [place] provider Dr Nolon Baxter.   Chief concern according to patient :  " I had a sleep study 13 years ago and one before the pandemic, about 8 years ago  at the [place]- each time dx with OSA and given a CPAP but he didn't use it- he didn't know how".  " I fall asleep easily, no problem, but I have been told I have  apnea - I don't feel  that I wake up a lot.  Just recently I started to go 3-4 times to the bathroom, only in the last few months. "  I have the pleasure of seeing  a Timmothy Foots , Mr Trindon Stimac, on 04/30/24 a right-handed male with a possibly untreated OSA sleep disorder.  He served in the [employer], [employer] - over 22 years-  [place], [place]. [place], and retired 1984.  He was exposed to Agent Orange     Sleep relevant medical history: Nocturia  3-4 times a week ,   No ENT surgery,  Family medical /sleep history: no other family member  with OSA.  Mother died at age 92 after a fall.  Father reached age 74 plus. A brother died being shot,  sister  died in her 50s of cancer.     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 One daughter lives in [place] .  Family status is separated for a long time.  The patient currently works 40 hours a week.  Tobacco use: none .  ETOH use ; none , Caffeine intake in form of Coffee( 4-8/ day )  Soda( /) Tea ( /) or energy drinks Exercise in form  of walking at work. .      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  The patient's dinner time is between 3-4  PM. The patient goes to bed at 12 PM and continues to sleep for 5-6 hours, wakes for several  bathroom breaks, The preferred sleep position is unknown, with the support of 1 pillow-  Dreams are reportedly rare.  The patient wakes up spontaneously  at 5-, 6 AM is the usual rise time. He reports feeling refreshed or restored in AM, with symptoms such as dry mouth,  Naps are taken infrequently, lasting from 15 to 25 minutes and are refreshing .   Review of Systems: Out of a complete 14 system review, the patient complains of only the following symptoms, and all other reviewed systems are negative.:  Fatigue, sleepiness , snoring, fragmented sleep, Nocturia yes-   How likely are you to doze in the following situations: 0 = not likely, 1 = slight chance, 2 = moderate chance, 3 = high chance  Sitting and Reading? Watching Television? Sitting inactive in a public place (theater or meeting)? As a passenger in a car for an hour without a break? Lying down in the afternoon when circumstances permit? Sitting and talking to someone? Sitting quietly after lunch without alcohol? In a car, while stopped for a few minutes in traffic?   Total = 22/ 24 points   FSS endorsed at 13/ 63 points.   Social History   Socioeconomic History   Marital status: Legally Separated    Spouse name: Not on file   Number of children: Not on file   Years of education: Not on file   Highest education level: Not on file  Occupational History   Not on file  Tobacco Use   Smoking status: Never   Smokeless tobacco: Never  Vaping Use   Vaping status: Never Used  Substance and Sexual Activity   Alcohol use: No   Drug use: No   Sexual activity: Not on file  Other Topics Concern   Not on file  Social History Narrative   Not on file   Social Drivers of Health   Financial  Resource Strain: Low Risk  (09/13/2022)   Overall Financial Resource Strain (CARDIA)    Difficulty of Paying Living Expenses: Not hard at all  Food Insecurity: No Food Insecurity (09/13/2022)   Hunger Vital Sign    Worried About Running Out of Food in the Last Year: Never true    Ran Out of Food in the Last Year: Never true  Transportation Needs: No Transportation Needs (09/13/2022)   PRAPARE - Administrator, Civil Service (Medical): No    Lack of Transportation (Non-Medical): No  Physical Activity: Not on file  Stress: Not on file  Social Connections: Not on file    No family history on file.  Past Medical History:  Diagnosis Date   Cancer (HCC)    Diabetes mellitus    Enlarged prostate    Hypertension    Sleep apnea     No past surgical history on file.   Current Outpatient Medications on File Prior to Visit  Medication Sig Dispense Refill   ADVIL  200 MG CAPS Take 400 mg by mouth every 6 (six) hours as needed (for mild pain or headaches).     ALEVE  220 MG tablet Take 220-440 mg by mouth 2 (two) times daily as needed (for mild pain or headaches).     amLODipine (NORVASC) 5 MG tablet Take 5 mg by mouth daily.     cetirizine  (ZYRTEC ) 10 MG tablet Take 10 mg by mouth at bedtime.     finasteride (PROSCAR) 5 MG tablet Take 5 mg by mouth at bedtime.     oxybutynin (DITROPAN) 5 MG tablet Take 5 mg by mouth daily.     tamsulosin (FLOMAX) 0.4 MG CAPS capsule Take 0.4 mg by mouth at bedtime.      No current facility-administered medications on file prior to visit.    Allergies  Allergen Reactions   Aspirin Nausea And Vomiting     DIAGNOSTIC DATA (LABS, IMAGING, TESTING) - I reviewed patient records, labs, notes, testing and imaging myself where available.  Lab Results  Component Value Date   WBC 5.4 04/03/2024   HGB 11.1 (L) 04/03/2024   HCT 34.9 (L) 04/03/2024   MCV 90.9 04/03/2024   PLT 223 04/03/2024      Component Value Date/Time   NA 136 04/03/2024  1121   K 4.4 04/03/2024 1121  CL 107 04/03/2024 1121   CO2 26 04/03/2024 1121   GLUCOSE 90 04/03/2024 1121   BUN 20 04/03/2024 1121   CREATININE 0.71 04/03/2024 1121   CALCIUM 8.5 (L) 04/03/2024 1121   PROT 7.1 04/03/2024 1121   ALBUMIN 3.7 04/03/2024 1121   AST 13 (L) 04/03/2024 1121   ALT 9 04/03/2024 1121   ALKPHOS 80 04/03/2024 1121   BILITOT 0.6 04/03/2024 1121   GFRNONAA >60 04/03/2024 1121   GFRAA >60 07/18/2016 1513   No results found for: "CHOL", "HDL", "LDLCALC", "LDLDIRECT", "TRIG", "CHOLHDL" No results found for: "HGBA1C" No results found for: "VITAMINB12" Lab Results  Component Value Date   TSH 0.641 04/03/2024    PHYSICAL EXAM:  Today's Vitals   04/30/24 1112  BP: (!) 98/56  Pulse: (!) 58  Weight: 121 lb (54.9 kg)  Height: 5\' 1"  (1.549 m)   Body mass index is 22.86 kg/m.   Wt Readings from Last 3 Encounters:  04/30/24 121 lb (54.9 kg)  04/03/24 122 lb 12.8 oz (55.7 kg)  03/06/24 120 lb 1.6 oz (54.5 kg)     Ht Readings from Last 3 Encounters:  04/30/24 5\' 1"  (1.549 m)  04/03/24 5\' 1"  (1.549 m)  03/06/24 5\' 1"  (1.549 m)      General: The patient is awake, alert and appears not in acute distress. The patient is well groomed. Head: Normocephalic, atraumatic. Neck is supple. Mallampati 2,  neck circumference:14 inches . Nasal airflow is patent.   Retrognathia is seen.  Dental status: biological  Cardiovascular:  Regular rate and cardiac rhythm by pulse,  without distended neck veins. Respiratory: Lungs are clear to auscultation.  Skin:  Without evidence of ankle edema, or rash. Trunk: The patient's posture is erect.   NEUROLOGIC EXAM: The patient is awake and alert,  oriented to place and time.   Memory subjective described as intact.  Attention span & concentration ability appears normal.  Speech is fluent,  without  dysarthria, dysphonia or aphasia.  Mood and affect are appropriate.   Cranial nerves: no loss of smell or taste reported   Pupils are equal and briskly reactive to light. Funduscopic exam deferred.  Extraocular movements in vertical and horizontal planes were intact and without nystagmus. No Diplopia. Visual fields by finger perimetry are intact. Hearing was impaired   Facial sensation intact to fine touch.  Facial motor strength is symmetric and tongue and uvula move midline.  Neck ROM : rotation, tilt and flexion extension were normal for age and shoulder shrug was symmetrical.    Motor exam:  Symmetric bulk, tone and ROM.   Normal tone without cog-wheeling, symmetric grip strength .   Deep tendon reflexes: in the upper and lower extremities are symmetric and intact.  Babinski response was deferred.  ASSESSMENT AND PLAN:  88 y.o. year old male  here with:  Excessive daytime sleepiness -   Renal cancer and nocturia, with [facility] in [facility], but feels not impaired - no daytime sleepiness, describes sleep quality subjectively as good, but he lives alone.  He still works 40/ h week   2) OSA risk factors , retrognathia . He lost weight involuntarily, cancer related - Goddaughter tells me he has kidney cancer. Has chemotherapy. Anemia  Nocturia may be from DM or cancer.   Plan :   overall , we can assist this gentleman best with an attended sleep study.  This will allow for apnea screening and assistance with CPAP use  if needed.   He works late shifts currently and needs to plan for the stay.    I plan to follow up either personally or through our NP within 4-6 months.   I would like to thank Creasie Doctor, MD and Creasie Doctor, Md [address] [place],  [place] [address] for allowing me to meet with and to take care of this pleasant patient.     After spending a total time of  45  minutes face to face and additional time for physical and neurologic examination, review of laboratory studies,  personal review of imaging studies, reports and results of other testing and review of referral  information / records as far as provided in visit,   Electronically signed by: Neomia Banner, MD 04/30/2024 11:34 AM  [facility] Board certified by The [employer] of Sleep Medicine and Diplomate of the [employer] of Sleep Medicine. Board certified In Neurology through the ABPN, Fellow of the [employer] of Neurology.

## 2024-05-01 ENCOUNTER — Inpatient Hospital Stay

## 2024-05-01 ENCOUNTER — Inpatient Hospital Stay (HOSPITAL_BASED_OUTPATIENT_CLINIC_OR_DEPARTMENT_OTHER): Admitting: Internal Medicine

## 2024-05-01 ENCOUNTER — Telehealth: Payer: Self-pay | Admitting: Neurology

## 2024-05-01 ENCOUNTER — Inpatient Hospital Stay: Attending: Oncology

## 2024-05-01 VITALS — BP 131/61 | HR 65 | Temp 98.2°F | Resp 16 | Ht 61.0 in | Wt 125.7 lb

## 2024-05-01 DIAGNOSIS — Z5112 Encounter for antineoplastic immunotherapy: Secondary | ICD-10-CM | POA: Insufficient documentation

## 2024-05-01 DIAGNOSIS — N400 Enlarged prostate without lower urinary tract symptoms: Secondary | ICD-10-CM | POA: Diagnosis not present

## 2024-05-01 DIAGNOSIS — Z79899 Other long term (current) drug therapy: Secondary | ICD-10-CM | POA: Insufficient documentation

## 2024-05-01 DIAGNOSIS — I10 Essential (primary) hypertension: Secondary | ICD-10-CM | POA: Diagnosis not present

## 2024-05-01 DIAGNOSIS — C649 Malignant neoplasm of unspecified kidney, except renal pelvis: Secondary | ICD-10-CM

## 2024-05-01 DIAGNOSIS — C642 Malignant neoplasm of left kidney, except renal pelvis: Secondary | ICD-10-CM

## 2024-05-01 DIAGNOSIS — G4730 Sleep apnea, unspecified: Secondary | ICD-10-CM | POA: Diagnosis not present

## 2024-05-01 DIAGNOSIS — E119 Type 2 diabetes mellitus without complications: Secondary | ICD-10-CM | POA: Diagnosis not present

## 2024-05-01 LAB — CBC WITH DIFFERENTIAL (CANCER CENTER ONLY)
Abs Immature Granulocytes: 0.01 10*3/uL (ref 0.00–0.07)
Basophils Absolute: 0 10*3/uL (ref 0.0–0.1)
Basophils Relative: 0 %
Eosinophils Absolute: 0.3 10*3/uL (ref 0.0–0.5)
Eosinophils Relative: 4 %
HCT: 35.1 % — ABNORMAL LOW (ref 39.0–52.0)
Hemoglobin: 11.4 g/dL — ABNORMAL LOW (ref 13.0–17.0)
Immature Granulocytes: 0 %
Lymphocytes Relative: 12 %
Lymphs Abs: 0.9 10*3/uL (ref 0.7–4.0)
MCH: 29.7 pg (ref 26.0–34.0)
MCHC: 32.5 g/dL (ref 30.0–36.0)
MCV: 91.4 fL (ref 80.0–100.0)
Monocytes Absolute: 0.6 10*3/uL (ref 0.1–1.0)
Monocytes Relative: 9 %
Neutro Abs: 5.1 10*3/uL (ref 1.7–7.7)
Neutrophils Relative %: 75 %
Platelet Count: 180 10*3/uL (ref 150–400)
RBC: 3.84 MIL/uL — ABNORMAL LOW (ref 4.22–5.81)
RDW: 12.5 % (ref 11.5–15.5)
WBC Count: 6.9 10*3/uL (ref 4.0–10.5)
nRBC: 0 % (ref 0.0–0.2)

## 2024-05-01 LAB — CMP (CANCER CENTER ONLY)
ALT: 8 U/L (ref 0–44)
AST: 11 U/L — ABNORMAL LOW (ref 15–41)
Albumin: 3.8 g/dL (ref 3.5–5.0)
Alkaline Phosphatase: 77 U/L (ref 38–126)
Anion gap: 5 (ref 5–15)
BUN: 29 mg/dL — ABNORMAL HIGH (ref 8–23)
CO2: 25 mmol/L (ref 22–32)
Calcium: 8.5 mg/dL — ABNORMAL LOW (ref 8.9–10.3)
Chloride: 107 mmol/L (ref 98–111)
Creatinine: 0.94 mg/dL (ref 0.61–1.24)
GFR, Estimated: >60 mL/min (ref >60)
Glucose, Bld: 94 mg/dL (ref 70–99)
Potassium: 4.7 mmol/L (ref 3.5–5.1)
Sodium: 137 mmol/L (ref 135–145)
Total Bilirubin: 0.4 mg/dL (ref 0.0–1.2)
Total Protein: 7 g/dL (ref 6.5–8.1)

## 2024-05-01 LAB — TSH: TSH: 1.12 u[IU]/mL (ref 0.350–4.500)

## 2024-05-01 MED ORDER — SODIUM CHLORIDE 0.9 % IV SOLN
480.0000 mg | Freq: Once | INTRAVENOUS | Status: AC
  Administered 2024-05-01: 480 mg via INTRAVENOUS
  Filled 2024-05-01: qty 48

## 2024-05-01 MED ORDER — SODIUM CHLORIDE 0.9 % IV SOLN: Freq: Once | INTRAVENOUS | Status: AC

## 2024-05-01 NOTE — Telephone Encounter (Signed)
 unable to leave vmail mail box not set up  [place] auth: [id number] (exp. 02/24/24 to 08/22/24)

## 2024-05-01 NOTE — Patient Instructions (Signed)
 [facility] - A DEPT OF [facility]  Discharge Instructions: Thank you for choosing [facility] to provide your oncology and hematology care.   If you have a lab appointment with the Cancer Center, please go directly to the Cancer Center and check in at the registration area.   Wear comfortable clothing and clothing appropriate for easy access to any Portacath or PICC line.   We strive to give you quality time with your provider. You may need to reschedule your appointment if you arrive late (15 or more minutes).  Arriving late affects you and other patients whose appointments are after yours.  Also, if you miss three or more appointments without notifying the office, you may be dismissed from the clinic at the provider's discretion.      For prescription refill requests, have your pharmacy contact our office and allow 72 hours for refills to be completed.    Today you received the following chemotherapy and/or immunotherapy agents: Opdivo      To help prevent nausea and vomiting after your treatment, we encourage you to take your nausea medication as directed.  BELOW ARE SYMPTOMS THAT SHOULD BE REPORTED IMMEDIATELY: *FEVER GREATER THAN 100.4 F (38 C) OR HIGHER *CHILLS OR SWEATING *NAUSEA AND VOMITING THAT IS NOT CONTROLLED WITH YOUR NAUSEA MEDICATION *UNUSUAL SHORTNESS OF BREATH *UNUSUAL BRUISING OR BLEEDING *URINARY PROBLEMS (pain or burning when urinating, or frequent urination) *BOWEL PROBLEMS (unusual diarrhea, constipation, pain near the anus) TENDERNESS IN MOUTH AND THROAT WITH OR WITHOUT PRESENCE OF ULCERS (sore throat, sores in mouth, or a toothache) UNUSUAL RASH, SWELLING OR PAIN  UNUSUAL VAGINAL DISCHARGE OR ITCHING   Items with * indicate a potential emergency and should be followed up as soon as possible or go to the Emergency Department if any problems should occur.  Please show the CHEMOTHERAPY ALERT CARD or IMMUNOTHERAPY  ALERT CARD at check-in to the Emergency Department and triage nurse.  Should you have questions after your visit or need to cancel or reschedule your appointment, please contact [facility] - A DEPT OF [facility]  Dept: [phone]  and follow the prompts.  Office hours are 8:00 a.m. to 4:30 p.m. Monday - Friday. Please note that voicemails left after 4:00 p.m. may not be returned until the following business day.  We are closed weekends and major holidays. You have access to a nurse at all times for urgent questions. Please call the main number to the clinic Dept: [phone] and follow the prompts.   For any non-urgent questions, you may also contact your provider using MyChart. We now offer e-Visits for anyone 57 and older to request care online for non-urgent symptoms. For details visit [email or web address].   Also download the MyChart app! Go to the app store, search "MyChart", open the app, select , and log in with your MyChart username and password.

## 2024-05-01 NOTE — Progress Notes (Signed)
 [facility] Telephone:(336) [phone]   Fax:([phone]  OFFICE PROGRESS NOTE  Creasie Doctor, MD [address] [place] [address]  DIAGNOSIS: Stage IV left intermediate risk clear-cell renal cell carcinoma diagnosed in September 2023 with abdominal adenopathy and pulmonary nodules.  PRIOR THERAPY:  1) Status post left kidney biopsy on August 30, 2022 consistent with renal cell carcinoma. 2) Induction treatment with immunotherapy with ipilimumab  1 Mg/KG and nivolumab  3 mg/KG every 3 weeks started on September 21, 2022 status post 4 cycles with partial response. 3) Maintenance treatment with nivolumab  480 Mg IV every 4 weeks first dose started December 14, 2022.  Status post 16 cycle of the maintenance therapy.  Last dose of treatment was on March 2025.  CURRENT THERAPY: Maintenance treatment with nivolumab  480 Mg IV every 4 weeks first dose started December 14, 2022.  Status post 17 cycle of the maintenance therapy.  Last dose of treatment was on March 2025.  INTERVAL HISTORY: Anthony Sheppard 88 y.o. male returns to clinic today for follow-up visit accompanied by his god daughter Ammon Bales. Discussed the use of AI scribe software for clinical note transcription with the patient, who gave verbal consent to proceed.  History of Present Illness   Anthony Sheppard is an 88 year old male with stage four clear cell carcinoma who presents for evaluation before starting cycle number 22 of his treatment. He is accompanied by his goddaughter, Ammon Bales.  He was diagnosed with stage four clear cell carcinoma in September 2023 and is currently undergoing treatment with maintenance nivolumab , receiving 480 mg IV every four weeks. He has completed 17 cycles of this treatment, following an initial four cycles of nivolumab  and ipilimumab .  He has no new complaints since his last visit. No chest pain, breathing issues, diarrhea, or rash. His goddaughter reports a weight gain of two and a half  pounds since the previous visit.  He has no issues with the nivolumab  infusions and is handling the treatment well.        MEDICAL HISTORY: Past Medical History:  Diagnosis Date   Cancer (HCC)    Diabetes mellitus    Enlarged prostate    Hypertension    Sleep apnea     ALLERGIES:  is allergic to aspirin.  MEDICATIONS:  Current Outpatient Medications  Medication Sig Dispense Refill   ADVIL  200 MG CAPS Take 400 mg by mouth every 6 (six) hours as needed (for mild pain or headaches).     ALEVE  220 MG tablet Take 220-440 mg by mouth 2 (two) times daily as needed (for mild pain or headaches).     amLODipine (NORVASC) 5 MG tablet Take 5 mg by mouth daily.     cetirizine  (ZYRTEC ) 10 MG tablet Take 10 mg by mouth at bedtime.     finasteride (PROSCAR) 5 MG tablet Take 5 mg by mouth at bedtime.     oxybutynin (DITROPAN) 5 MG tablet Take 5 mg by mouth daily.     tamsulosin (FLOMAX) 0.4 MG CAPS capsule Take 0.4 mg by mouth at bedtime.      No current facility-administered medications for this visit.    SURGICAL HISTORY: No past surgical history on file.  REVIEW OF SYSTEMS:  A comprehensive review of systems was negative.   PHYSICAL EXAMINATION: General appearance: alert, cooperative, and no distress Head: Normocephalic, without obvious abnormality, atraumatic Neck: no adenopathy, no JVD, supple, symmetrical, trachea midline, and thyroid  not enlarged, symmetric, no tenderness/mass/nodules Lymph nodes: Cervical, supraclavicular,  and axillary nodes normal. Resp: clear to auscultation bilaterally Back: symmetric, no curvature. ROM normal. No CVA tenderness. Cardio: regular rate and rhythm, S1, S2 normal, no murmur, click, rub or gallop GI: soft, non-tender; bowel sounds normal; no masses,  no organomegaly Extremities: extremities normal, atraumatic, no cyanosis or edema  ECOG PERFORMANCE STATUS: 1 - Symptomatic but completely ambulatory  Blood pressure 131/61, pulse 65, temperature  98.2 F (36.8 C), temperature source Temporal, resp. rate 16, height 5\' 1"  (1.549 m), weight 125 lb 11.2 oz (57 kg), SpO2 100%.  LABORATORY DATA: Lab Results  Component Value Date   WBC 6.9 05/01/2024   HGB 11.4 (L) 05/01/2024   HCT 35.1 (L) 05/01/2024   MCV 91.4 05/01/2024   PLT 180 05/01/2024      Chemistry      Component Value Date/Time   NA 136 04/03/2024 1121   K 4.4 04/03/2024 1121   CL 107 04/03/2024 1121   CO2 26 04/03/2024 1121   BUN 20 04/03/2024 1121   CREATININE 0.71 04/03/2024 1121      Component Value Date/Time   CALCIUM 8.5 (L) 04/03/2024 1121   ALKPHOS 80 04/03/2024 1121   AST 13 (L) 04/03/2024 1121   ALT 9 04/03/2024 1121   BILITOT 0.6 04/03/2024 1121       RADIOGRAPHIC STUDIES: No results found.   ASSESSMENT AND PLAN: This is a very pleasant 88 years old African-American male with a stage IV intermediate risk clear-cell carcinoma diagnosed in September 2023 status post 4 cycles of induction treatment with immunotherapy with ipilimumab  and nivolumab  and starting from cycle #5 he is currently on maintenance treatment with nivolumab  480 mg IV every 4 weeks status post total of 21 cycles.   The patient continues to tolerate his treatment fairly well.    Stage 4 clear cell renal carcinoma Stage 4 clear cell renal carcinoma diagnosed in September 2023. Currently on maintenance nivolumab , receiving 240 mg IV every four weeks. Status post 17 cycles of nivolumab  and ibrunumab. No new complaints, including chest pain, dyspnea, diarrhea, or rash. Tolerating nivolumab  well with no issues. Gained 2.5 pounds since last visit. Lab work supports treatment continuation. - Administer cycle 22 of nivolumab  today. - Schedule next treatment in four weeks.   He was advised to call immediately if he has any concerning symptoms in the interval.  The patient voices understanding of current disease status and treatment options and is in agreement with the current care  plan.  All questions were answered. The patient knows to call the clinic with any problems, questions or concerns. We can certainly see the patient much sooner if necessary.  The total time spent in the appointment was 20 minutes.  Disclaimer: This note was dictated with voice recognition software. Similar sounding words can inadvertently be transcribed and may not be corrected upon review.

## 2024-05-02 ENCOUNTER — Ambulatory Visit: Admitting: Internal Medicine

## 2024-05-02 ENCOUNTER — Ambulatory Visit

## 2024-05-02 ENCOUNTER — Other Ambulatory Visit

## 2024-05-03 LAB — T4: T4, Total: 5.3 ug/dL (ref 4.5–12.0)

## 2024-05-10 NOTE — Telephone Encounter (Signed)
 Patient sister came up to the office to schedule his SS.   [employer] [place] [employer] Dress: [id number] (exp. 02/24/24 to 08/22/24)   He is scheduled at [facility] for 06/25/24 at 8 pm.  I gave her the packet of information.

## 2024-05-29 ENCOUNTER — Inpatient Hospital Stay: Attending: Oncology

## 2024-05-29 ENCOUNTER — Inpatient Hospital Stay

## 2024-05-29 ENCOUNTER — Inpatient Hospital Stay (HOSPITAL_BASED_OUTPATIENT_CLINIC_OR_DEPARTMENT_OTHER): Admitting: Internal Medicine

## 2024-05-29 VITALS — BP 131/73 | HR 57 | Temp 98.2°F | Resp 17 | Wt 122.7 lb

## 2024-05-29 DIAGNOSIS — C649 Malignant neoplasm of unspecified kidney, except renal pelvis: Secondary | ICD-10-CM | POA: Insufficient documentation

## 2024-05-29 DIAGNOSIS — C642 Malignant neoplasm of left kidney, except renal pelvis: Secondary | ICD-10-CM

## 2024-05-29 DIAGNOSIS — Z5112 Encounter for antineoplastic immunotherapy: Secondary | ICD-10-CM | POA: Diagnosis not present

## 2024-05-29 DIAGNOSIS — Z79899 Other long term (current) drug therapy: Secondary | ICD-10-CM | POA: Diagnosis not present

## 2024-05-29 LAB — CBC WITH DIFFERENTIAL (CANCER CENTER ONLY)
Abs Immature Granulocytes: 0.01 10*3/uL (ref 0.00–0.07)
Basophils Absolute: 0 10*3/uL (ref 0.0–0.1)
Basophils Relative: 1 %
Eosinophils Absolute: 0.3 10*3/uL (ref 0.0–0.5)
Eosinophils Relative: 4 %
HCT: 34.3 % — ABNORMAL LOW (ref 39.0–52.0)
Hemoglobin: 11.3 g/dL — ABNORMAL LOW (ref 13.0–17.0)
Immature Granulocytes: 0 %
Lymphocytes Relative: 13 %
Lymphs Abs: 0.8 10*3/uL (ref 0.7–4.0)
MCH: 30.1 pg (ref 26.0–34.0)
MCHC: 32.9 g/dL (ref 30.0–36.0)
MCV: 91.5 fL (ref 80.0–100.0)
Monocytes Absolute: 0.7 10*3/uL (ref 0.1–1.0)
Monocytes Relative: 12 %
Neutro Abs: 4.3 10*3/uL (ref 1.7–7.7)
Neutrophils Relative %: 70 %
Platelet Count: 228 10*3/uL (ref 150–400)
RBC: 3.75 MIL/uL — ABNORMAL LOW (ref 4.22–5.81)
RDW: 12.4 % (ref 11.5–15.5)
WBC Count: 6.2 10*3/uL (ref 4.0–10.5)
nRBC: 0 % (ref 0.0–0.2)

## 2024-05-29 LAB — CMP (CANCER CENTER ONLY)
ALT: 7 U/L (ref 0–44)
AST: 10 U/L — ABNORMAL LOW (ref 15–41)
Albumin: 3.7 g/dL (ref 3.5–5.0)
Alkaline Phosphatase: 86 U/L (ref 38–126)
Anion gap: 4 — ABNORMAL LOW (ref 5–15)
BUN: 20 mg/dL (ref 8–23)
CO2: 27 mmol/L (ref 22–32)
Calcium: 8.4 mg/dL — ABNORMAL LOW (ref 8.9–10.3)
Chloride: 107 mmol/L (ref 98–111)
Creatinine: 0.69 mg/dL (ref 0.61–1.24)
GFR, Estimated: >60 mL/min (ref >60)
Glucose, Bld: 95 mg/dL (ref 70–99)
Potassium: 4.6 mmol/L (ref 3.5–5.1)
Sodium: 138 mmol/L (ref 135–145)
Total Bilirubin: 0.4 mg/dL (ref 0.0–1.2)
Total Protein: 7 g/dL (ref 6.5–8.1)

## 2024-05-29 LAB — TSH: TSH: 1.06 u[IU]/mL (ref 0.350–4.500)

## 2024-05-29 MED ORDER — SODIUM CHLORIDE 0.9 % IV SOLN
480.0000 mg | Freq: Once | INTRAVENOUS | Status: AC
  Administered 2024-05-29: 480 mg via INTRAVENOUS
  Filled 2024-05-29: qty 48

## 2024-05-29 MED ORDER — SODIUM CHLORIDE 0.9 % IV SOLN: Freq: Once | INTRAVENOUS | Status: AC

## 2024-05-29 NOTE — Patient Instructions (Signed)
 [facility]  Discharge Instructions: Thank you for choosing [facility] to provide your oncology and hematology care.   If you have a lab appointment with the Cancer Center, please go directly to the Cancer Center and check in at the registration area.   Wear comfortable clothing and clothing appropriate for easy access to any Portacath or PICC line.   We strive to give you quality time with your provider. You may need to reschedule your appointment if you arrive late (15 or more minutes).  Arriving late affects you and other patients whose appointments are after yours.  Also, if you miss three or more appointments without notifying the office, you may be dismissed from the clinic at the provider's discretion.      For prescription refill requests, have your pharmacy contact our office and allow 72 hours for refills to be completed.    Today you received the following chemotherapy and/or immunotherapy agents opdivo      To help prevent nausea and vomiting after your treatment, we encourage you to take your nausea medication as directed.  BELOW ARE SYMPTOMS THAT SHOULD BE REPORTED IMMEDIATELY: *FEVER GREATER THAN 100.4 F (38 C) OR HIGHER *CHILLS OR SWEATING *NAUSEA AND VOMITING THAT IS NOT CONTROLLED WITH YOUR NAUSEA MEDICATION *UNUSUAL SHORTNESS OF BREATH *UNUSUAL BRUISING OR BLEEDING *URINARY PROBLEMS (pain or burning when urinating, or frequent urination) *BOWEL PROBLEMS (unusual diarrhea, constipation, pain near the anus) TENDERNESS IN MOUTH AND THROAT WITH OR WITHOUT PRESENCE OF ULCERS (sore throat, sores in mouth, or a toothache) UNUSUAL RASH, SWELLING OR PAIN  UNUSUAL VAGINAL DISCHARGE OR ITCHING   Items with * indicate a potential emergency and should be followed up as soon as possible or go to the Emergency Department if any problems should occur.  Please show the CHEMOTHERAPY ALERT CARD or IMMUNOTHERAPY  ALERT CARD at check-in to the Emergency Department and triage nurse.  Should you have questions after your visit or need to cancel or reschedule your appointment, please contact [facility] - [facility]  Dept: [phone]  and follow the prompts.  Office hours are 8:00 a.m. to 4:30 p.m. Monday - Friday. Please note that voicemails left after 4:00 p.m. may not be returned until the following business day.  We are closed weekends and major holidays. You have access to a nurse at all times for urgent questions. Please call the main number to the clinic Dept: [phone] and follow the prompts.   For any non-urgent questions, you may also contact your provider using MyChart. We now offer e-Visits for anyone 74 and older to request care online for non-urgent symptoms. For details visit [email or web address].   Also download the MyChart app! Go to the app store, search "MyChart", open the app, select [place], and log in with your MyChart username and password.

## 2024-05-29 NOTE — Progress Notes (Signed)
 [facility] Telephone:(336) [phone]   Fax:(336) [phone]  OFFICE PROGRESS NOTE  Creasie Doctor, MD [address] [place] [address] [place] [address]  DIAGNOSIS: Stage IV left intermediate risk clear-cell renal cell carcinoma diagnosed in September 2023 with abdominal adenopathy and pulmonary nodules.  PRIOR THERAPY:  1) Status post left kidney biopsy on August 30, 2022 consistent with renal cell carcinoma. 2) Induction treatment with immunotherapy with ipilimumab  1 Mg/KG and nivolumab  3 mg/KG every 3 weeks started on September 21, 2022 status post 4 cycles with partial response.  CURRENT THERAPY: Maintenance treatment with nivolumab  480 Mg IV every 4 weeks first dose started December 14, 2022.  Status post 18 cycle of the maintenance therapy.  Last dose of treatment was on March 2025.  INTERVAL HISTORY: Anthony Sheppard 88 y.o. male returns to clinic today for follow-up visit accompanied by his god daughter Ammon Bales. Discussed the use of AI scribe software for clinical note transcription with the patient, who gave verbal consent to proceed.  History of Present Illness   Anthony Sheppard is an 88 year old male with stage four left intermediate risk clear cell renal carcinoma who presents for evaluation before starting cycle nineteen of his maintenance therapy.  He was diagnosed with stage four left intermediate risk clear cell renal carcinoma in September 2023. He completed induction treatment with immunotherapy using Ipilimumab  and nivolumab  for four cycles and is currently on maintenance therapy with single-agent nivolumab . He has received eighteen cycles of maintenance therapy and is here for evaluation before starting cycle nineteen.  He experiences a 'funny feeling' when bending over, which does not cause pain or affect his balance, but feels as though it might. No chest pain, breathing issues, nausea, vomiting, diarrhea, or headaches.        MEDICAL HISTORY: Past Medical History:   Diagnosis Date   Cancer (HCC)    Diabetes mellitus    Enlarged prostate    Hypertension    Sleep apnea     ALLERGIES:  is allergic to aspirin.  MEDICATIONS:  Current Outpatient Medications  Medication Sig Dispense Refill   ADVIL  200 MG CAPS Take 400 mg by mouth every 6 (six) hours as needed (for mild pain or headaches).     ALEVE  220 MG tablet Take 220-440 mg by mouth 2 (two) times daily as needed (for mild pain or headaches).     amLODipine (NORVASC) 5 MG tablet Take 5 mg by mouth daily.     cetirizine  (ZYRTEC ) 10 MG tablet Take 10 mg by mouth at bedtime.     finasteride (PROSCAR) 5 MG tablet Take 5 mg by mouth at bedtime.     oxybutynin (DITROPAN) 5 MG tablet Take 5 mg by mouth daily.     tamsulosin (FLOMAX) 0.4 MG CAPS capsule Take 0.4 mg by mouth at bedtime.      No current facility-administered medications for this visit.    SURGICAL HISTORY: No past surgical history on file.  REVIEW OF SYSTEMS:  A comprehensive review of systems was negative.   PHYSICAL EXAMINATION: General appearance: alert, cooperative, and no distress Head: Normocephalic, without obvious abnormality, atraumatic Neck: no adenopathy, no JVD, supple, symmetrical, trachea midline, and thyroid  not enlarged, symmetric, no tenderness/mass/nodules Lymph nodes: Cervical, supraclavicular, and axillary nodes normal. Resp: clear to auscultation bilaterally Back: symmetric, no curvature. ROM normal. No CVA tenderness. Cardio: regular rate and rhythm, S1, S2 normal, no murmur, click, rub or gallop GI: soft, non-tender; bowel sounds normal; no masses,  no  organomegaly Extremities: extremities normal, atraumatic, no cyanosis or edema  ECOG PERFORMANCE STATUS: 1 - Symptomatic but completely ambulatory  Blood pressure 131/73, pulse (!) 57, temperature 98.2 F (36.8 C), temperature source Temporal, resp. rate 17, weight 122 lb 11.2 oz (55.7 kg), SpO2 96%.  LABORATORY DATA: Lab Results  Component Value Date   WBC  6.2 05/29/2024   HGB 11.3 (L) 05/29/2024   HCT 34.3 (L) 05/29/2024   MCV 91.5 05/29/2024   PLT 228 05/29/2024      Chemistry      Component Value Date/Time   NA 138 05/29/2024 1034   K 4.6 05/29/2024 1034   CL 107 05/29/2024 1034   CO2 27 05/29/2024 1034   BUN 20 05/29/2024 1034   CREATININE 0.69 05/29/2024 1034      Component Value Date/Time   CALCIUM 8.4 (L) 05/29/2024 1034   ALKPHOS 86 05/29/2024 1034   AST 10 (L) 05/29/2024 1034   ALT 7 05/29/2024 1034   BILITOT 0.4 05/29/2024 1034       RADIOGRAPHIC STUDIES: No results found.   ASSESSMENT AND PLAN: This is a very pleasant 88 years old African-American male with a stage IV intermediate risk clear-cell carcinoma diagnosed in September 2023 status post 4 cycles of induction treatment with immunotherapy with ipilimumab  and nivolumab  and starting from cycle #5 he is currently on maintenance treatment with nivolumab  480 mg IV every 4 weeks status post total of 22 cycles.   The patient has been tolerating this treatment fairly well. Assessment and Plan    Stage 4 clear cell renal carcinoma Stage 4 clear cell renal carcinoma of the left kidney, diagnosed in September 2023. Currently on maintenance therapy with nivolumab  after completing induction therapy with Ipilimumab  and nivolumab . He has completed 18 cycles of maintenance therapy and is scheduled for cycle 19. No significant side effects reported, except for a non-painful cervical sensation when bending over, which does not affect balance. Blood work is satisfactory for continuation of treatment. Treatment plan is to continue until October 2025, completing two years of therapy, with the possibility of resuming treatment if needed in the future. - Administer cycle 19 of nivolumab  maintenance therapy. - Schedule follow-up in four weeks.   He was advised to call immediately if he has any concerning symptoms in the interval. The patient voices understanding of current disease  status and treatment options and is in agreement with the current care plan.  All questions were answered. The patient knows to call the clinic with any problems, questions or concerns. We can certainly see the patient much sooner if necessary.  The total time spent in the appointment was 20 minutes.  Disclaimer: This note was dictated with voice recognition software. Similar sounding words can inadvertently be transcribed and may not be corrected upon review.

## 2024-05-30 ENCOUNTER — Other Ambulatory Visit: Payer: Self-pay

## 2024-05-30 LAB — T4: T4, Total: 4.6 ug/dL (ref 4.5–12.0)

## 2024-06-09 ENCOUNTER — Other Ambulatory Visit: Payer: Self-pay

## 2024-06-10 IMAGING — US US SCROTUM W/ DOPPLER COMPLETE
1 series · 13 of 25 positions shown · non-contrast
Comparison: None Available.

CLINICAL DATA: Scrotal pain

EXAM:
SCROTAL ULTRASOUND
DOPPLER ULTRASOUND OF THE TESTICLES
TECHNIQUE: Complete ultrasound examination of the testicles, epididymis, and
other scrotal structures was performed. Color and spectral Doppler
ultrasound were also utilized to evaluate blood flow to the
testicles.

[Series 1: us scrotum w/doppler · 13 of 75 slices shown]
[im 1/75]
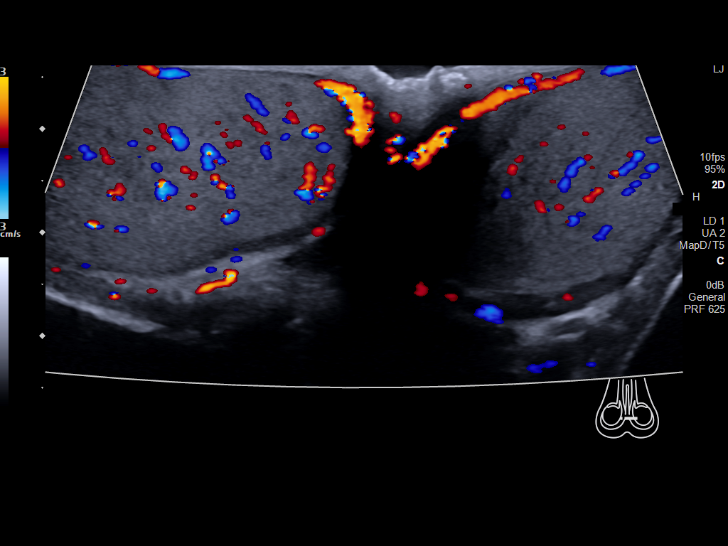
[im 7/75]
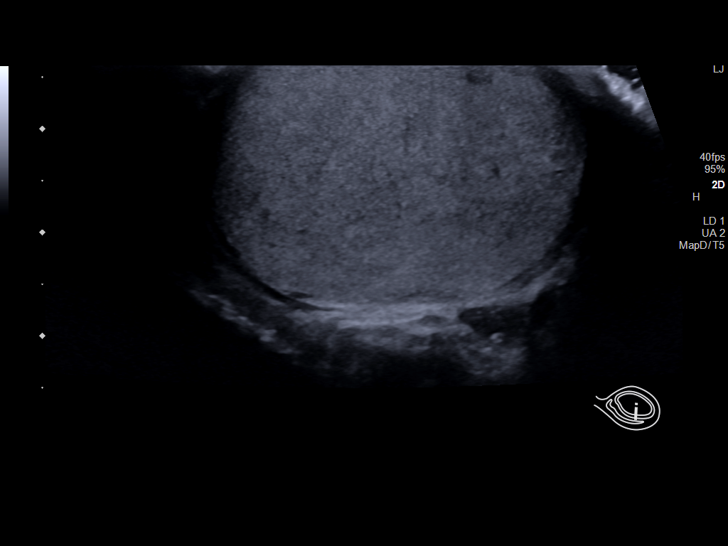
[im 13/75]
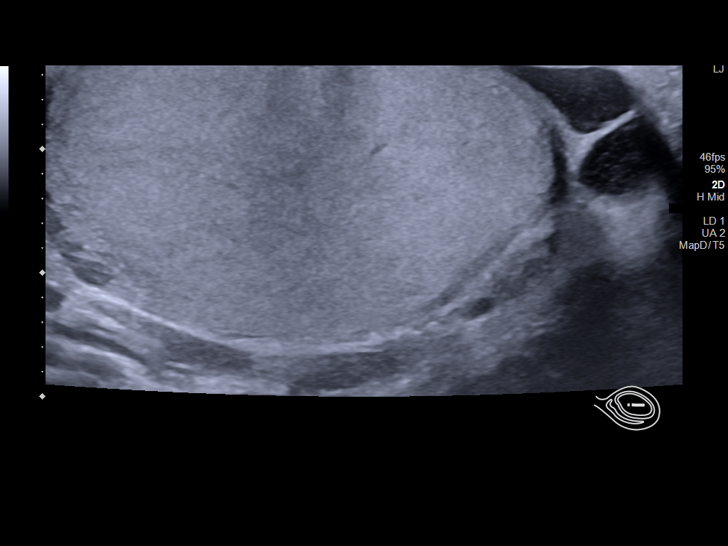
[im 19/75]
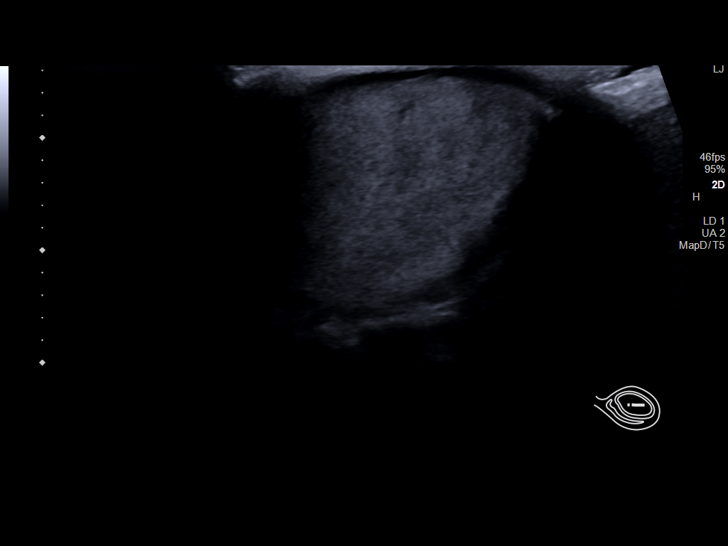
[im 25/75]
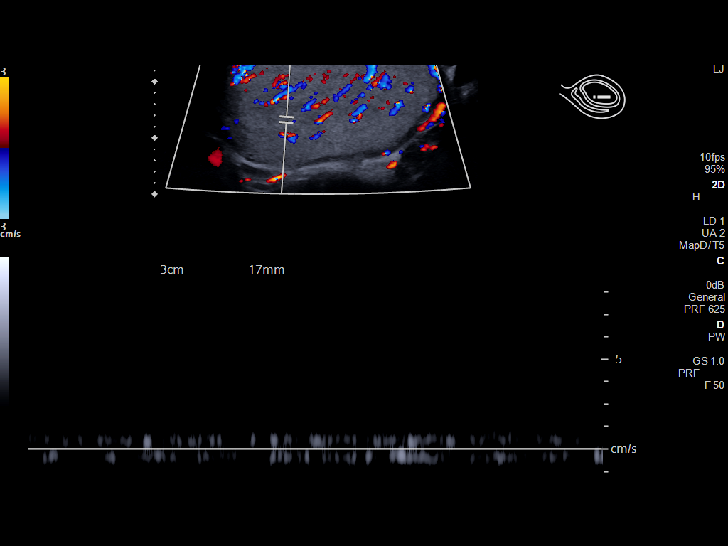
[im 31/75]
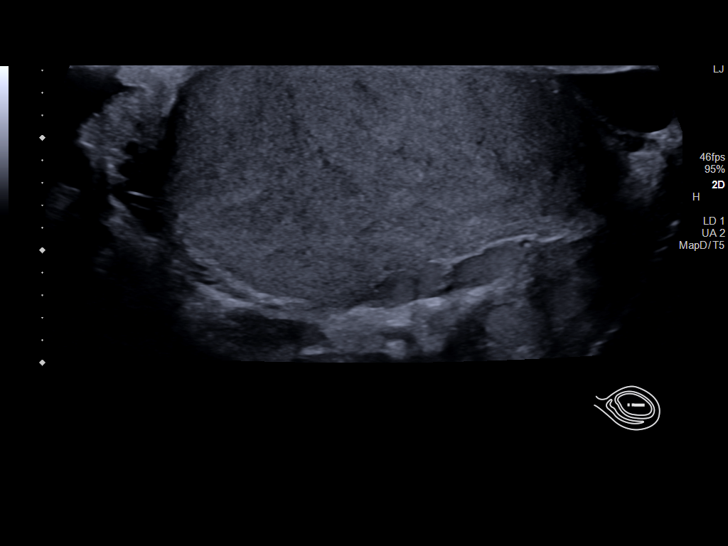
[im 38/75]
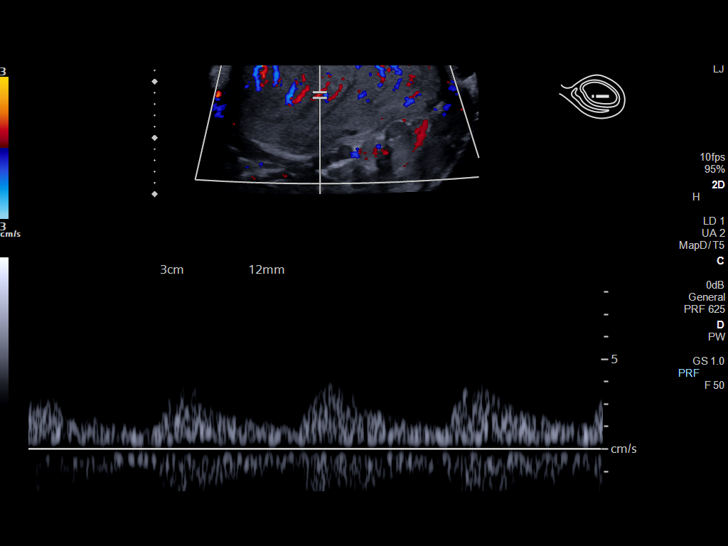
[im 44/75]
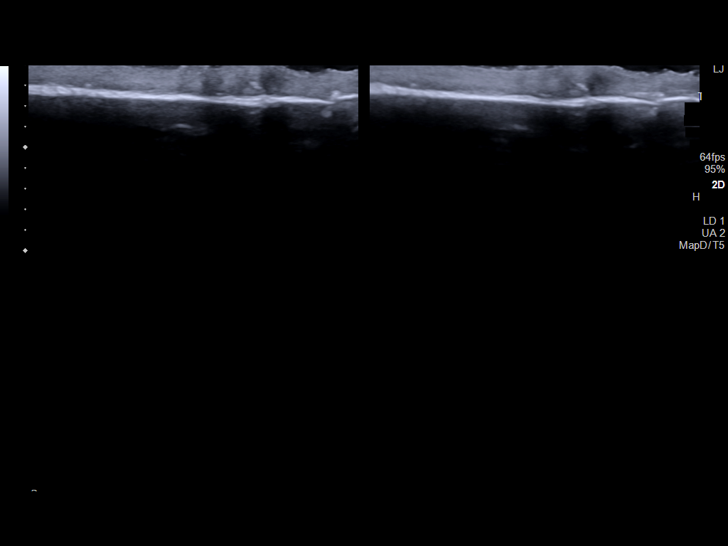
[im 50/75]
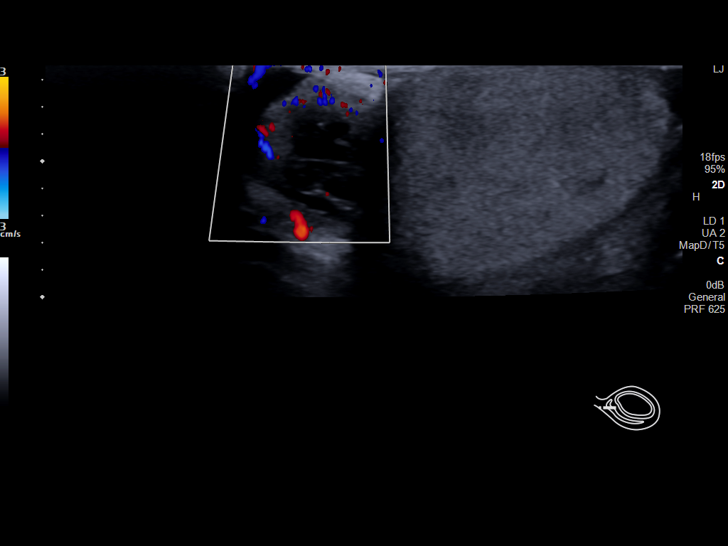
[im 56/75]
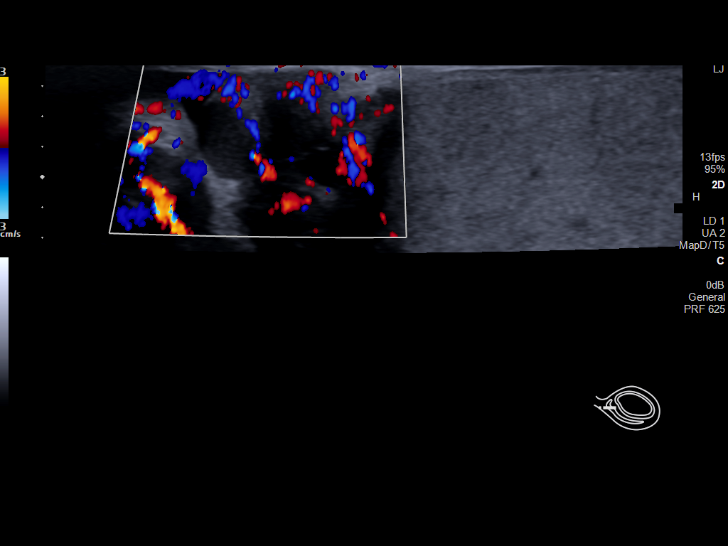
[im 62/75]
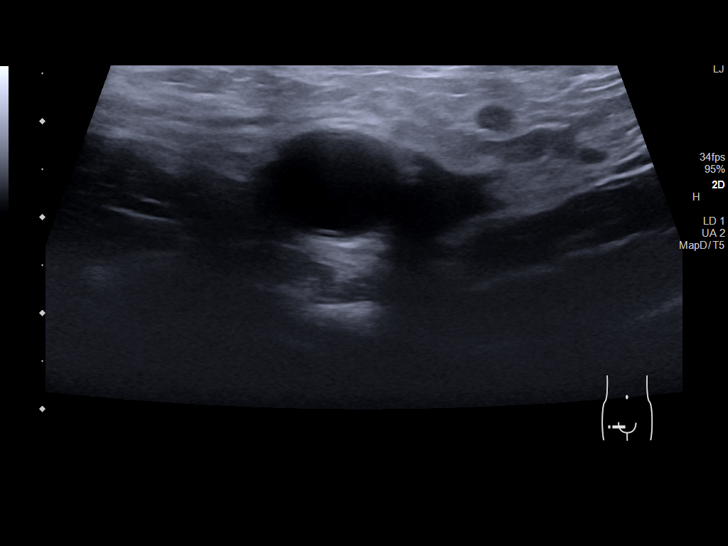
[im 68/75]
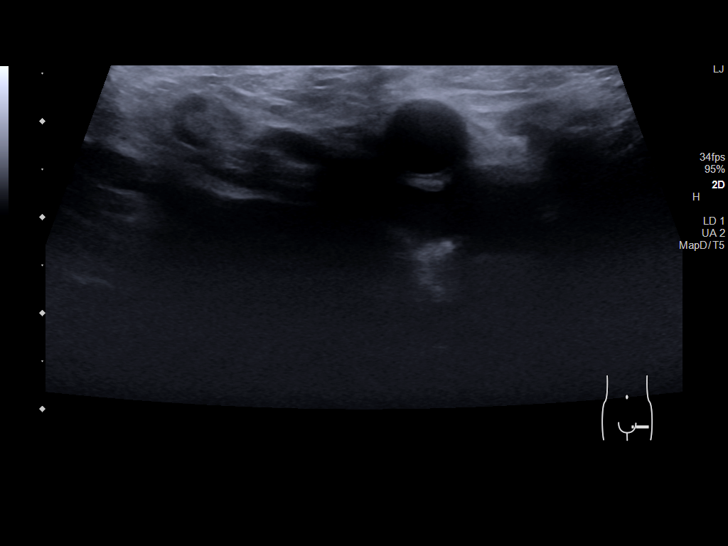
[im 75/75]
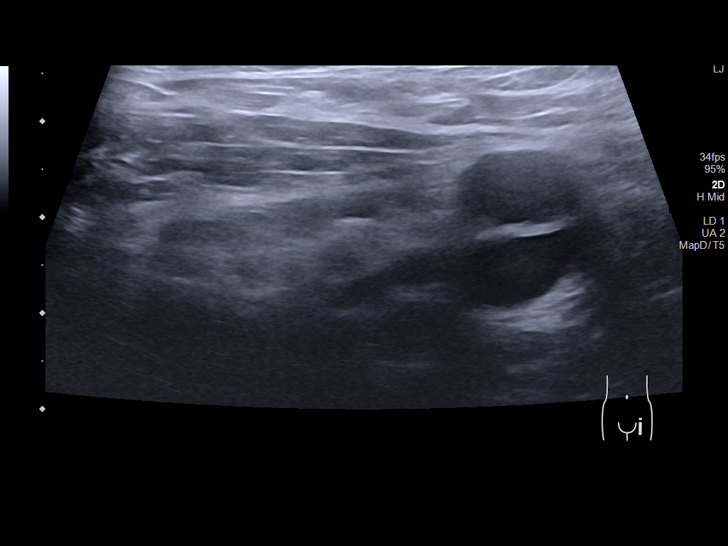

[13 of 25 positions shown; findings below may reference images not displayed]

FINDINGS: Right testicle

Measurements: 4.1 x 2 x 3.6 cm. No mass or microlithiasis
visualized.

Left testicle

Measurements: 4 x 1.9 x 3 cm. No mass or microlithiasis visualized.

Right epididymis: There are multiple cysts in the right epididymis
each measuring less than 7 mm in size. Internal echoes and internal
septations are seen in the some of these epididymal cysts.

Left epididymis:  There is 5 mm complex cyst in the left epididymis.

Hydrocele:  Small to moderate bilateral hydrocele is present.

Varicocele:  None visualized.

Pulsed Doppler interrogation of both testes demonstrates normal low
resistance arterial and venous waveforms bilaterally.
IMPRESSION: There is no evidence of testicular torsion. There is homogeneous
echogenicity in both testes.

Epididymal cysts are noted on both sides, more so on the right side.
Small to moderate bilateral hydrocele.

## 2024-06-10 IMAGING — CT CT RENAL STONE PROTOCOL
2 of 4 series · 14 of 46 positions shown, 16 images · non-contrast
Comparison: None Available.

CLINICAL DATA: Hematuria, bilateral flank pain



[Series 2: axial st · axial · 0.98mm/px · z∈[-312,+43]mm · 11 of 85 slices shown, 13 images]
[im 7/85  soft-tissue]
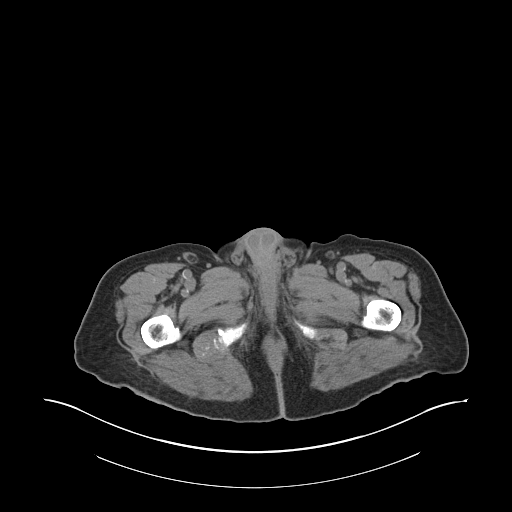
[im 7/85  bone]
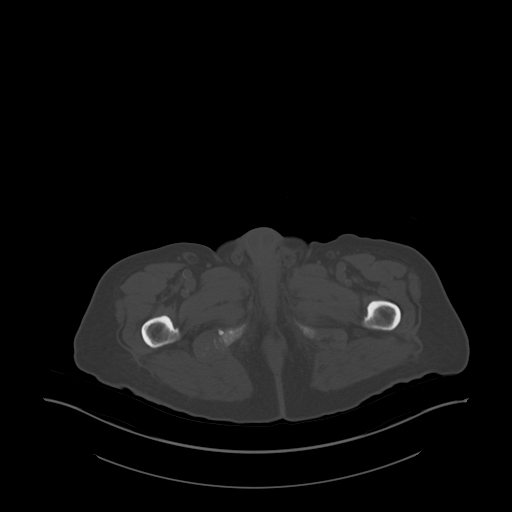
[im 13/85  soft-tissue]
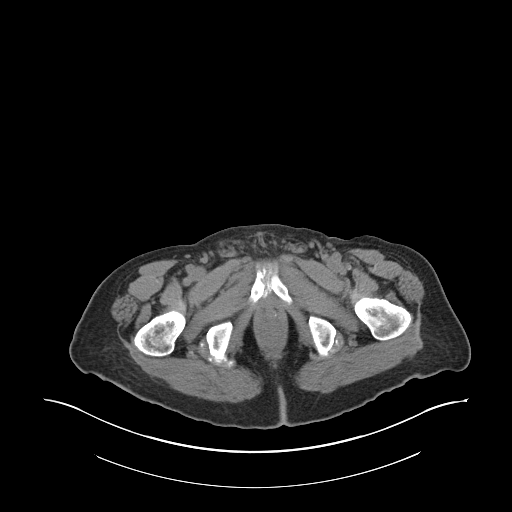
[im 20/85  soft-tissue]
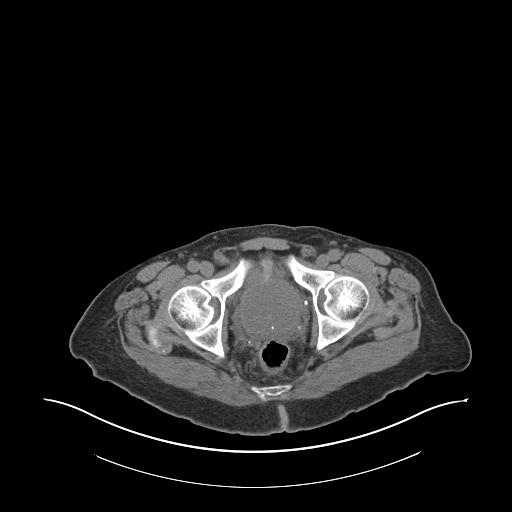
[im 26/85  soft-tissue]
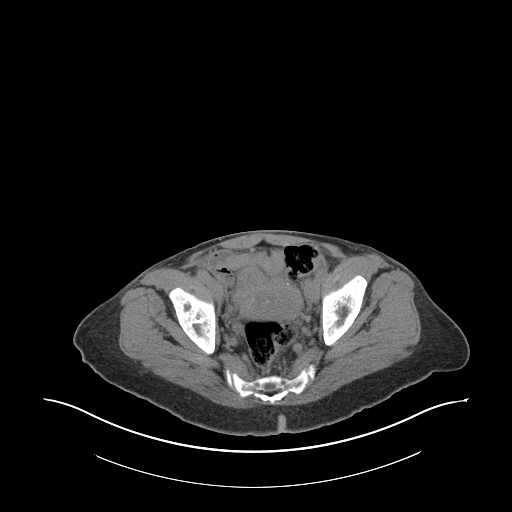
[im 33/85  soft-tissue]
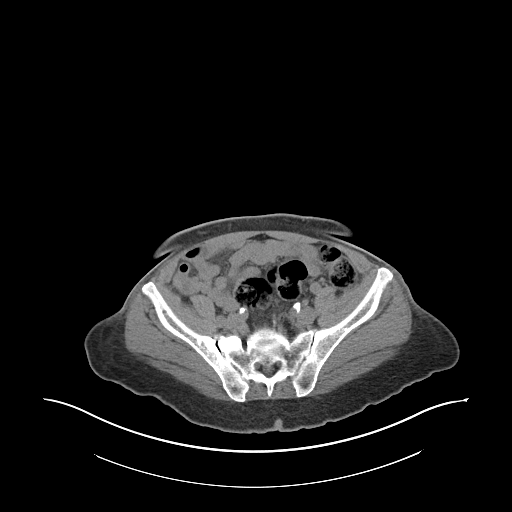
[im 46/85  soft-tissue]
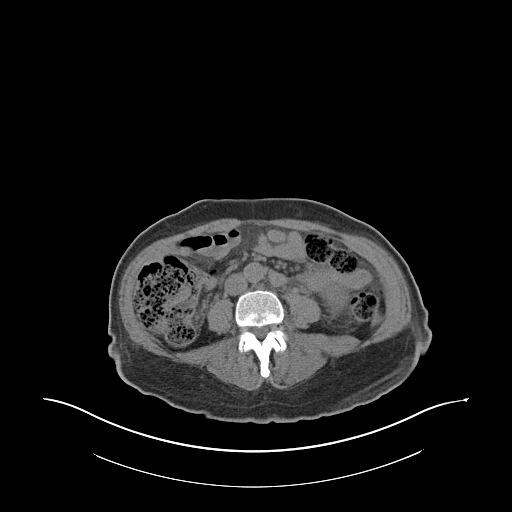
[im 52/85  soft-tissue]
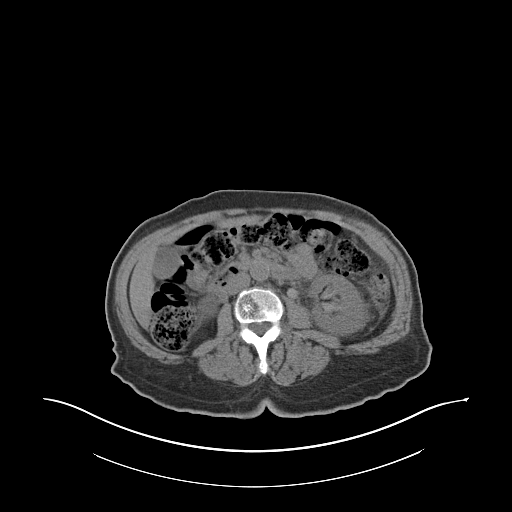
[im 59/85  soft-tissue]
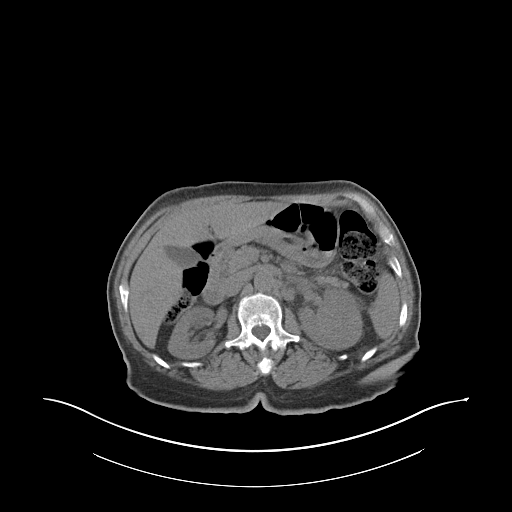
[im 65/85  soft-tissue]
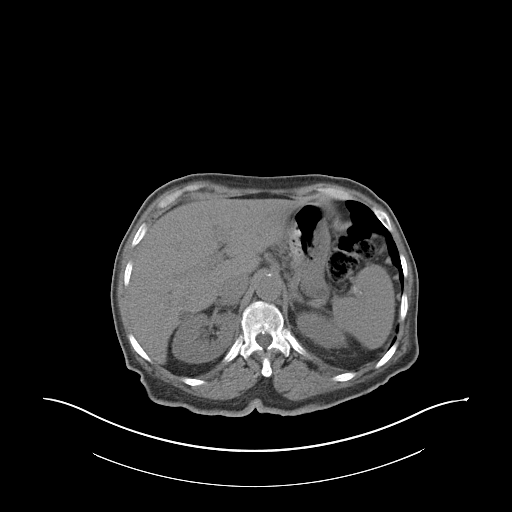
[im 65/85  bone]
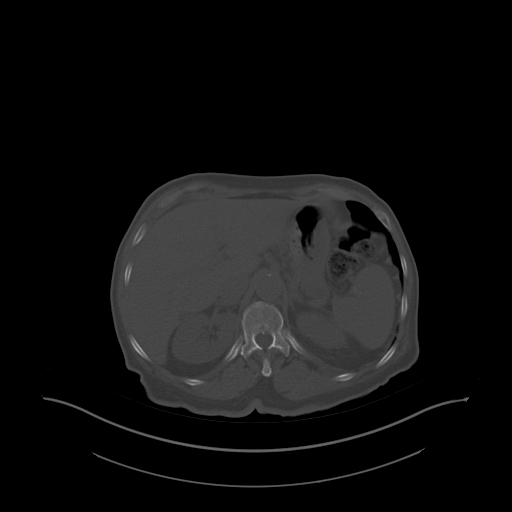
[im 72/85  soft-tissue]
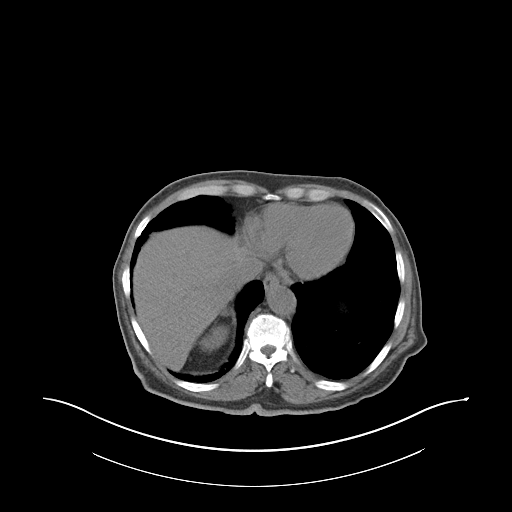
[im 78/85  soft-tissue]
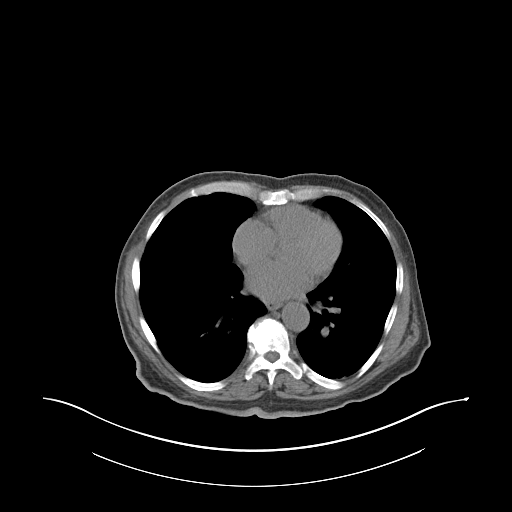

[Series 4: coronal · coronal · 0.77mm/px · 3 of 133 slices shown]
[im 45/133  soft-tissue]
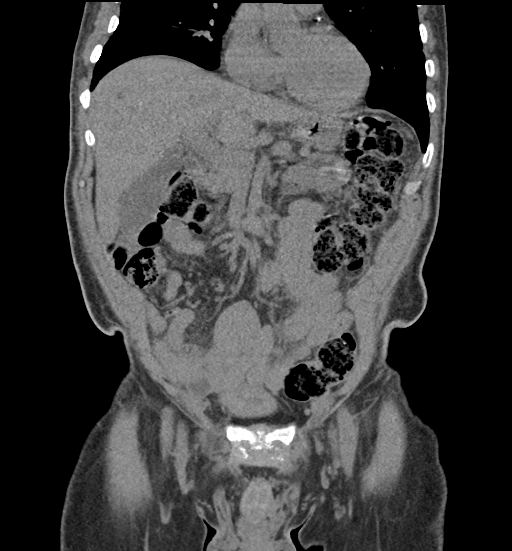
[im 59/133  soft-tissue]
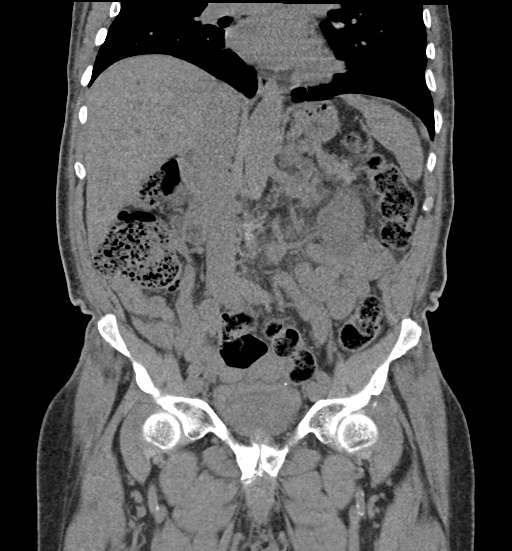
[im 74/133  soft-tissue]
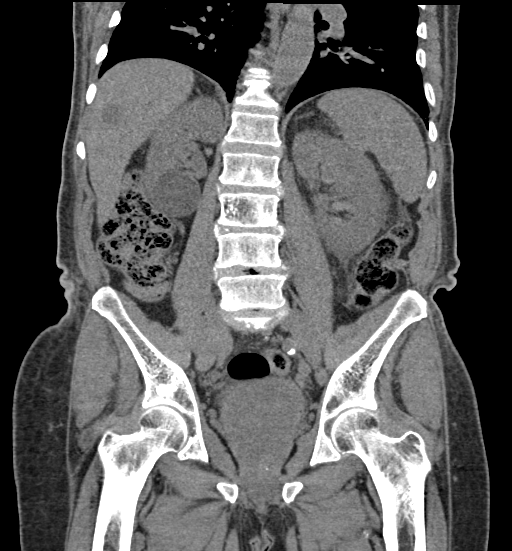

[14 of 46 positions shown; findings below may reference images not displayed]

FINDINGS: Lower chest: 7 mm noncalcified pulmonary nodule is seen within the
subpleural right middle lobe, axial image # 4, indeterminate.
Similar 6 mm subpleural pulmonary nodule noted within the left lower
lobe, axial image # 36. No pleural effusion. Mild coronary artery
calcification. Global cardiac size within normal limits.

Hepatobiliary: 2.6 cm low-attenuation lesion is seen within the
posterior right hepatic lobe, indeterminate, but possibly
representing a small hemangioma or cyst in a patient without a
history of malignancy. Smaller 10-12 mm similar appearing lesions
are seen adjacent to the dominant lesion. No intra or extrahepatic
biliary ductal dilation. Gallbladder unremarkable.

Pancreas: Unremarkable

Spleen: Unremarkable

Adrenals/Urinary Tract: The adrenal glands are unremarkable. The
kidneys are normal in size and position. There is mild left
hydronephrosis with extensive hyperdense material seen within a
distended left renal pelvocaliceal system and left ureter likely
representing blood product given the patient's history of hematuria.
There is mild left perinephric stranding which may be inflammatory
or obstructive in nature. No perinephric fluid collections are
identified. Vague 20 mm hyperdense rounded structure within the
upper pole may represent a a distended upper pole calyx, a
hyperdense renal cyst, or a solid renal mass. No intrarenal or
ureteral calculi are identified. There is no hydronephrosis on the
right. 4.2 cm simple cortical cyst noted arising from the lower pole
the right kidney, benign. No further follow-up is recommended for
this lesion. The bladder is circumferentially thick walled
suggesting changes of chronic bladder outlet obstruction or
superimposed cystitis. The bladder, however, is decompressed. A
small amount of high density material is seen within the bladder
lumen, best appreciated on coronal imaging, likely representing a
small amount of blood product.

Stomach/Bowel: Stomach is within normal limits. Appendix appears
normal. No evidence of bowel wall thickening, distention, or
inflammatory changes.

Vascular/Lymphatic: Mild aortoiliac atherosclerotic calcification.
No aortic aneurysm. Shotty left pararenal adenopathy is present
measuring up to 8 mm in short axis diameter. No frankly pathologic
adenopathy is identified within the abdomen and pelvis, however.

Reproductive: The prostate gland is markedly enlarged and indents
the base of the bladder. Seminal vesicles are unremarkable.

Other: Tiny fat containing right inguinal hernia. The rectum is
unremarkable.

Musculoskeletal: Degenerative changes are seen within the
thoracolumbar spine. No acute bone abnormality.
IMPRESSION: Mild left hydronephrosis with diffuse hyperdensity of the distended
left renal collecting system most in keeping with blood product.
Vague 2 cm hyperdense rounded structure within the upper pole the
left kidney is indeterminate with differential considerations as
listed above. Contrast enhanced renal mass protocol CT or MRI
examination is recommended for further evaluation.

Circumferential bladder wall thickening suggesting chronic bladder
outlet obstruction or diffuse cystitis. Small blood product within
the bladder lumen. The bladder is not distended.

Marked prostatic enlargement.

Bibasilar pulmonary nodules with dominant 7 mm right middle lobe
solid pulmonary nodule. Recommend a non-contrast Chest CT at 6-12
months. Dedicated CT imaging of the chest is recommended for
complete evaluation once the patient's acute issues have resolved.

These guidelines do not apply to immunocompromised patients and
patients with cancer. Follow up in patients with significant
comorbidities as clinically warranted. For lung cancer screening,
adhere to Lung-RADS guidelines. Reference: Radiology. 4111;
284(1):228-43.

Indeterminate hypodense lesions within the right hepatic lobe,
possibly representing small cysts or hemangioma in a patient without
a history of malignancy. These can be simultaneously assessed with
MRI evaluation of the left renal lesion.

Aortic Atherosclerosis ([id number]-8PA.A).

## 2024-06-18 ENCOUNTER — Emergency Department (HOSPITAL_COMMUNITY)
Admission: EM | Admit: 2024-06-18 | Discharge: 2024-06-18 | Disposition: A | Discharge status: Home / Self Care | Attending: Emergency Medicine | Admitting: Emergency Medicine

## 2024-06-18 ENCOUNTER — Emergency Department (HOSPITAL_COMMUNITY)

## 2024-06-18 ENCOUNTER — Other Ambulatory Visit: Payer: Self-pay

## 2024-06-18 ENCOUNTER — Encounter (HOSPITAL_COMMUNITY): Payer: Self-pay | Admitting: Radiology

## 2024-06-18 DIAGNOSIS — E119 Type 2 diabetes mellitus without complications: Secondary | ICD-10-CM | POA: Insufficient documentation

## 2024-06-18 DIAGNOSIS — Z79899 Other long term (current) drug therapy: Secondary | ICD-10-CM | POA: Insufficient documentation

## 2024-06-18 DIAGNOSIS — Z8546 Personal history of malignant neoplasm of prostate: Secondary | ICD-10-CM | POA: Insufficient documentation

## 2024-06-18 DIAGNOSIS — I10 Essential (primary) hypertension: Secondary | ICD-10-CM | POA: Insufficient documentation

## 2024-06-18 DIAGNOSIS — R519 Headache, unspecified: Secondary | ICD-10-CM | POA: Insufficient documentation

## 2024-06-18 DIAGNOSIS — I672 Cerebral atherosclerosis: Secondary | ICD-10-CM | POA: Diagnosis not present

## 2024-06-18 DIAGNOSIS — S0990XA Unspecified injury of head, initial encounter: Secondary | ICD-10-CM | POA: Diagnosis not present

## 2024-06-18 DIAGNOSIS — G9389 Other specified disorders of brain: Secondary | ICD-10-CM | POA: Diagnosis not present

## 2024-06-18 LAB — BASIC METABOLIC PANEL WITH GFR
Anion gap: 8 (ref 5–15)
BUN: 22 mg/dL (ref 8–23)
CO2: 24 mmol/L (ref 22–32)
Calcium: 8.5 mg/dL — ABNORMAL LOW (ref 8.9–10.3)
Chloride: 105 mmol/L (ref 98–111)
Creatinine, Ser: 0.57 mg/dL — ABNORMAL LOW (ref 0.61–1.24)
GFR, Estimated: >60 mL/min (ref >60)
Glucose, Bld: 94 mg/dL (ref 70–99)
Potassium: 4.5 mmol/L (ref 3.5–5.1)
Sodium: 137 mmol/L (ref 135–145)

## 2024-06-18 LAB — CBC
HCT: 39 % (ref 39.0–52.0)
Hemoglobin: 12.2 g/dL — ABNORMAL LOW (ref 13.0–17.0)
MCH: 30 pg (ref 26.0–34.0)
MCHC: 31.3 g/dL (ref 30.0–36.0)
MCV: 96.1 fL (ref 80.0–100.0)
Platelets: 207 10*3/uL (ref 150–400)
RBC: 4.06 MIL/uL — ABNORMAL LOW (ref 4.22–5.81)
RDW: 12.3 % (ref 11.5–15.5)
WBC: 6.2 10*3/uL (ref 4.0–10.5)
nRBC: 0 % (ref 0.0–0.2)

## 2024-06-18 NOTE — ED Provider Notes (Signed)
 [facility] DEPARTMENT AT [facility] Provider Note   CSN: [id number] Arrival date & time: 06/18/24  1049     Patient presents with: Neck Pain   Anthony Sheppard is a 88 y.o. male.    Neck Pain    Patient has a history of hypertension diabetes sleep apnea and large prostate cancer.  Patient presents ED with complaints of soreness in the back of his head.  Symptoms ongoing for couple of weeks.  Patient states it sometimes feels worse when he tilts his head down.  It makes him feel like he has to go to sleep.  He denies any focal numbness or weakness.  No blurred vision.  No fevers or chills.  Patient is concerned he might have something like a blood clot or stroke  Prior to Admission medications   Medication Sig Start Date End Date Taking? Authorizing Provider  ADVIL  200 MG CAPS Take 400 mg by mouth every 6 (six) hours as needed (for mild pain or headaches).    [provider]  ALEVE  220 MG tablet Take 220-440 mg by mouth 2 (two) times daily as needed (for mild pain or headaches).    [provider]  amLODipine (NORVASC) 5 MG tablet Take 5 mg by mouth daily.    [provider]  cetirizine  (ZYRTEC ) 10 MG tablet Take 10 mg by mouth at bedtime. 01/11/22   [provider]  finasteride (PROSCAR) 5 MG tablet Take 5 mg by mouth at bedtime.    [provider]  oxybutynin (DITROPAN) 5 MG tablet Take 5 mg by mouth daily. 04/06/24   [provider]  tamsulosin (FLOMAX) 0.4 MG CAPS capsule Take 0.4 mg by mouth at bedtime.     [provider]    Allergies: Aspirin    Review of Systems  Musculoskeletal:  Positive for neck pain.    Updated Vital Signs BP (!) 140/65 (BP Location: Right Arm)   Pulse (!) 59   Temp 97.8 F (36.6 C) (Oral)   Resp 18   Ht 1.549 m (5' 1)   Wt 54.4 kg   SpO2 99%   BMI 22.67 kg/m   Physical Exam Vitals and nursing note reviewed.  Constitutional:      General: He is not in  acute distress.    Appearance: He is well-developed.  HENT:     Head: Normocephalic and atraumatic.     Comments: Mild tenderness palpation posterior right occiput    Right Ear: External ear normal.     Left Ear: External ear normal.   Eyes:     General: No visual field deficit or scleral icterus.       Right eye: No discharge.        Left eye: No discharge.     Conjunctiva/sclera: Conjunctivae normal.   Neck:     Trachea: No tracheal deviation.   Cardiovascular:     Rate and Rhythm: Normal rate and regular rhythm.  Pulmonary:     Effort: Pulmonary effort is normal. No respiratory distress.     Breath sounds: Normal breath sounds. No stridor. No wheezing or rales.  Abdominal:     General: Bowel sounds are normal. There is no distension.     Palpations: Abdomen is soft.     Tenderness: There is no abdominal tenderness. There is no guarding or rebound.   Musculoskeletal:        General: No tenderness or deformity.     Cervical back: Normal and  neck supple. No tenderness.   Skin:    General: Skin is warm and dry.     Findings: No rash.   Neurological:     General: No focal deficit present.     Mental Status: He is alert and oriented to person, place, and time.     Cranial Nerves: No cranial nerve deficit, dysarthria or facial asymmetry.     Sensory: No sensory deficit.     Motor: No abnormal muscle tone, seizure activity or pronator drift.     Coordination: Coordination normal.     Comments:  able to hold both legs off bed for 5 seconds, sensation intact in all extremities,  no left or right sided neglect, normal finger-nose exam bilaterally, no nystagmus noted   Psychiatric:        Mood and Affect: Mood normal.     (all labs ordered are listed, but only abnormal results are displayed) Labs Reviewed  CBC - Abnormal; Notable for the following components:      Result Value   RBC 4.06 (*)    Hemoglobin 12.2 (*)    All other components within normal limits  BASIC  METABOLIC PANEL WITH GFR - Abnormal; Notable for the following components:   Creatinine, Ser 0.57 (*)    Calcium 8.5 (*)    All other components within normal limits    EKG: None  Radiology: CT Head Wo Contrast Result Date: 06/18/2024 CLINICAL DATA:  Head trauma, moderate-severe EXAM: CT HEAD WITHOUT CONTRAST TECHNIQUE: Contiguous axial images were obtained from the base of the skull through the vertex without intravenous contrast. RADIATION DOSE REDUCTION: This exam was performed according to the departmental dose-optimization program which includes automated exposure control, adjustment of the mA and/or kV according to patient size and/or use of iterative reconstruction technique. COMPARISON:  Brain MRI 09/16/2022 FINDINGS: Brain: No intracranial hemorrhage, mass effect, or midline shift. No hydrocephalus. The basilar cisterns are patent. Areas of encephalomalacia in the left superior and right inferior frontal lobes, unchanged from prior MRI. No evidence of territorial infarct or acute ischemia. No extra-axial or intracranial fluid collection. Vascular: Atherosclerosis of skullbase vasculature without hyperdense vessel or abnormal calcification. Skull: No fracture or focal lesion. Sinuses/Orbits: No acute finding. Other: None. IMPRESSION: 1. No acute intracranial abnormality. No skull fracture. 2. Areas of encephalomalacia in the left superior and right inferior frontal lobes, unchanged from prior MRI. Electronically Signed   By: Andrea Gasman M.D.   On: 06/18/2024 14:15     Procedures   Medications Ordered in the ED - No data to display  Clinical Course as of 06/18/24 1447  Mon Jun 18, 2024  1258 Basic metabolic panel(!) CBC metabolic panel unremarkable [JK]  1419 Head CT without acute abnormalities [JK]    Clinical Course User Index [JK] Randol Simmonds, MD                                 Medical Decision Making Problems Addressed: Nonintractable headache, unspecified chronicity  pattern, unspecified headache type: acute illness or injury that poses a threat to life or bodily functions  Amount and/or Complexity of Data Reviewed Labs: ordered. Decision-making details documented in ED Course. Radiology: ordered and independent interpretation performed.   Patient presented to the ED for evaluation of discomfort in the back of his head.  Patient also notes that when he leans forward it makes him feel like he wants to go to sleep.  Exam is reassuring.  Patient does not have any focal deficits.  He is not having any severe headache.  Low suspicion for stroke TIA.  CT scan does not show any evidence of hemorrhage or other acute normality.  Laboratory test did not show any signs of hypo or hypernatremia.  No signs of acute kidney injury.  No leukocytosis.  ED workup reassuring.  Have low suspicion for stroke TIA.  Patient does not have a severe headache.  Low suspicion for meningitis encephalitis or other acute abnormality.  Etiology of symptoms unclear but appears appropriate for discharge outpatient follow-up     Final diagnoses:  Nonintractable headache, unspecified chronicity pattern, unspecified headache type    ED Discharge Orders     None          Randol Simmonds, MD 06/18/24 [phone]

## 2024-06-18 NOTE — Discharge Instructions (Signed)
 The blood tests and the CT scan did not show any acute abnormality.  Follow-up with your primary care doctor to be rechecked if the symptoms do not resolve in the next week

## 2024-06-18 NOTE — ED Triage Notes (Addendum)
 Pt reports a feeling in his neck like he slept funny but not very painful. States when he tries to read he starts to fall asleep. Sometimes a funny feeling in his head. No headache. Going on about 2 weeks. Stroke screen negative

## 2024-06-19 ENCOUNTER — Encounter

## 2024-06-20 NOTE — Progress Notes (Signed)
 [facility] OFFICE PROGRESS NOTE  [name], MD [address] 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 2) Induction treatment with immunotherapy with ipilimumab  1 Mg/KG and nivolumab  3 mg/KG every 3 weeks started on September 21, 2022 status post 4 cycles with partial response.  CURRENT THERAPY: Maintenance treatment with nivolumab  480 Mg IV every 4 weeks first dose started December 14, 2022. Status post 23 cycle of the maintenance therapy.   INTERVAL HISTORY: Arnet Hofferber 88 y.o. male returns to the clinic today for a follow-up visit accompanied by his niece. The patient is feeling well today without any concerning complaints. He is currently undergoing immunotherapy with nivolumab  IV every 4 weeks and is tolerating it well without any concerning adverse side effects. He was last seen 4 weeks ago by Dr. Sherrod.   In the interval since last being seen, he has been seen in the ER for neck pain. No acute process was found on CT of the head. He also was endorsing this on his last visit with Dr. Sherrod. He tells me when he flexes his neck forward (for example, when he tries to read), he gets tired and gets an abnormal sensation in his head.     He denies any recent fever, chills, or night sweats.  Denies any nausea, vomiting, diarrhea, or constipation. Denies any chest pain, shortness of breath, cough, or hemoptysis. Denies any rashes or skin changes. Denies any unusual abdominal pain or back pain.  Denies any hematuria, malodorous urine, or cloudy urine. He is here today for evaluation and to review his scan before undergoing cycle #24  MEDICAL HISTORY: Past Medical History:  Diagnosis Date   Cancer (HCC)    Diabetes mellitus    Enlarged prostate     Hypertension    Sleep apnea     ALLERGIES:  is allergic to aspirin.  MEDICATIONS:  Current Outpatient Medications  Medication Sig Dispense Refill   ADVIL  200 MG CAPS Take 400 mg by mouth every 6 (six) hours as needed (for mild pain or headaches).     ALEVE  220 MG tablet Take 220-440 mg by mouth 2 (two) times daily as needed (for mild pain or headaches).     amLODipine (NORVASC) 5 MG tablet Take 5 mg by mouth daily.     cetirizine  (ZYRTEC ) 10 MG tablet Take 10 mg by mouth at bedtime.     finasteride (PROSCAR) 5 MG tablet Take 5 mg by mouth at bedtime.     oxybutynin (DITROPAN) 5 MG tablet Take 5 mg by mouth daily.     tamsulosin (FLOMAX) 0.4 MG CAPS capsule Take 0.4 mg by mouth at bedtime.      No current facility-administered medications for this visit.    SURGICAL HISTORY: No past surgical history on file.  REVIEW OF SYSTEMS:   Review of Systems  Constitutional: Negative for appetite change, chills, fatigue, fever and unexpected weight change.  HENT: Negative for mouth sores, nosebleeds, sore throat and trouble swallowing.   Eyes: Negative for eye problems and icterus.  Respiratory: Negative for cough, hemoptysis, shortness of breath and wheezing.   Cardiovascular: Negative for chest pain and leg swelling.  Gastrointestinal: Negative for abdominal pain, constipation, diarrhea, nausea and vomiting.  Genitourinary: Negative for bladder incontinence, difficulty urinating, dysuria, frequency and hematuria.  Musculoskeletal: Negative for back pain, gait problem, neck pain and neck stiffness.  Skin: Negative for rashes or skin changes.  Neurological: Positive for abnormal sensation in neck and head with flexion of the neck. Negative for dizziness, extremity weakness, gait problem, headaches, light-headedness and seizures.  Hematological: Negative for adenopathy. Does not bruise/bleed easily.  Psychiatric/Behavioral: Negative for confusion, depression and sleep disturbance. The  patient is not nervous/anxious.     PHYSICAL EXAMINATION:  There were no vitals taken for this visit.  ECOG PERFORMANCE STATUS: 1-2  Physical Exam  Constitutional: Oriented to person, place, and time and elderly appearing male and in no distress.  HENT:  Head: Normocephalic and atraumatic.  Mouth/Throat: Oropharynx is clear and moist. No oropharyngeal exudate.  Eyes: Conjunctivae are normal. Right eye exhibits no discharge. Left eye exhibits no discharge. No scleral icterus.  Neck: Normal range of motion. Neck supple.  Cardiovascular: Normal rate, regular rhythm, normal heart sounds and intact distal pulses.   Pulmonary/Chest: Effort normal and breath sounds normal. No respiratory distress. No wheezes. No rales.  Abdominal: Soft. Bowel sounds are normal. Exhibits no distension and no mass. There is no tenderness.  Musculoskeletal: Normal range of motion. Exhibits no edema.  Lymphadenopathy:    No cervical adenopathy.  Neurological: Alert and oriented to person, place, and time. Exhibits normal muscle tone. Gait normal. Coordination normal.  Skin: Skin is warm and dry. No rash noted. Not diaphoretic. No erythema. No pallor.  Psychiatric: Mood, memory and judgment normal.  Vitals reviewed.  LABORATORY DATA: Lab Results  Component Value Date   WBC 6.2 06/18/2024   HGB 12.2 (L) 06/18/2024   HCT 39.0 06/18/2024   MCV 96.1 06/18/2024   PLT 207 06/18/2024      Chemistry      Component Value Date/Time   NA 137 06/18/2024 1211   K 4.5 06/18/2024 1211   CL 105 06/18/2024 1211   CO2 24 06/18/2024 1211   BUN 22 06/18/2024 1211   CREATININE 0.57 (L) 06/18/2024 1211   CREATININE 0.69 05/29/2024 1034      Component Value Date/Time   CALCIUM 8.5 (L) 06/18/2024 1211   ALKPHOS 86 05/29/2024 1034   AST 10 (L) 05/29/2024 1034   ALT 7 05/29/2024 1034   BILITOT 0.4 05/29/2024 1034       RADIOGRAPHIC STUDIES:  CT Head Wo Contrast Result Date: 06/18/2024 CLINICAL DATA:  Head  trauma, moderate-severe EXAM: CT HEAD WITHOUT CONTRAST TECHNIQUE: Contiguous axial images were obtained from the base of the skull through the vertex without intravenous contrast. RADIATION DOSE REDUCTION: This exam was performed according to the departmental dose-optimization program which includes automated exposure control, adjustment of the mA and/or kV according to patient size and/or use of iterative reconstruction technique. COMPARISON:  Brain MRI 09/16/2022 FINDINGS: Brain: No intracranial hemorrhage, mass effect, or midline shift. No hydrocephalus. The basilar cisterns are patent. Areas of encephalomalacia in the left superior and right inferior frontal lobes, unchanged from prior MRI. No evidence of territorial infarct or acute ischemia. No extra-axial or intracranial fluid collection. Vascular: Atherosclerosis of skullbase vasculature without hyperdense vessel or abnormal calcification. Skull: No fracture or focal lesion. Sinuses/Orbits: No acute finding. Other: None. IMPRESSION: 1. No acute intracranial abnormality. No skull fracture. 2. Areas of encephalomalacia in the left superior and right inferior frontal lobes, unchanged from prior MRI. Electronically Signed   By: Andrea Gasman M.D.   On: 06/18/2024 14:15     ASSESSMENT/PLAN:  This is a very pleasant 88 year old  African-American male with a stage IV intermediate risk clear-cell carcinoma diagnosed in September 2023 status post 4 cycles of induction treatment with immunotherapy with ipilimumab  and nivolumab  and he is currently on maintenance treatment with nivolumab  480 mg IV every 4 weeks status post total of 23 cycles.  The patient had good response after cycle #4. He is currently tolerating his treatment with the immunotherapy.   Labs were reviewed. Recommend he proceed with cycle #24 as scheduled.   I will see him back for follow-up visit in 4 weeks for evaluation before starting cycle #25.   I will arrange for a restaging CT of  the chest, AP? Prior to his next cycle of treatment. I will add neck to ensure there is no lesion in the neck that is causing compression causing his symptoms. In the meantime, we talked about alternative positioning to avoid flexion of the neck.   The patient was advised to call immediately if he has any concerning symptoms in the interval. The patient voices understanding of current disease status and treatment options and is in agreement with the current care plan. All questions were answered. The patient knows to call the clinic with any problems, questions or concerns. We can certainly see the patient much sooner if necessary      No orders of the defined types were placed in this encounter.    The total time spent in the appointment was 20-29 minutes  Lane Kjos L Trea Carnegie, PA-C 06/20/24

## 2024-06-25 ENCOUNTER — Ambulatory Visit (INDEPENDENT_AMBULATORY_CARE_PROVIDER_SITE_OTHER): Admitting: Neurology

## 2024-06-25 DIAGNOSIS — Z8669 Personal history of other diseases of the nervous system and sense organs: Secondary | ICD-10-CM

## 2024-06-25 DIAGNOSIS — R5383 Other fatigue: Secondary | ICD-10-CM

## 2024-06-25 DIAGNOSIS — C642 Malignant neoplasm of left kidney, except renal pelvis: Secondary | ICD-10-CM

## 2024-06-25 DIAGNOSIS — G4710 Hypersomnia, unspecified: Secondary | ICD-10-CM | POA: Diagnosis not present

## 2024-06-25 DIAGNOSIS — Z87898 Personal history of other specified conditions: Secondary | ICD-10-CM

## 2024-06-25 DIAGNOSIS — G4719 Other hypersomnia: Secondary | ICD-10-CM

## 2024-06-26 ENCOUNTER — Inpatient Hospital Stay

## 2024-06-26 ENCOUNTER — Inpatient Hospital Stay (HOSPITAL_BASED_OUTPATIENT_CLINIC_OR_DEPARTMENT_OTHER): Admitting: Physician Assistant

## 2024-06-26 ENCOUNTER — Inpatient Hospital Stay: Attending: Oncology

## 2024-06-26 VITALS — BP 150/80 | HR 57 | Temp 98.0°F | Resp 13 | Wt 124.2 lb

## 2024-06-26 DIAGNOSIS — Z5112 Encounter for antineoplastic immunotherapy: Secondary | ICD-10-CM | POA: Insufficient documentation

## 2024-06-26 DIAGNOSIS — G4730 Sleep apnea, unspecified: Secondary | ICD-10-CM | POA: Insufficient documentation

## 2024-06-26 DIAGNOSIS — C649 Malignant neoplasm of unspecified kidney, except renal pelvis: Secondary | ICD-10-CM | POA: Diagnosis not present

## 2024-06-26 DIAGNOSIS — E119 Type 2 diabetes mellitus without complications: Secondary | ICD-10-CM | POA: Diagnosis not present

## 2024-06-26 DIAGNOSIS — N400 Enlarged prostate without lower urinary tract symptoms: Secondary | ICD-10-CM | POA: Diagnosis not present

## 2024-06-26 DIAGNOSIS — I10 Essential (primary) hypertension: Secondary | ICD-10-CM | POA: Insufficient documentation

## 2024-06-26 DIAGNOSIS — Z79899 Other long term (current) drug therapy: Secondary | ICD-10-CM | POA: Diagnosis not present

## 2024-06-26 DIAGNOSIS — C642 Malignant neoplasm of left kidney, except renal pelvis: Secondary | ICD-10-CM

## 2024-06-26 LAB — CBC WITH DIFFERENTIAL (CANCER CENTER ONLY)
Abs Immature Granulocytes: 0.02 K/uL (ref 0.00–0.07)
Basophils Absolute: 0 K/uL (ref 0.0–0.1)
Basophils Relative: 0 %
Eosinophils Absolute: 0.4 K/uL (ref 0.0–0.5)
Eosinophils Relative: 7 %
HCT: 36.1 % — ABNORMAL LOW (ref 39.0–52.0)
Hemoglobin: 11.9 g/dL — ABNORMAL LOW (ref 13.0–17.0)
Immature Granulocytes: 0 %
Lymphocytes Relative: 15 %
Lymphs Abs: 0.8 K/uL (ref 0.7–4.0)
MCH: 30.1 pg (ref 26.0–34.0)
MCHC: 33 g/dL (ref 30.0–36.0)
MCV: 91.4 fL (ref 80.0–100.0)
Monocytes Absolute: 0.6 K/uL (ref 0.1–1.0)
Monocytes Relative: 11 %
Neutro Abs: 3.2 K/uL (ref 1.7–7.7)
Neutrophils Relative %: 67 %
Platelet Count: 197 K/uL (ref 150–400)
RBC: 3.95 MIL/uL — ABNORMAL LOW (ref 4.22–5.81)
RDW: 12.1 % (ref 11.5–15.5)
WBC Count: 4.9 K/uL (ref 4.0–10.5)
nRBC: 0 % (ref 0.0–0.2)

## 2024-06-26 LAB — CMP (CANCER CENTER ONLY)
ALT: 7 U/L (ref 0–44)
AST: 11 U/L — ABNORMAL LOW (ref 15–41)
Albumin: 3.6 g/dL (ref 3.5–5.0)
Alkaline Phosphatase: 83 U/L (ref 38–126)
Anion gap: 6 (ref 5–15)
BUN: 20 mg/dL (ref 8–23)
CO2: 25 mmol/L (ref 22–32)
Calcium: 8.7 mg/dL — ABNORMAL LOW (ref 8.9–10.3)
Chloride: 107 mmol/L (ref 98–111)
Creatinine: 0.71 mg/dL (ref 0.61–1.24)
GFR, Estimated: >60 mL/min (ref >60)
Glucose, Bld: 148 mg/dL — ABNORMAL HIGH (ref 70–99)
Potassium: 4 mmol/L (ref 3.5–5.1)
Sodium: 138 mmol/L (ref 135–145)
Total Bilirubin: 0.5 mg/dL (ref 0.0–1.2)
Total Protein: 7.1 g/dL (ref 6.5–8.1)

## 2024-06-26 LAB — TSH: TSH: 0.869 u[IU]/mL (ref 0.350–4.500)

## 2024-06-26 MED ORDER — SODIUM CHLORIDE 0.9 % IV SOLN
480.0000 mg | Freq: Once | INTRAVENOUS | Status: AC
  Administered 2024-06-26: 480 mg via INTRAVENOUS
  Filled 2024-06-26: qty 48

## 2024-06-26 MED ORDER — SODIUM CHLORIDE 0.9 % IV SOLN
Freq: Once | INTRAVENOUS | Status: AC
Start: 2024-06-26 — End: 2024-06-26

## 2024-06-26 NOTE — Patient Instructions (Signed)
 [facility]  Discharge Instructions: Thank you for choosing [facility] to provide your oncology and hematology care.   If you have a lab appointment with the Cancer Center, please go directly to the Cancer Center and check in at the registration area.   Wear comfortable clothing and clothing appropriate for easy access to any Portacath or PICC line.   We strive to give you quality time with your provider. You may need to reschedule your appointment if you arrive late (15 or more minutes).  Arriving late affects you and other patients whose appointments are after yours.  Also, if you miss three or more appointments without notifying the office, you may be dismissed from the clinic at the provider's discretion.      For prescription refill requests, have your pharmacy contact our office and allow 72 hours for refills to be completed.    Today you received the following chemotherapy and/or immunotherapy agents opdivo      To help prevent nausea and vomiting after your treatment, we encourage you to take your nausea medication as directed.  BELOW ARE SYMPTOMS THAT SHOULD BE REPORTED IMMEDIATELY: *FEVER GREATER THAN 100.4 F (38 C) OR HIGHER *CHILLS OR SWEATING *NAUSEA AND VOMITING THAT IS NOT CONTROLLED WITH YOUR NAUSEA MEDICATION *UNUSUAL SHORTNESS OF BREATH *UNUSUAL BRUISING OR BLEEDING *URINARY PROBLEMS (pain or burning when urinating, or frequent urination) *BOWEL PROBLEMS (unusual diarrhea, constipation, pain near the anus) TENDERNESS IN MOUTH AND THROAT WITH OR WITHOUT PRESENCE OF ULCERS (sore throat, sores in mouth, or a toothache) UNUSUAL RASH, SWELLING OR PAIN  UNUSUAL VAGINAL DISCHARGE OR ITCHING   Items with * indicate a potential emergency and should be followed up as soon as possible or go to the Emergency Department if any problems should occur.  Please show the CHEMOTHERAPY ALERT CARD or IMMUNOTHERAPY  ALERT CARD at check-in to the Emergency Department and triage nurse.  Should you have questions after your visit or need to cancel or reschedule your appointment, please contact [facility] - [facility]  Dept: [phone]  and follow the prompts.  Office hours are 8:00 a.m. to 4:30 p.m. Monday - Friday. Please note that voicemails left after 4:00 p.m. may not be returned until the following business day.  We are closed weekends and major holidays. You have access to a nurse at all times for urgent questions. Please call the main number to the clinic Dept: [phone] and follow the prompts.   For any non-urgent questions, you may also contact your provider using MyChart. We now offer e-Visits for anyone 74 and older to request care online for non-urgent symptoms. For details visit [email or web address].   Also download the MyChart app! Go to the app store, search "MyChart", open the app, select [place], and log in with your MyChart username and password.

## 2024-06-27 LAB — T4: T4, Total: 5.3 ug/dL (ref 4.5–12.0)

## 2024-07-08 ENCOUNTER — Ambulatory Visit: Payer: Self-pay | Admitting: Neurology

## 2024-07-08 NOTE — Procedures (Signed)
 [facility] POLYSOMNOGRAPHY  INTERPRETATION REPORT   STUDY DATE:  06/25/2024     PATIENT NAME:  Anthony Sheppard         DATE OF BIRTH:  1936/12/08  PATIENT ID:  [id number]    TYPE OF STUDY:  PSG  READING PHYSICIAN: DEDRA GORES, MD REFERRED BY:  [facility]  Dr Nicholaus.  SCORING TECHNICIAN: Jesusa Haddock, RPSGT   HISTORY:  This 88 year-old Male patient was seen on  04-30-2024 and has a chief complaint of a previous OSA dx but never used a prescribed CPAP at the time ( about 8 years ago ) and current unexplained weight loss,  fatigue and sleepiness.  Suspected to reflect sleep apnea symptoms. This patient was exposed to agent Orange, he reported.   Past medical history : Cancer patient with history of  chemotherapy , which may explain the weight loss and fatigue  ADDITIONAL INFORMATION:  The Epworth Sleepiness Scale endorsed at  22 /24 points (scores above or equal to 10 are suggestive of hypersomnolence).  FSS endorsed at   /63 points.  Height: 61 in Weight: 121 lbs (BMI 22) Neck Size: 14 in  MEDICATIONS: Advil , Aleve , Norvasc, Zyrtec , Proscar, Ditropan, Flomax   TECHNICAL DESCRIPTION: A registered sleep technologist ( RPSGT)  was in attendance for the duration of the recording.  Data collection, scoring, video monitoring, and reporting were performed in compliance with the AASM Manual for the Scoring of Sleep and Associated Events; (Hypopnea is scored based on the criteria listed in Section VIII D. 1b in the AASM Manual V2.6 using a 4% oxygen desaturation rule or Hypopnea is scored based on the criteria listed in Section VIII D. 1a in the AASM Manual V2.6 using 3% oxygen desaturation and /or arousal rule).   SLEEP CONTINUITY AND SLEEP ARCHITECTURE:  Lights-out was at 21:49: and lights-on at  04:59:, with  7.2  Hours of recording time . Total sleep time ( TST) was 304.0 minutes with a decreased sleep efficiency at 70.9%.   Sleep latency  was normal at 9.5 minutes.  REM sleep latency was increased at 300.5 minutes. Of the total sleep time, the percentage of stage N1 sleep was 9.9%, stage N2 sleep was 79%, stage N3 sleep was 6.4%, and REM sleep was 4.9%.  There were 1 Stage R periods observed on this study night, 23 awakenings (i.e. transitions to Stage W from any sleep stage), and 81 total stage transitions. Wake after sleep onset (WASO) time accounted for 115 minutes .   BODY POSITION:  TST was divided  between the following sleep positions as follows: supine 137 minutes (45%), non-supine 167 minutes (55%); divided into :  right 66 minutes (22%), left 100 minutes (33%), and prone 00 minutes (0%).  Total supine REM sleep time was 00 minutes (0% of total REM sleep).  RESPIRATORY MONITORING:  [facility] patient :  Based on CMS criteria (using a 4% oxygen desaturation rule for scoring hypopneas), there were 1 apneas (1 obstructive; 0 central; 0 mixed), and 2 hypopneas.   Apnea index was 0.2. Hypopnea index was 0.4.  The AHI, or apnea-hypopnea index was 0.6/h  overall (1.3 supine, 0 non-supine; 0.0 REM, 0.0 supine REM).  There were 0 respiratory effort-related arousals (RERAs)  OXIMETRY: Oxyhemoglobin Saturation Nadir during sleep was at  87% from a mean of 91%.  Total sleep time (TST) in hypoxemia (<89%) was  for  2.4 minutes, or 0.8% of total  sleep time.  LIMB MOVEMENTS: There were 0 periodic limb movements of sleep (0.0/hr), of which 0 (0.0/hr) were associated with an arousal. AROUSAL: There were 52 arousals in total, for an arousal index of 8 arousals/hour.  Of these, 3 were identified as respiratory-related arousals (1 /h), 0 were PLM-related arousals (0 /h), and 49 were non-specific arousals (10 /h). EEG:  PSG EEG was of low amplitude and  normal frequency, with symmetric manifestation of sleep stages. EKG: The electrocardiogram documented NSR with PVCs. (Epoch 641).  The average heart rate during sleep was 59 bpm.  The heart rate during  sleep varied between a minimum of 54 bpm and  a maximum of  77 bpm. AUDIO and VIDEO:  the patient took 2 restroom breaks.  Patient mumbled during REM sleep  but had no periodic movements.   IMPRESSION: 1) Sleep apnea was not found to be present.      Sleep fragmentation was noted- The majority of sleep arousals was related to spontaneous arousals. Spontaneous arousals are not related to changes in physiological functions measured during a PSG.   RECOMMENDATIONS:  No follow up with the sleep clinic is needed. Results will be made available to the [facility] provider and followed  by the [facility] clinic.    DEDRA GORES, MD            General Information  Name: Anthony Sheppard, Anthony Sheppard BMI: 22.86 Physician: DEDRA GORES, MD  ID: [id number] Height: 61.0 in Technician: Jesusa Haddock, RPSGT  Sex: Male Weight: 121.0 lb Record: [id number]  Age: 55 [birth year]] Date: 06/25/2024    Medical & Medication History    Anthony Sheppard is a 88 y.o. male patient who is seen upon referral on 04/30/2024 from [place] provider Dr Nicholaus. Chief concern according to patient :  I had a sleep study 13 years ago and one before the pandemic, about 8 years ago at the [place]- each time dx with OSA and given a CPAP but he didn't use it- he didn't know how.  I fall asleep easily, no problem, but I have been told I have apnea - I don't feel that I wake up a lot. Just recently I started to go 3-4 times to the bathroom, only in the last few months.  I have the pleasure of seeing a Anthony Sheppard , Anthony Sheppard, on 04/30/24 a right-handed male with a possibly untreated OSA sleep disorder. He served in the [employer], [employer] - over 22 years- [place], [place]. [place], and retired 1984. He was exposed to [employer]  Advil , Aleve , Norvasc, Zyrtec , Proscar, Ditropan, Flomax   Sleep Disorder      Comments   The patient came into the sleep lab for a SPLIT. The patient was not split due to not having an AHI 40 or greater after 2hrs of TST. Two  restroom breaks. The patient did use a urinal. Per the patient when he has to use the restroom he must go at that exact moment. He could not wait to be unhooked. EKG kept in NSR with occasional PVC's. Mild snoring. All sleep stages witnessed. Respiratory events scored with a 4% desat. The patient slept supine and lateral. The patient did have mumbling and movement during REM. AHI was 0.8 after 2hrs of TST.     Lights out: 09:49:47 PM Lights on: 04:59:01 AM   Time Total Supine Side Prone Upright  Recording (TRT) 7h 9.78m 2h 59.49m 4h 10.5m 0h 0.33m 0h 0.59m  Sleep (TST) 5h 4.37m 2h 17.42m 2h  47.23m 0h 0.94m 0h 0.69m   Latency N1 N2 N3 REM Onset Per. Slp. Eff.  Actual 0h 9.41m 0h 12.64m 1h 59.40m 5h 0.59m 0h 9.56m 0h 9.60m 70.86%   Stg Dur Wake N1 N2 N3 REM  Total 95.5 30.0 239.5 19.5 15.0  Supine 34.5 14.0 103.5 19.5 0.0  Side 61.0 16.0 136.0 0.0 15.0  Prone 0.0 0.0 0.0 0.0 0.0  Upright 0.0 0.0 0.0 0.0 0.0   Stg % Wake N1 N2 N3 REM  Total 23.9 9.9 78.8 6.4 4.9  Supine 8.6 4.6 34.0 6.4 0.0  Side 15.3 5.3 44.7 0.0 4.9  Prone 0.0 0.0 0.0 0.0 0.0  Upright 0.0 0.0 0.0 0.0 0.0     Apnea Summary Sub Supine Side Prone Upright  Total 1 Total 1 1 0 0 0    REM 0 0 0 0 0    NREM 1 1 0 0 0  Obs 1 REM 0 0 0 0 0    NREM 1 1 0 0 0  Mix 0 REM 0 0 0 0 0    NREM 0 0 0 0 0  Cen 0 REM 0 0 0 0 0    NREM 0 0 0 0 0   Rera Summary Sub Supine Side Prone Upright  Total 0 Total 0 0 0 0 0    REM 0 0 0 0 0    NREM 0 0 0 0 0   Hypopnea Summary Sub Supine Side Prone Upright  Total 11 Total 11 10 1  0 0    REM 0 0 0 0 0    NREM 11 10 1  0 0   4% Hypopnea Summary Sub Supine Side Prone Upright  Total (4%) 2 Total 2 2 0 0 0    REM 0 0 0 0 0    NREM 2 2 0 0 0     AHI Total Obs Mix Cen  2.37 Apnea 0.20 0.20 0.00 0.00   Hypopnea 2.17 -- -- --  0.59 Hypopnea (4%) 0.39 -- -- --    Total Supine Side Prone Upright  Position AHI 2.37 4.82 0.36 0.00 0.00  REM AHI 0.00   NREM AHI 2.49   Position RDI 2.37 4.82 0.36 0.00  0.00  REM RDI 0.00   NREM RDI 2.49    4% Hypopnea Total Supine Side Prone Upright  Position AHI (4%) 0.59 1.31 0.00 0.00 0.00  REM AHI (4%) 0.00   NREM AHI (4%) 0.62   Position RDI (4%) 0.59 1.31 0.00 0.00 0.00  REM RDI (4%) 0.00   NREM RDI (4%) 0.62    Desaturation Information Threshold: 2% <100% <90% <80% <70% <60% <50% <40%  Supine 43.0 19.0 0.0 0.0 0.0 0.0 0.0  Side 49.0 1.0 0.0 0.0 0.0 0.0 0.0  Prone 0.0 0.0 0.0 0.0 0.0 0.0 0.0  Upright 0.0 0.0 0.0 0.0 0.0 0.0 0.0  Total 92.0 20.0 0.0 0.0 0.0 0.0 0.0  Index 13.8 3.0 0.0 0.0 0.0 0.0 0.0   Threshold: 3% <100% <90% <80% <70% <60% <50% <40%  Supine 26.0 13.0 0.0 0.0 0.0 0.0 0.0  Side 12.0 1.0 0.0 0.0 0.0 0.0 0.0  Prone 0.0 0.0 0.0 0.0 0.0 0.0 0.0  Upright 0.0 0.0 0.0 0.0 0.0 0.0 0.0  Total 38.0 14.0 0.0 0.0 0.0 0.0 0.0  Index 5.7 2.1 0.0 0.0 0.0 0.0 0.0   Threshold: 4% <100% <90% <80% <70% <60% <50% <40%  Supine 11.0 4.0 0.0 0.0 0.0 0.0 0.0  Side 4.0 0.0 0.0 0.0 0.0 0.0 0.0  Prone 0.0 0.0 0.0 0.0 0.0 0.0 0.0  Upright 0.0 0.0 0.0 0.0 0.0 0.0 0.0  Total 15.0 4.0 0.0 0.0 0.0 0.0 0.0  Index 2.3 0.6 0.0 0.0 0.0 0.0 0.0   Threshold: 4% <100% <90% <80% <70% <60% <50% <40%  Supine 11 4 0 0 0 0 0  Side 4 0 0 0 0 0 0  Prone 0 0 0 0 0 0 0  Upright 0 0 0 0 0 0 0  Total 15 4 0 0 0 0 0   Awakening/Arousal Information # of Awakenings 23  Wake after sleep onset 115.31m  Wake after persistent sleep 115.75m   Arousal Assoc. Arousals Index  Apneas 0 0.0  Hypopneas 3 0.6  Leg Movements 0 0.0  Snore 0 0.0  PTT Arousals 0 0.0  Spontaneous 49 9.7  Total 52 10.3  Leg Movement Information PLMS LMs Index  Total LMs during PLMS 0 0.0  LMs w/ Microarousals 0 0.0   LM LMs Index  w/ Microarousal 0 0.0  w/ Awakening 0 0.0  w/ Resp Event 0 0.0  Spontaneous 4 0.8  Total 4 0.8     Desaturation threshold setting: 4% Minimum desaturation setting: 10 seconds SaO2 nadir: 87% The longest event was a 37 sec obstructive Hypopnea with a  minimum SaO2 of 89%. The lowest SaO2 was 88% associated with a 16 sec obstructive Hypopnea. EKG Rates EKG Avg Max Min  Awake 67 96 56  Asleep 59 77 54  EKG Events:

## 2024-07-10 NOTE — Telephone Encounter (Signed)
 Called the God daughter back and confirmed there was nothing else that she needed. I will place a copy of the sleep report in the mail for them.

## 2024-07-10 NOTE — Telephone Encounter (Addendum)
 Pt's GodDaughter has called to speak with Augustin, Charity fundraiser.  Phone rep unable to reach her, Teresa(on DPR) is asking that the info provided to her by Augustin, RN be mailed to her at [facility] [place], [address] [address]

## 2024-07-10 NOTE — Telephone Encounter (Signed)
-----   Message from Scott Dohmeier sent at 07/08/2024  4:37 PM EDT ----- Sleep apnea was not found to be present.      Sleep fragmentation was noted- The majority of sleep arousals was related to spontaneous arousals. Spontaneous arousals are not related to changes in physiological functions measured during a PSG.   RECOMMENDATIONS:  No follow up with the sleep clinic is needed. Results will be made available to the [facility] provider and followed  by the [facility] clinic.    ----- Message ----- From: Chalice Saunas, MD Sent: 07/08/2024   4:36 PM EDT To: Saunas Chalice, MD

## 2024-07-10 NOTE — Telephone Encounter (Signed)
 Called and reviewed the sleep study results with the pt's God daughter that was listed on DPR. Overall she verbalized understanding of the results. Overall there was no questions and she has asked that the results are sent to Dr Jonda.

## 2024-07-17 ENCOUNTER — Ambulatory Visit (HOSPITAL_COMMUNITY)
Admission: RE | Admit: 2024-07-17 | Discharge: 2024-07-17 | Disposition: A | Discharge status: Home / Self Care | Source: Ambulatory Visit | Attending: Physician Assistant | Admitting: Physician Assistant

## 2024-07-17 DIAGNOSIS — C7900 Secondary malignant neoplasm of unspecified kidney and renal pelvis: Secondary | ICD-10-CM | POA: Diagnosis not present

## 2024-07-17 DIAGNOSIS — E042 Nontoxic multinodular goiter: Secondary | ICD-10-CM | POA: Diagnosis not present

## 2024-07-17 DIAGNOSIS — J479 Bronchiectasis, uncomplicated: Secondary | ICD-10-CM | POA: Diagnosis not present

## 2024-07-17 DIAGNOSIS — M5030 Other cervical disc degeneration, unspecified cervical region: Secondary | ICD-10-CM | POA: Diagnosis not present

## 2024-07-17 DIAGNOSIS — C642 Malignant neoplasm of left kidney, except renal pelvis: Secondary | ICD-10-CM | POA: Diagnosis not present

## 2024-07-24 ENCOUNTER — Inpatient Hospital Stay: Attending: Oncology

## 2024-07-24 ENCOUNTER — Inpatient Hospital Stay: Admitting: Internal Medicine

## 2024-07-24 ENCOUNTER — Telehealth: Payer: Self-pay | Admitting: Internal Medicine

## 2024-07-24 VITALS — BP 143/68 | HR 59 | Temp 97.9°F | Resp 17 | Ht 61.0 in | Wt 121.0 lb

## 2024-07-24 DIAGNOSIS — G4730 Sleep apnea, unspecified: Secondary | ICD-10-CM | POA: Insufficient documentation

## 2024-07-24 DIAGNOSIS — C649 Malignant neoplasm of unspecified kidney, except renal pelvis: Secondary | ICD-10-CM | POA: Insufficient documentation

## 2024-07-24 DIAGNOSIS — I10 Essential (primary) hypertension: Secondary | ICD-10-CM | POA: Insufficient documentation

## 2024-07-24 DIAGNOSIS — Z79899 Other long term (current) drug therapy: Secondary | ICD-10-CM | POA: Diagnosis not present

## 2024-07-24 DIAGNOSIS — N400 Enlarged prostate without lower urinary tract symptoms: Secondary | ICD-10-CM | POA: Diagnosis not present

## 2024-07-24 DIAGNOSIS — M4722 Other spondylosis with radiculopathy, cervical region: Secondary | ICD-10-CM | POA: Diagnosis not present

## 2024-07-24 DIAGNOSIS — C642 Malignant neoplasm of left kidney, except renal pelvis: Secondary | ICD-10-CM

## 2024-07-24 DIAGNOSIS — E1141 Type 2 diabetes mellitus with diabetic mononeuropathy: Secondary | ICD-10-CM | POA: Insufficient documentation

## 2024-07-24 DIAGNOSIS — Z9221 Personal history of antineoplastic chemotherapy: Secondary | ICD-10-CM | POA: Insufficient documentation

## 2024-07-24 DIAGNOSIS — Z5112 Encounter for antineoplastic immunotherapy: Secondary | ICD-10-CM | POA: Diagnosis not present

## 2024-07-24 LAB — CBC WITH DIFFERENTIAL (CANCER CENTER ONLY)
Abs Immature Granulocytes: 0 K/uL (ref 0.00–0.07)
Basophils Absolute: 0 K/uL (ref 0.0–0.1)
Basophils Relative: 1 %
Eosinophils Absolute: 0.3 K/uL (ref 0.0–0.5)
Eosinophils Relative: 5 %
HCT: 35.8 % — ABNORMAL LOW (ref 39.0–52.0)
Hemoglobin: 11.7 g/dL — ABNORMAL LOW (ref 13.0–17.0)
Immature Granulocytes: 0 %
Lymphocytes Relative: 13 %
Lymphs Abs: 0.7 K/uL (ref 0.7–4.0)
MCH: 30 pg (ref 26.0–34.0)
MCHC: 32.7 g/dL (ref 30.0–36.0)
MCV: 91.8 fL (ref 80.0–100.0)
Monocytes Absolute: 0.6 K/uL (ref 0.1–1.0)
Monocytes Relative: 11 %
Neutro Abs: 3.7 K/uL (ref 1.7–7.7)
Neutrophils Relative %: 70 %
Platelet Count: 242 K/uL (ref 150–400)
RBC: 3.9 MIL/uL — ABNORMAL LOW (ref 4.22–5.81)
RDW: 12 % (ref 11.5–15.5)
WBC Count: 5.2 K/uL (ref 4.0–10.5)
nRBC: 0 % (ref 0.0–0.2)

## 2024-07-24 LAB — CMP (CANCER CENTER ONLY)
ALT: 8 U/L (ref 0–44)
AST: 12 U/L — ABNORMAL LOW (ref 15–41)
Albumin: 3.7 g/dL (ref 3.5–5.0)
Alkaline Phosphatase: 88 U/L (ref 38–126)
Anion gap: 5 (ref 5–15)
BUN: 21 mg/dL (ref 8–23)
CO2: 27 mmol/L (ref 22–32)
Calcium: 8.6 mg/dL — ABNORMAL LOW (ref 8.9–10.3)
Chloride: 105 mmol/L (ref 98–111)
Creatinine: 0.68 mg/dL (ref 0.61–1.24)
GFR, Estimated: >60 mL/min (ref >60)
Glucose, Bld: 130 mg/dL — ABNORMAL HIGH (ref 70–99)
Potassium: 4.2 mmol/L (ref 3.5–5.1)
Sodium: 137 mmol/L (ref 135–145)
Total Bilirubin: 0.4 mg/dL (ref 0.0–1.2)
Total Protein: 7 g/dL (ref 6.5–8.1)

## 2024-07-24 LAB — TSH: TSH: 0.881 u[IU]/mL (ref 0.350–4.500)

## 2024-07-24 MED ORDER — SODIUM CHLORIDE 0.9 % IV SOLN
480.0000 mg | Freq: Once | INTRAVENOUS | Status: AC
  Administered 2024-07-24: 480 mg via INTRAVENOUS
  Filled 2024-07-24: qty 48

## 2024-07-24 MED ORDER — SODIUM CHLORIDE 0.9 % IV SOLN: Freq: Once | INTRAVENOUS | Status: AC

## 2024-07-24 NOTE — Progress Notes (Signed)
 [facility] [phone]   [phone]  OFFICE PROGRESS NOTE  [name], MD [address] [place] [address]  DIAGNOSIS: Stage IV left intermediate risk clear-cell renal cell carcinoma diagnosed in September 2023 with abdominal adenopathy and pulmonary nodules.  PRIOR THERAPY:  1) Status post left kidney biopsy on August 30, 2022 consistent with renal cell carcinoma. 2) Induction treatment with immunotherapy with ipilimumab  1 Mg/KG and nivolumab  3 mg/KG every 3 weeks started on September 21, 2022 status post 4 cycles with partial response.  CURRENT THERAPY: Maintenance treatment with nivolumab  480 Mg IV every 4 weeks first dose started December 14, 2022.  Status post 20 cycle of the maintenance therapy.  Last dose of treatment was on March 2025.  INTERVAL HISTORY: Anthony Sheppard 88 y.o. male returns to clinic today for follow-up visit accompanied by his god daughter Anthony Sheppard. Discussed the use of AI scribe software for clinical note transcription with the patient, who gave verbal consent to proceed.  History of Present Illness Anthony Sheppard is an 88 year old male with metastatic cancer who presents for restaging with a CT scan. He is accompanied by Anthony Sheppard, his goddaughter.  He has been on maintenance treatment with single-agent nivolumab  every four weeks since December 2023, completing over twenty cycles. He is here for evaluation with a repeat CT scan of the chest, abdomen, and pelvis for restaging of his disease.  He feels generally well except for experiencing numbness in his arm when bending over, which he describes as similar to when 'your arm or something goes to sleep.' The numbness occurs when leaning forward and is relieved by sitting upright. He is cautious about using arthritis medication due to potential effects on his kidneys. No pain is associated with the numbness in his arm.       MEDICAL HISTORY: Past Medical History:   Diagnosis Date   Cancer (HCC)    Diabetes mellitus    Enlarged prostate    Hypertension    Sleep apnea     ALLERGIES:  is allergic to aspirin.  MEDICATIONS:  Current Outpatient Medications  Medication Sig Dispense Refill   ADVIL  200 MG CAPS Take 400 mg by mouth every 6 (six) hours as needed (for mild pain or headaches).     ALEVE  220 MG tablet Take 220-440 mg by mouth 2 (two) times daily as needed (for mild pain or headaches).     amLODipine (NORVASC) 5 MG tablet Take 5 mg by mouth daily.     cetirizine  (ZYRTEC ) 10 MG tablet Take 10 mg by mouth at bedtime.     finasteride (PROSCAR) 5 MG tablet Take 5 mg by mouth at bedtime.     oxybutynin (DITROPAN) 5 MG tablet Take 5 mg by mouth daily.     tamsulosin (FLOMAX) 0.4 MG CAPS capsule Take 0.4 mg by mouth at bedtime.      No current facility-administered medications for this visit.    SURGICAL HISTORY: No past surgical history on file.  REVIEW OF SYSTEMS:  Constitutional: positive for fatigue Eyes: negative Ears, nose, mouth, throat, and face: negative Respiratory: negative Cardiovascular: negative Gastrointestinal: negative Genitourinary:negative Integument/breast: negative Hematologic/lymphatic: negative Musculoskeletal:positive for neck pain Neurological: negative Behavioral/Psych: negative Endocrine: negative Allergic/Immunologic: negative   PHYSICAL EXAMINATION: General appearance: alert, cooperative, and no distress Head: Normocephalic, without obvious abnormality, atraumatic Neck: no adenopathy, no JVD, supple, symmetrical, trachea midline, and thyroid  not enlarged, symmetric, no tenderness/mass/nodules Lymph nodes: Cervical, supraclavicular, and axillary nodes normal.  Resp: clear to auscultation bilaterally Back: symmetric, no curvature. ROM normal. No CVA tenderness. Cardio: regular rate and rhythm, S1, S2 normal, no murmur, click, rub or gallop GI: soft, non-tender; bowel sounds normal; no masses,  no  organomegaly Extremities: extremities normal, atraumatic, no cyanosis or edema Neurologic: Alert and oriented X 3, normal strength and tone. Normal symmetric reflexes. Normal coordination and gait  ECOG PERFORMANCE STATUS: 1 - Symptomatic but completely ambulatory  Blood pressure (!) 143/68, pulse (!) 59, temperature 97.9 F (36.6 C), temperature source Temporal, resp. rate 17, height 5' 1 (1.549 m), weight 121 lb (54.9 kg), SpO2 99%.  LABORATORY DATA: Lab Results  Component Value Date   WBC 5.2 07/24/2024   HGB 11.7 (L) 07/24/2024   HCT 35.8 (L) 07/24/2024   MCV 91.8 07/24/2024   PLT 242 07/24/2024      Chemistry      Component Value Date/Time   NA 138 06/26/2024 1040   K 4.0 06/26/2024 1040   CL 107 06/26/2024 1040   CO2 25 06/26/2024 1040   BUN 20 06/26/2024 1040   CREATININE 0.71 06/26/2024 1040      Component Value Date/Time   CALCIUM 8.7 (L) 06/26/2024 1040   ALKPHOS 83 06/26/2024 1040   AST 11 (L) 06/26/2024 1040   ALT 7 06/26/2024 1040   BILITOT 0.5 06/26/2024 1040       RADIOGRAPHIC STUDIES: CT CHEST ABDOMEN PELVIS WO CONTRAST Result Date: 07/23/2024 EXAM: CT CHEST, ABDOMEN AND PELVIS WITHOUT CONTRAST 07/17/2024 12:37:45 PM TECHNIQUE: CT of the chest, abdomen and pelvis was performed without the administration of intravenous contrast. Multiplanar reformatted images are provided for review. Automated exposure control, iterative reconstruction, and/or weight based adjustment of the mA/kV was utilized to reduce the radiation dose to as low as reasonably achievable. COMPARISON: CTs of the chest, abdomen and pelvis dated 03/27/2024. CLINICAL HISTORY: Metastatic disease evaluation. Metastatic disease evaluation, Neck pain and numbness. has renal cell carcinoma. Ensure no etiology for neck pain. Primary malignant neoplasm of left kidney with metastasis from kidney to other site. FINDINGS: CHEST: MEDIASTINUM: The thyroid  is again noted to be nodular and prominent. There are  few shotty mediastinal lymph nodes present. THORACIC LYMPH NODES: Few shotty mediastinal lymph nodes present. LUNGS AND PLEURA: There is mild atelectasis present independently within the lower lobes. There is also mild bronchiectasis present posteriorly within the lower lobes bilaterally. ABDOMEN AND PELVIS: LIVER: There is a simple-appearing cyst present posteriorly within the right hepatic lobe measuring approximately 2.8 cm in diameter. GALLBLADDER AND BILE DUCTS: The gallbladder is contracted. SPLEEN: No acute abnormality. PANCREAS: No acute abnormality. ADRENAL GLANDS: No acute abnormality. KIDNEYS, URETERS AND BLADDER: No stones in the kidneys or ureters. No hydronephrosis. No perinephric or periureteral stranding. Urinary bladder is unremarkable. GI AND BOWEL: Stomach demonstrates no acute abnormality. There is no bowel obstruction. REPRODUCTIVE ORGANS: The prostate gland is moderately enlarged, measuring approximately 6.5 cm in diameter. PERITONEUM AND RETROPERITONEUM: No ascites. No free air. VASCULATURE: There is mild calcific coronary artery disease present. ABDOMINAL AND PELVIS LYMPH NODES: No lymphadenopathy. REPRODUCTIVE ORGANS: The prostate gland is moderately enlarged, measuring approximately 6.5 cm in diameter. BONES AND SOFT TISSUES: There are mild degenerative changes throughout the thoracolumbar spine. There is extensive diffuse degenerative disc disease. There are no osseous lesions present. There is grade 1 degenerative anterolisthesis at L4-5. There is a prominent schmorl's node within the superior endplate of L1 with loss of the vertebral body height posteriorly. There is degenerative disc disease and facet  arthrosis at L5-S1 with slight anterolisthesis. There is a focal sclerotic density in the left ischium which likely represents bone island. IMPRESSION: 1. No acute findings in the chest, abdomen, and pelvis related to the clinical history of metastatic disease evaluation and neck  pain/numbness. Electronically signed by: evalene coho 07/23/2024 02:11 PM EDT RP Workstation: [id number]   CT Soft Tissue Neck Wo Contrast Result Date: 07/23/2024 EXAM: CT NECK WITHOUT CONTRAST 07/17/2024 12:37:45 PM TECHNIQUE: CT of the neck was performed without the administration of intravenous contrast. Multiplanar reformatted images are provided for review. Automated exposure control, iterative reconstruction, and/or weight based adjustment of the mA/kV was utilized to reduce the radiation dose to as low as reasonably achievable. COMPARISON: PET CT fusion study dated 12/02/2022. The actual study is not currently available for comparison. CLINICAL HISTORY: Metastatic disease evaluation; Neck pain and numbness. Has renal cell carcinoma. Ensure no etiology for neck pain. Primary malignant neoplasm of left kidney with metastasis from kidney to other site. FINDINGS: AERODIGESTIVE TRACT: No discrete mass. No edema. SALIVARY GLANDS: The parotid and submandibular glands are unremarkable. THYROID : There is multinodular goiter of the left lobe of the thyroid , which measures approximately 4.9 cm in length. There is a hypodense nodule within the inferior pole of the right lobe measuring about 2.2 cm in diameter. LYMPH NODES: No suspicious cervical lymphadenopathy. SOFT TISSUES: No mass or fluid collection. BRAIN, ORBITS, SINUSES AND MASTOIDS: No acute abnormality. LUNGS AND MEDIASTINUM: No acute abnormality. BONES: There is extensive degenerative disc disease throughout the cervical spine, with reversal of the normal cervical lordosis. There is pronounced thickening of the transverse ligament, particularly on the right. There is also diffuse mineralization of it. It measures up to 13 mm in AP diameter and is causing severe right-sided spinal canal stenosis and compression of the spinal cord. There are small cysts in the base of the dens. IMPRESSION: 1. Pronounced thickening and diffuse mineralization of the  transverse ligament, particularly on the right, measuring up to 13 mm in AP diameter, causing severe right-sided spinal canal stenosis and compression of the spinal cord. Small cysts in the base of the dens. This most likely represents calcium pyrophosphate deposition disease. Metastatic disease is considered highly unlikely, but correlation with an MRI of the cervical spine without and with gadolinium contrast is recommended for more definitive assessment. 2. Multinodular goiter of the left lobe of the thyroid , measuring approximately 4.9 cm in length. Hypodense nodule within the inferior pole of the right lobe, measuring about 2.2 cm in diameter. Correlation with a thyroid  ultrasound is recommended. 3. Extensive degenerative disc disease throughout the cervical spine, with reversal of the normal cervical lordosis. Electronically signed by: evalene coho 07/23/2024 01:28 PM EDT RP Workstation: [id number]   Split night study Result Date: 06/25/2024 Chalice Saunas, MD     07/08/2024  4:36 PM  [facility] POLYSOMNOGRAPHY  INTERPRETATION REPORT STUDY DATE:  06/25/2024  PATIENT NAME:  Anthony Sheppard        DATE OF BIRTH:  September 24, 1936 PATIENT ID:  [id number]    TYPE OF STUDY:  PSG READING PHYSICIAN: SAUNAS CHALICE, MD REFERRED BY:  [facility]  Dr Nicholaus.  SCORING TECHNICIAN: Jesusa Haddock, RPSGT HISTORY:  This 88 year-old Male patient was seen on  04-30-2024 and has a chief complaint of a previous OSA dx but never used a prescribed CPAP at the time ( about 8 years ago ) and current unexplained weight loss,  fatigue and sleepiness.  Suspected to  reflect sleep apnea symptoms. This patient was exposed to agent Orange, he reported.   Past medical history : Cancer patient with history of  chemotherapy , which may explain the weight loss and fatigue ADDITIONAL INFORMATION:  The Epworth Sleepiness Scale endorsed at  22 /24 points (scores above or equal to 10 are suggestive  of hypersomnolence).  FSS endorsed at   /63 points.  Height: 61 in Weight: 121 lbs (BMI 22) Neck Size: 14 in MEDICATIONS: Advil , Aleve , Norvasc, Zyrtec , Proscar, Ditropan, Flomax  TECHNICAL DESCRIPTION: A registered sleep technologist ( RPSGT)  was in attendance for the duration of the recording.  Data collection, scoring, video monitoring, and reporting were performed in compliance with the AASM Manual for the Scoring of Sleep and Associated Events; (Hypopnea is scored based on the criteria listed in Section VIII D. 1b in the AASM Manual V2.6 using a 4% oxygen desaturation rule or Hypopnea is scored based on the criteria listed in Section VIII D. 1a in the AASM Manual V2.6 using 3% oxygen desaturation and /or arousal rule). SLEEP CONTINUITY AND SLEEP ARCHITECTURE:  Lights-out was at 21:49: and lights-on at  04:59:, with  7.2  Hours of recording time . Total sleep time ( TST) was 304.0 minutes with a decreased sleep efficiency at 70.9%.  Sleep latency was normal at 9.5 minutes.  REM sleep latency was increased at 300.5 minutes. Of the total sleep time, the percentage of stage N1 sleep was 9.9%, stage N2 sleep was 79%, stage N3 sleep was 6.4%, and REM sleep was 4.9%.  There were 1 Stage R periods observed on this study night, 23 awakenings (i.e. transitions to Stage W from any sleep stage), and 81 total stage transitions. Wake after sleep onset (WASO) time accounted for 115 minutes . BODY POSITION:  TST was divided  between the following sleep positions as follows: supine 137 minutes (45%), non-supine 167 minutes (55%); divided into :  right 66 minutes (22%), left 100 minutes (33%), and prone 00 minutes (0%). Total supine REM sleep time was 00 minutes (0% of total REM sleep). RESPIRATORY MONITORING: [facility] patient :  Based on CMS criteria (using a 4% oxygen desaturation rule for scoring hypopneas), there were 1 apneas (1 obstructive; 0 central; 0 mixed), and 2 hypopneas.  Apnea index was 0.2. Hypopnea index was 0.4. The  AHI, or apnea-hypopnea index was 0.6/h  overall (1.3 supine, 0 non-supine; 0.0 REM, 0.0 supine REM).  There were 0 respiratory effort-related arousals (RERAs) OXIMETRY: Oxyhemoglobin Saturation Nadir during sleep was at  87% from a mean of 91%.  Total sleep time (TST) in hypoxemia (<89%) was  for  2.4 minutes, or 0.8% of total sleep time. LIMB MOVEMENTS: There were 0 periodic limb movements of sleep (0.0/hr), of which 0 (0.0/hr) were associated with an arousal. AROUSAL: There were 52 arousals in total, for an arousal index of 8 arousals/hour.  Of these, 3 were identified as respiratory-related arousals (1 /h), 0 were PLM-related arousals (0 /h), and 49 were non-specific arousals (10 /h). EEG:  PSG EEG was of low amplitude and  normal frequency, with symmetric manifestation of sleep stages. EKG: The electrocardiogram documented NSR with PVCs. (Epoch 641).  The average heart rate during sleep was 59 bpm.  The heart rate during sleep varied between a minimum of 54 bpm and  a maximum of  77 bpm. AUDIO and VIDEO:  the patient took 2 restroom breaks.  Patient mumbled during REM sleep  but had no periodic movements. IMPRESSION: 1)  Sleep apnea was not found to be present.    Sleep fragmentation was noted- The majority of sleep arousals was related to spontaneous arousals. Spontaneous arousals are not related to changes in physiological functions measured during a PSG. RECOMMENDATIONS:  No follow up with the sleep clinic is needed. Results will be made available to the [facility] [place] provider and followed  by the [facility] clinic. DEDRA GORES, MD   General Information Name: Raymel, Cull BMI: 22.86 Physician: DEDRA GORES, MD ID: [id number] Height: 61.0 in Technician: Jesusa Haddock, RPSGT Sex: Male Weight: 121.0 lb Record: [id number] Age: 69 [[birth year]] Date: 06/25/2024   Medical & Medication History   Raman Featherston is a 88 y.o. male patient who is seen upon referral on 04/30/2024 from [place] provider Dr Nicholaus. Chief concern  according to patient :  I had a sleep study 13 years ago and one before the pandemic, about 8 years ago at the [place]- each time dx with OSA and given a CPAP but he didn't use it- he didn't know how.  I fall asleep easily, no problem, but I have been told I have apnea - I don't feel that I wake up a lot. Just recently I started to go 3-4 times to the bathroom, only in the last few months.  I have the pleasure of seeing a [place] , Mr Zade Falkner, on 04/30/24 a right-handed male with a possibly untreated OSA sleep disorder. He served in the [employer], [employer] - over 22 years- [place], [place]. [place], and retired 1984. He was exposed to [employer] Advil , Aleve , Norvasc, Zyrtec , Proscar, Ditropan, Flomax  Sleep Disorder    Comments  The patient came into the sleep lab for a SPLIT. The patient was not split due to not having an AHI 40 or greater after 2hrs of TST. Two restroom breaks. The patient did use a urinal. Per the patient when he has to use the restroom he must go at that exact moment. He could not wait to be unhooked. EKG kept in NSR with occasional PVC's. Mild snoring. All sleep stages witnessed. Respiratory events scored with a 4% desat. The patient slept supine and lateral. The patient did have mumbling and movement during REM. AHI was 0.8 after 2hrs of TST.   Lights out: 09:49:47 PM Lights on: 04:59:01 AM Time Total Supine Side Prone Upright Recording (TRT) 7h 9.33m 2h 59.24m 4h 10.8m 0h 0.55m 0h 0.39m Sleep (TST) 5h 4.67m 2h 17.37m 2h 47.60m 0h 0.48m 0h 0.59m Latency N1 N2 N3 REM Onset Per. Slp. Eff. Actual 0h 9.64m 0h 12.54m 1h 59.64m 5h 0.25m 0h 9.59m 0h 9.58m 70.86% Stg Dur Wake N1 N2 N3 REM Total 95.5 30.0 239.5 19.5 15.0 Supine 34.5 14.0 103.5 19.5 0.0 Side 61.0 16.0 136.0 0.0 15.0 Prone 0.0 0.0 0.0 0.0 0.0 Upright 0.0 0.0 0.0 0.0 0.0  Stg % Wake N1 N2 N3 REM Total 23.9 9.9 78.8 6.4 4.9 Supine 8.6 4.6 34.0 6.4 0.0 Side 15.3 5.3 44.7 0.0 4.9 Prone 0.0 0.0 0.0 0.0 0.0 Upright 0.0 0.0 0.0 0.0 0.0  Apnea Summary  Sub Supine Side Prone Upright Total 1 Total 1 1 0 0 0   REM 0 0 0 0 0   NREM 1 1 0 0 0 Obs 1 REM 0 0 0 0 0   NREM 1 1 0 0 0 Mix 0 REM 0 0 0 0 0   NREM 0 0 0 0 0 Cen 0 REM 0 0 0 0 0   NREM  0 0 0 0 0 Rera Summary Sub Supine Side Prone Upright Total 0 Total 0 0 0 0 0   REM 0 0 0 0 0   NREM 0 0 0 0 0  Hypopnea Summary Sub Supine Side Prone Upright Total 11 Total 11 10 1  0 0   REM 0 0 0 0 0   NREM 11 10 1  0 0 4% Hypopnea Summary Sub Supine Side Prone Upright Total (4%) 2 Total 2 2 0 0 0   REM 0 0 0 0 0   NREM 2 2 0 0 0  AHI Total Obs Mix Cen 2.37 Apnea 0.20 0.20 0.00 0.00  Hypopnea 2.17 -- -- -- 0.59 Hypopnea (4%) 0.39 -- -- --  Total Supine Side Prone Upright Position AHI 2.37 4.82 0.36 0.00 0.00 REM AHI 0.00  NREM AHI 2.49  Position RDI 2.37 4.82 0.36 0.00 0.00 REM RDI 0.00  NREM RDI 2.49  4% Hypopnea Total Supine Side Prone Upright Position AHI (4%) 0.59 1.31 0.00 0.00 0.00 REM AHI (4%) 0.00  NREM AHI (4%) 0.62  Position RDI (4%) 0.59 1.31 0.00 0.00 0.00 REM RDI (4%) 0.00  NREM RDI (4%) 0.62  Desaturation Information Threshold: 2% <100% <90% <80% <70% <60% <50% <40% Supine 43.0 19.0 0.0 0.0 0.0 0.0 0.0 Side 49.0 1.0 0.0 0.0 0.0 0.0 0.0 Prone 0.0 0.0 0.0 0.0 0.0 0.0 0.0 Upright 0.0 0.0 0.0 0.0 0.0 0.0 0.0 Total 92.0 20.0 0.0 0.0 0.0 0.0 0.0 Index 13.8 3.0 0.0 0.0 0.0 0.0 0.0 Threshold: 3% <100% <90% <80% <70% <60% <50% <40% Supine 26.0 13.0 0.0 0.0 0.0 0.0 0.0 Side 12.0 1.0 0.0 0.0 0.0 0.0 0.0 Prone 0.0 0.0 0.0 0.0 0.0 0.0 0.0 Upright 0.0 0.0 0.0 0.0 0.0 0.0 0.0 Total 38.0 14.0 0.0 0.0 0.0 0.0 0.0 Index 5.7 2.1 0.0 0.0 0.0 0.0 0.0 Threshold: 4% <100% <90% <80% <70% <60% <50% <40% Supine 11.0 4.0 0.0 0.0 0.0 0.0 0.0 Side 4.0 0.0 0.0 0.0 0.0 0.0 0.0 Prone 0.0 0.0 0.0 0.0 0.0 0.0 0.0 Upright 0.0 0.0 0.0 0.0 0.0 0.0 0.0 Total 15.0 4.0 0.0 0.0 0.0 0.0 0.0 Index 2.3 0.6 0.0 0.0 0.0 0.0 0.0 Threshold: 4% <100% <90% <80% <70% <60% <50% <40% Supine 11 4 0 0 0 0 0 Side 4 0 0 0 0 0 0 Prone 0 0 0 0 0 0 0 Upright 0 0 0 0 0 0 0  Total 15 4 0 0 0 0 0  Awakening/Arousal Information # of Awakenings 23 Wake after sleep onset 115.60m Wake after persistent sleep 115.38m Arousal Assoc. Arousals Index Apneas 0 0.0 Hypopneas 3 0.6 Leg Movements 0 0.0 Snore 0 0.0 PTT Arousals 0 0.0 Spontaneous 49 9.7 Total 52 10.3 Leg Movement Information PLMS LMs Index Total LMs during PLMS 0 0.0 LMs w/ Microarousals 0 0.0 LM LMs Index w/ Microarousal 0 0.0 w/ Awakening 0 0.0 w/ Resp Event 0 0.0 Spontaneous 4 0.8 Total 4 0.8  Desaturation threshold setting: 4% Minimum desaturation setting: 10 seconds SaO2 nadir: 87% The longest event was a 37 sec obstructive Hypopnea with a minimum SaO2 of 89%. The lowest SaO2 was 88% associated with a 16 sec obstructive Hypopnea. EKG Rates EKG Avg Max Min Awake 67 96 56 Asleep 59 77 54 EKG Events:    ASSESSMENT AND PLAN: This is a very pleasant 88 years old African-American male with a stage IV intermediate risk clear-cell carcinoma diagnosed in September 2023 status post 4 cycles of  induction treatment with immunotherapy with ipilimumab  and nivolumab  and starting from cycle #5 he is currently on maintenance treatment with nivolumab  480 mg IV every 4 weeks status post total of 24 cycles.   He had repeat CT scan of the neck, chest, abdomen and pelvis performed recently.  I personally independently reviewed the scan and discussed the result with the patient and his Daughter.  His scan showed no concerning findings for disease progression. Assessment and Plan Assessment & Plan Metastatic melanoma, status post nivolumab  maintenance He has been on maintenance treatment with single agent nivolumab  every four weeks since December 2023, completing 20 cycles. Recent CT scan of the chest, abdomen, and pelvis shows no evidence of disease progression, indicating that the disease is under control. This cycle marks the completion of his two-year treatment plan. - Administer final cycle of nivolumab  today - Cancel all future nivolumab   treatments - Schedule follow-up appointment in three months with repeat CT scan  Cervical spondylosis with radiculopathy He experiences numbness in the arm when bending over, attributed to cervical spondylosis with radiculopathy due to degenerative disc disease in the cervical spine causing nerve compression. No mass was observed on the neck scan. - Advise to avoid maneuvers that exacerbate symptoms - Avoid arthritis medication due to potential kidney effects He was advised to call immediately if he has any concerning symptoms in the interval.  The patient voices understanding of current disease status and treatment options and is in agreement with the current care plan.  All questions were answered. The patient knows to call the clinic with any problems, questions or concerns. We can certainly see the patient much sooner if necessary.  The total time spent in the appointment was 30 minutes.  Disclaimer: This note was dictated with voice recognition software. Similar sounding words can inadvertently be transcribed and may not be corrected upon review.

## 2024-07-24 NOTE — Patient Instructions (Signed)
 [facility]  Discharge Instructions: Thank you for choosing [facility] to provide your oncology and hematology care.   If you have a lab appointment with the Cancer Center, please go directly to the Cancer Center and check in at the registration area.   Wear comfortable clothing and clothing appropriate for easy access to any Portacath or PICC line.   We strive to give you quality time with your provider. You may need to reschedule your appointment if you arrive late (15 or more minutes).  Arriving late affects you and other patients whose appointments are after yours.  Also, if you miss three or more appointments without notifying the office, you may be dismissed from the clinic at the provider's discretion.      For prescription refill requests, have your pharmacy contact our office and allow 72 hours for refills to be completed.    Today you received the following chemotherapy and/or immunotherapy agents opdivo      To help prevent nausea and vomiting after your treatment, we encourage you to take your nausea medication as directed.  BELOW ARE SYMPTOMS THAT SHOULD BE REPORTED IMMEDIATELY: *FEVER GREATER THAN 100.4 F (38 C) OR HIGHER *CHILLS OR SWEATING *NAUSEA AND VOMITING THAT IS NOT CONTROLLED WITH YOUR NAUSEA MEDICATION *UNUSUAL SHORTNESS OF BREATH *UNUSUAL BRUISING OR BLEEDING *URINARY PROBLEMS (pain or burning when urinating, or frequent urination) *BOWEL PROBLEMS (unusual diarrhea, constipation, pain near the anus) TENDERNESS IN MOUTH AND THROAT WITH OR WITHOUT PRESENCE OF ULCERS (sore throat, sores in mouth, or a toothache) UNUSUAL RASH, SWELLING OR PAIN  UNUSUAL VAGINAL DISCHARGE OR ITCHING   Items with * indicate a potential emergency and should be followed up as soon as possible or go to the Emergency Department if any problems should occur.  Please show the CHEMOTHERAPY ALERT CARD or IMMUNOTHERAPY  ALERT CARD at check-in to the Emergency Department and triage nurse.  Should you have questions after your visit or need to cancel or reschedule your appointment, please contact [facility] - [facility]  Dept: [phone]  and follow the prompts.  Office hours are 8:00 a.m. to 4:30 p.m. Monday - Friday. Please note that voicemails left after 4:00 p.m. may not be returned until the following business day.  We are closed weekends and major holidays. You have access to a nurse at all times for urgent questions. Please call the main number to the clinic Dept: [phone] and follow the prompts.   For any non-urgent questions, you may also contact your provider using MyChart. We now offer e-Visits for anyone 74 and older to request care online for non-urgent symptoms. For details visit [email or web address].   Also download the MyChart app! Go to the app store, search "MyChart", open the app, select [place], and log in with your MyChart username and password.

## 2024-07-25 LAB — T4: T4, Total: 5.6 ug/dL (ref 4.5–12.0)

## 2024-07-26 ENCOUNTER — Other Ambulatory Visit: Payer: Self-pay

## 2024-08-21 ENCOUNTER — Encounter: Payer: Self-pay | Admitting: Internal Medicine

## 2024-08-21 ENCOUNTER — Ambulatory Visit

## 2024-08-21 ENCOUNTER — Encounter (HOSPITAL_COMMUNITY): Payer: Self-pay

## 2024-08-21 ENCOUNTER — Other Ambulatory Visit: Payer: Self-pay

## 2024-08-21 ENCOUNTER — Other Ambulatory Visit

## 2024-08-21 ENCOUNTER — Ambulatory Visit: Admitting: Internal Medicine

## 2024-08-21 ENCOUNTER — Emergency Department (HOSPITAL_COMMUNITY): Admission: EM | Admit: 2024-08-21 | Discharge: 2024-08-21 | Disposition: A | Discharge status: Home / Self Care

## 2024-08-21 DIAGNOSIS — Z85528 Personal history of other malignant neoplasm of kidney: Secondary | ICD-10-CM | POA: Insufficient documentation

## 2024-08-21 DIAGNOSIS — M542 Cervicalgia: Secondary | ICD-10-CM | POA: Insufficient documentation

## 2024-08-21 DIAGNOSIS — E119 Type 2 diabetes mellitus without complications: Secondary | ICD-10-CM | POA: Insufficient documentation

## 2024-08-21 DIAGNOSIS — I10 Essential (primary) hypertension: Secondary | ICD-10-CM | POA: Diagnosis not present

## 2024-08-21 DIAGNOSIS — R202 Paresthesia of skin: Secondary | ICD-10-CM | POA: Insufficient documentation

## 2024-08-21 DIAGNOSIS — Z79899 Other long term (current) drug therapy: Secondary | ICD-10-CM | POA: Diagnosis not present

## 2024-08-21 NOTE — ED Provider Notes (Signed)
 [place] EMERGENCY DEPARTMENT AT [facility] Provider Note   CSN: [id number] Arrival date & time: 08/21/24  1349     Patient presents with: Numbness   Anthony Sheppard is a 88 y.o. male.  HPI  Anthony Sheppard , a 88 y.o. male.  Pt complains of tingling sensations from R side posterior neck radiating to forehead that happens when he is looking down to read his book, goes away when standing up.  This been ongoing for the last month.  Does not notice this regularly, happening only intermittently.  Symptoms are currently not present at this time.   Hx of kidney cancer, finished chemotherapy 1 month ago.    Denies fever, headache, vision changes, unilateral weakness, blurry vision, diplopia, vertigo, hearing loss, sudden weight loss, chest pain, shortness of breath, abdominal pain, diarrhea, n/v, dysuria, lower legs.       Prior to Admission medications   Medication Sig Start Date End Date Taking? Authorizing Provider  ADVIL  200 MG CAPS Take 400 mg by mouth every 6 (six) hours as needed (for mild pain or headaches).    [provider]  ALEVE  220 MG tablet Take 220-440 mg by mouth 2 (two) times daily as needed (for mild pain or headaches).    [provider]  amLODipine (NORVASC) 5 MG tablet Take 5 mg by mouth daily.    [provider]  cetirizine  (ZYRTEC ) 10 MG tablet Take 10 mg by mouth at bedtime. 01/11/22   [provider]  finasteride (PROSCAR) 5 MG tablet Take 5 mg by mouth at bedtime.    [provider]  oxybutynin (DITROPAN) 5 MG tablet Take 5 mg by mouth daily. 04/06/24   [provider]  tamsulosin (FLOMAX) 0.4 MG CAPS capsule Take 0.4 mg by mouth at bedtime.     [provider]    Allergies: Aspirin    Review of Systems  Neurological:        Tingling sensations  All other systems reviewed and are negative.   Updated Vital Signs BP 117/69 (BP Location: Left Arm)   Pulse 63   Temp 97.8 F (36.6 C)  (Oral)   Resp 16   Ht 5' 1 (1.549 m)   Wt 54.4 kg   SpO2 98%   BMI 22.67 kg/m   Physical Exam Vitals and nursing note reviewed.  Constitutional:      General: He is not in acute distress.    Appearance: Normal appearance. He is not ill-appearing or diaphoretic.  HENT:     Head: Normocephalic and atraumatic.  Eyes:     General: No scleral icterus.       Right eye: No discharge.        Left eye: No discharge.     Extraocular Movements: Extraocular movements intact.     Conjunctiva/sclera: Conjunctivae normal.     Pupils: Pupils are equal, round, and reactive to light.  Cardiovascular:     Rate and Rhythm: Normal rate and regular rhythm.     Pulses: Normal pulses.     Heart sounds: Normal heart sounds. No murmur heard.    No friction rub. No gallop.  Pulmonary:     Effort: Pulmonary effort is normal. No respiratory distress.     Breath sounds: No stridor. No wheezing, rhonchi or rales.  Chest:     Chest wall: No tenderness.  Abdominal:     General: Abdomen is flat. There is no distension.     Palpations: Abdomen is soft.  Tenderness: There is no abdominal tenderness. There is no right CVA tenderness, left CVA tenderness, guarding or rebound.  Musculoskeletal:        General: No swelling, deformity or signs of injury.     Cervical back: Normal range of motion. Tenderness (Mild right sided paraspinal muscle tenderness noted to the cervical spine of the trapezius muscle) present. No rigidity.     Right lower leg: No edema.     Left lower leg: No edema.  Skin:    General: Skin is warm and dry.     Findings: No bruising, erythema or lesion.  Neurological:     General: No focal deficit present.     Mental Status: He is alert and oriented to person, place, and time. Mental status is at baseline.     Cranial Nerves: No cranial nerve deficit.     Sensory: No sensory deficit.     Motor: No weakness.     Comments: No facial asymmetry, no ataxia, no apraxia, no aphasia, no arm  drift, normal coordination with finger-to-nose, normal sensation to both upper and lower extremities bilaterally, normal grip strength bilaterally, normal strength to both flexion and extension to both upper lower extremities 5+ bilaterally, no visual field deficits, no nystagmus.   Psychiatric:        Mood and Affect: Mood normal.     (all labs ordered are listed, but only abnormal results are displayed) Labs Reviewed - No data to display  EKG: None  Radiology: No results found.   Procedures   Medications Ordered in the ED - No data to display                               Medical Decision Making  This patient is a 88 year old male who presents to the ED for concern of right sided tingling sensations noted from his right posterior neck forehead that happened when he looks down for extended periods while reading his book, happening only intermittently and is currently not present at this time.  Notes that this is not painful and does not have any other associated symptoms with this.  No other complaint at this time.  On physical exam, patient is in no acute distress, afebrile, alert and orient x 4, speaking in full sentences, nontachypneic, nontachycardic.  Normal neuroexam.  LCTAB, RRR, no murmur, no lower leg edema.  Patient has no abdominal tenderness to palpation.  Normal ROM at cervical spine.  Noted to have some mild paraspinal muscle tenderness noted to right side over the trapezius muscles.  Exam is otherwise unremarkable.  Noted to have reassuring workup 2 months ago for headache, currently not undergoing or experiencing any other symptoms/headache.  With very reassuring physical exam, no symptoms at this time, we will have him continue to follow-up with PCP and using Tylenol  or any other symptomatic management over-the-counter for trapezius muscle soreness.  Will have him continue to follow-up with PCP for any present symptoms and return to the ED for any new or worsening  symptoms.  Patient vital signs have remained stable throughout the course of patient's time in the ED. Low suspicion for any other emergent pathology at this time. I believe this patient is safe to be discharged. Provided strict return to ER precautions. Patient expressed agreement and understanding of plan. All questions were answered.  Differential diagnoses prior to evaluation: The emergent differential diagnosis includes, but is not limited to, CVA, muscle strain,  neuropathy, radiculopathy, myopathy, shingles. This is not an exhaustive differential.   Past Medical History / Co-morbidities / Social History: Kidney cancer, HTN, diabetes  Additional history: Chart reviewed. Pertinent results include:  Last chemotherapy infusion was on 07/24/2024.  Patient was noted to be seen in the ED on 06/18/2024 for posterior headache.  CT scans done at that time and reassuring workup and physical exam.    Medications:  I have reviewed the patients home medicines and have made adjustments as needed.  Critical Interventions: None  Social Determinants of Health: Has good follow-up with PCP  Disposition: After consideration of the diagnostic results and the patients response to treatment, I feel that the patient would benefit from discharge and treatment as above.   emergency department workup does not suggest an emergent condition requiring admission or immediate intervention beyond what has been performed at this time. The plan is: Follow-up with PCP for any persistent symptoms, symptomatic management at home. The patient is safe for discharge and has been instructed to return immediately for worsening symptoms, change in symptoms or any other concerns.   Final diagnoses:  Tingling sensation    ED Discharge Orders     None          [name], PA-C 08/21/24 1523    Neysa Caron PARAS, DO 08/22/24 [phone]

## 2024-08-21 NOTE — ED Triage Notes (Addendum)
 Pt presents to ED from home C/O when I bend over and touch my toes, my R foot seems to fall asleep. When I stand up the feeling comes back. Denies current numbness, tingling, CP, SOB, weakness.

## 2024-08-21 NOTE — ED Provider Triage Note (Signed)
 Emergency Medicine Provider Triage Evaluation Note  Anthony Sheppard , a 88 y.o. male  was evaluated in triage.  Pt complains of tingling sensations from R side posterior neck radiating to forehead that happens when he is looking down to read his book, goes away when standing up.  Hx of kidney cancer, finished chemotherapy 1 month ago.   Denies fever, headache, vision changes, blurry vision, diplopia, vertigo, hearing loss, sudden weight loss, chest pain, shortness of breath, abdominal pain, diarrhea, n/v, dysuria, lower legs.   Review of Systems  Positive: N/a Negative: N/a  Physical Exam  BP 117/69 (BP Location: Left Arm)   Pulse 63   Temp 97.8 F (36.6 C) (Oral)   Resp 16   Ht 5' 1 (1.549 m)   Wt 54.4 kg   SpO2 98%   BMI 22.67 kg/m  Gen:   Awake, no distress   Resp:  Normal effort  MSK:   Moves extremities without difficulty  Other:    Medical Decision Making  Medically screening exam initiated at 3:03 PM.  Appropriate orders placed.  Anthony Sheppard was informed that the remainder of the evaluation will be completed by another provider, this initial triage assessment does not replace that evaluation, and the importance of remaining in the ED until their evaluation is complete.     Anthony Sheppard Anthony Sheppard, [place] 08/22/24 0001

## 2024-08-21 NOTE — Discharge Instructions (Signed)
 You were seen today for tingling sensations from the back of your neck to your forehead that happened when you read a book.  Recommend you continue to do the gentle neck exercises that we had discussed as well as following up with your PCP if this persists.  Return to the ED if any new or worsening symptoms present themselves.

## 2024-08-23 ENCOUNTER — Telehealth: Payer: Self-pay | Admitting: Medical Oncology

## 2024-08-23 NOTE — Telephone Encounter (Signed)
 Pain management-Anthony Sheppard is asking for  medicine for pt neck pain ( cervical spondylosis). I told her that Dr. Sherrod gave him instructions at his last visit to to  avoid maneuvers that exacerbate symptoms and avoid arthritis medication due to potential kidney effects. She Dr Sherrod gave him some pain medicine before. I asked her to get the bottle with the medication. She said all his medicine was stolen .  She was adamant that he needs something for pain because his neck hurts. I told  her I have no record of Anthony Sheppard prescribing pain medication and she needs to contact his PCP .

## 2024-08-24 ENCOUNTER — Telehealth: Payer: Self-pay

## 2024-08-24 ENCOUNTER — Telehealth: Payer: Self-pay | Admitting: Internal Medicine

## 2024-08-24 NOTE — Telephone Encounter (Signed)
 Spoke with Verneita regarding the patient's arthritic neck pain. Verneita inquired about a pain medication she believed was prescribed by Dr. Sherrod. Informed Verneita that our records do not indicate any pain medications prescribed by Dr. Sherrod.  Also informed Verneita that during the patient's ER visit on 08/21/24, the ER physician's note recommended follow-up with the PCP for any ongoing symptoms.  Consulted with Cassie, PA, who advised that the patient should follow up with their PCP, as the arthritic pain is not cancer-related. The PCP can evaluate the patient and determine whether referral to a specialist is appropriate for symptom management.  Verneita stated she will contact the PCP's office.

## 2024-08-31 ENCOUNTER — Other Ambulatory Visit: Payer: Self-pay

## 2024-08-31 DIAGNOSIS — C642 Malignant neoplasm of left kidney, except renal pelvis: Secondary | ICD-10-CM

## 2024-09-03 ENCOUNTER — Inpatient Hospital Stay: Attending: Oncology

## 2024-09-03 ENCOUNTER — Other Ambulatory Visit: Payer: Self-pay | Admitting: Medical Oncology

## 2024-09-03 ENCOUNTER — Ambulatory Visit

## 2024-09-03 ENCOUNTER — Inpatient Hospital Stay (HOSPITAL_BASED_OUTPATIENT_CLINIC_OR_DEPARTMENT_OTHER): Admitting: Internal Medicine

## 2024-09-03 VITALS — BP 97/54 | Temp 97.5°F | Resp 17 | Ht 61.0 in | Wt 122.3 lb

## 2024-09-03 DIAGNOSIS — R319 Hematuria, unspecified: Secondary | ICD-10-CM

## 2024-09-03 DIAGNOSIS — K649 Unspecified hemorrhoids: Secondary | ICD-10-CM | POA: Diagnosis not present

## 2024-09-03 DIAGNOSIS — C649 Malignant neoplasm of unspecified kidney, except renal pelvis: Secondary | ICD-10-CM | POA: Diagnosis not present

## 2024-09-03 DIAGNOSIS — C642 Malignant neoplasm of left kidney, except renal pelvis: Secondary | ICD-10-CM

## 2024-09-03 DIAGNOSIS — Z79899 Other long term (current) drug therapy: Secondary | ICD-10-CM | POA: Diagnosis not present

## 2024-09-03 LAB — CBC WITH DIFFERENTIAL (CANCER CENTER ONLY)
Abs Immature Granulocytes: 0.02 K/uL (ref 0.00–0.07)
Basophils Absolute: 0 K/uL (ref 0.0–0.1)
Basophils Relative: 0 %
Eosinophils Absolute: 0.2 K/uL (ref 0.0–0.5)
Eosinophils Relative: 3 %
HCT: 35.5 % — ABNORMAL LOW (ref 39.0–52.0)
Hemoglobin: 11.5 g/dL — ABNORMAL LOW (ref 13.0–17.0)
Immature Granulocytes: 0 %
Lymphocytes Relative: 14 %
Lymphs Abs: 0.7 K/uL (ref 0.7–4.0)
MCH: 30.4 pg (ref 26.0–34.0)
MCHC: 32.4 g/dL (ref 30.0–36.0)
MCV: 93.9 fL (ref 80.0–100.0)
Monocytes Absolute: 0.6 K/uL (ref 0.1–1.0)
Monocytes Relative: 11 %
Neutro Abs: 3.8 K/uL (ref 1.7–7.7)
Neutrophils Relative %: 72 %
Platelet Count: 218 K/uL (ref 150–400)
RBC: 3.78 MIL/uL — ABNORMAL LOW (ref 4.22–5.81)
RDW: 12.2 % (ref 11.5–15.5)
WBC Count: 5.4 K/uL (ref 4.0–10.5)
nRBC: 0 % (ref 0.0–0.2)

## 2024-09-03 LAB — CMP (CANCER CENTER ONLY)
ALT: 11 U/L (ref 0–44)
AST: 13 U/L — ABNORMAL LOW (ref 15–41)
Albumin: 3.7 g/dL (ref 3.5–5.0)
Alkaline Phosphatase: 77 U/L (ref 38–126)
Anion gap: 5 (ref 5–15)
BUN: 20 mg/dL (ref 8–23)
CO2: 26 mmol/L (ref 22–32)
Calcium: 8.7 mg/dL — ABNORMAL LOW (ref 8.9–10.3)
Chloride: 106 mmol/L (ref 98–111)
Creatinine: 0.8 mg/dL (ref 0.61–1.24)
GFR, Estimated: >60 mL/min (ref >60)
Glucose, Bld: 130 mg/dL — ABNORMAL HIGH (ref 70–99)
Potassium: 4.3 mmol/L (ref 3.5–5.1)
Sodium: 137 mmol/L (ref 135–145)
Total Bilirubin: 0.4 mg/dL (ref 0.0–1.2)
Total Protein: 6.9 g/dL (ref 6.5–8.1)

## 2024-09-03 LAB — TSH: TSH: 1.22 u[IU]/mL (ref 0.350–4.500)

## 2024-09-03 MED ORDER — HYDROCORTISONE ACETATE 25 MG RE SUPP: 25.0000 mg | Freq: Two times a day (BID) | RECTAL | 0 refills | Status: AC

## 2024-09-03 NOTE — Progress Notes (Signed)
 [facility] [phone]   [phone]  OFFICE PROGRESS NOTE  [name], MD [address] [place] [address]  DIAGNOSIS: Stage IV left intermediate risk clear-cell renal cell carcinoma diagnosed in September 2023 with abdominal adenopathy and pulmonary nodules.  PRIOR THERAPY:  1) Status post left kidney biopsy on August 30, 2022 consistent with renal cell carcinoma. 2) Induction treatment with immunotherapy with ipilimumab  1 Mg/KG and nivolumab  3 mg/KG every 3 weeks started on September 21, 2022 status post 4 cycles with partial response. 3) Maintenance treatment with nivolumab  480 Mg IV every 4 weeks first dose started December 14, 2022.  Status post 20 cycle of the maintenance therapy.   Last dose of treatment was given on July 24, 2024.  CURRENT THERAPY: Observation.  INTERVAL HISTORY: Anthony Sheppard 88 y.o. male returns to clinic today for follow-up visit accompanied by his god daughter Anthony Sheppard. Discussed the use of AI scribe software for clinical note transcription with the patient, who gave verbal consent to proceed.  History of Present Illness Anthony Sheppard is an 88 year old male with stage four clear cell renal cell carcinoma who presents following an emergency room visit for neck discomfort. He is accompanied by Anthony Sheppard, his goddaughter.  He has stage four clear cell renal cell carcinoma, diagnosed in September 2023. He underwent induction immunotherapy with iblumab and nivolumab  every three weeks for four cycles, achieving a partial response. He then transitioned to maintenance therapy with nivolumab  80 mg IV every four weeks for a total of 20 cycles. He is currently under observation.  He recently visited the emergency room due to neck discomfort. Imaging studies were performed, and he was told that he has arthritis. He describes the sensation as 'not pain' but rather a feeling similar to 'feet going to sleep' or 'something grinding on  it,' which he finds very uncomfortable. He has not experienced pain but reports discomfort when pressure is applied to the spine.  He reports noticing fresh blood in his stool. He has not used hemorrhoidal cream previously. No blood in urine.     MEDICAL HISTORY: Past Medical History:  Diagnosis Date   Cancer (HCC)    Diabetes mellitus    Enlarged prostate    Hypertension    Sleep apnea     ALLERGIES:  is allergic to aspirin.  MEDICATIONS:  Current Outpatient Medications  Medication Sig Dispense Refill   ADVIL  200 MG CAPS Take 400 mg by mouth every 6 (six) hours as needed (for mild pain or headaches).     ALEVE  220 MG tablet Take 220-440 mg by mouth 2 (two) times daily as needed (for mild pain or headaches).     amLODipine (NORVASC) 5 MG tablet Take 5 mg by mouth daily.     cetirizine  (ZYRTEC ) 10 MG tablet Take 10 mg by mouth at bedtime.     finasteride (PROSCAR) 5 MG tablet Take 5 mg by mouth at bedtime.     oxybutynin (DITROPAN) 5 MG tablet Take 5 mg by mouth daily.     tamsulosin (FLOMAX) 0.4 MG CAPS capsule Take 0.4 mg by mouth at bedtime.      No current facility-administered medications for this visit.    SURGICAL HISTORY: No past surgical history on file.  REVIEW OF SYSTEMS:  Constitutional: positive for fatigue Eyes: negative Ears, nose, mouth, throat, and face: negative Respiratory: negative Cardiovascular: negative Gastrointestinal: negative Genitourinary:negative Integument/breast: negative Hematologic/lymphatic: negative Musculoskeletal:positive for neck pain Neurological: negative Behavioral/Psych:  negative Endocrine: negative Allergic/Immunologic: negative   PHYSICAL EXAMINATION: General appearance: alert, cooperative, and no distress Head: Normocephalic, without obvious abnormality, atraumatic Neck: no adenopathy, no JVD, supple, symmetrical, trachea midline, and thyroid  not enlarged, symmetric, no tenderness/mass/nodules Lymph nodes: Cervical,  supraclavicular, and axillary nodes normal. Resp: clear to auscultation bilaterally Back: symmetric, no curvature. ROM normal. No CVA tenderness. Cardio: regular rate and rhythm, S1, S2 normal, no murmur, click, rub or gallop GI: soft, non-tender; bowel sounds normal; no masses,  no organomegaly Extremities: extremities normal, atraumatic, no cyanosis or edema Neurologic: Alert and oriented X 3, normal strength and tone. Normal symmetric reflexes. Normal coordination and gait  ECOG PERFORMANCE STATUS: 1 - Symptomatic but completely ambulatory  Blood pressure (!) 97/54, temperature (!) 97.5 F (36.4 C), resp. rate 17, height 5' 1 (1.549 m), weight 122 lb 4.8 oz (55.5 kg).  LABORATORY DATA: Lab Results  Component Value Date   WBC 5.4 09/03/2024   HGB 11.5 (L) 09/03/2024   HCT 35.5 (L) 09/03/2024   MCV 93.9 09/03/2024   PLT 218 09/03/2024      Chemistry      Component Value Date/Time   NA 137 07/24/2024 1149   K 4.2 07/24/2024 1149   CL 105 07/24/2024 1149   CO2 27 07/24/2024 1149   BUN 21 07/24/2024 1149   CREATININE 0.68 07/24/2024 1149      Component Value Date/Time   CALCIUM 8.6 (L) 07/24/2024 1149   ALKPHOS 88 07/24/2024 1149   AST 12 (L) 07/24/2024 1149   ALT 8 07/24/2024 1149   BILITOT 0.4 07/24/2024 1149       RADIOGRAPHIC STUDIES: No results found.    ASSESSMENT AND PLAN: This is a very pleasant 88 years old African-American male with a stage IV intermediate risk clear-cell carcinoma diagnosed in September 2023 status post 4 cycles of induction treatment with immunotherapy with ipilimumab  and nivolumab  and starting from cycle #5 he is currently on maintenance treatment with nivolumab  480 mg IV every 4 weeks status post total of 24 cycles.   The patient is currently on observation. Assessment and Plan Assessment & Plan Stage 4 clear cell renal cell carcinoma Stage 4 clear cell renal cell carcinoma diagnosed in September 2023. Status post induction  immunotherapy with iblumab and nivolumab  every three weeks for four cycles, resulting in partial response. Currently on observation after completing maintenance treatment with single-agent nivolumab  80 mg IV every four weeks for a total of 20 cycles.  Hemorrhoids with rectal bleeding Reports fresh blood in stool, likely due to hemorrhoids. No significant drop in hemoglobin observed. - Prescribe hemorrhoidal cream and send prescription to [facility] on [address].  Neck discomfort Experiencing neck discomfort with sensations of numbness and grinding, likely due to arthritis and possible nerve compression. Symptoms not related to cancer. - Consult family doctor for referral to a neurologist or orthopedic surgeon for neck evaluation. He was advised to call immediately if he has any concerning symptoms in the interval. The patient voices understanding of current disease status and treatment options and is in agreement with the current care plan.  All questions were answered. The patient knows to call the clinic with any problems, questions or concerns. We can certainly see the patient much sooner if necessary.  The total time spent in the appointment was 30 minutes.  Disclaimer: This note was dictated with voice recognition software. Similar sounding words can inadvertently be transcribed and may not be corrected upon review.

## 2024-09-04 LAB — T4: T4, Total: 5.4 ug/dL (ref 4.5–12.0)

## 2024-10-15 ENCOUNTER — Other Ambulatory Visit: Payer: Self-pay | Admitting: *Deleted

## 2024-10-15 DIAGNOSIS — C642 Malignant neoplasm of left kidney, except renal pelvis: Secondary | ICD-10-CM

## 2024-10-16 ENCOUNTER — Ambulatory Visit (HOSPITAL_COMMUNITY)
Admission: RE | Admit: 2024-10-16 | Discharge: 2024-10-16 | Disposition: A | Discharge status: Home / Self Care | Source: Ambulatory Visit | Attending: Internal Medicine | Admitting: Internal Medicine

## 2024-10-16 ENCOUNTER — Inpatient Hospital Stay: Attending: Internal Medicine

## 2024-10-16 DIAGNOSIS — Z79620 Long term (current) use of immunosuppressive biologic: Secondary | ICD-10-CM | POA: Diagnosis not present

## 2024-10-16 DIAGNOSIS — C649 Malignant neoplasm of unspecified kidney, except renal pelvis: Secondary | ICD-10-CM | POA: Insufficient documentation

## 2024-10-16 DIAGNOSIS — C642 Malignant neoplasm of left kidney, except renal pelvis: Secondary | ICD-10-CM | POA: Insufficient documentation

## 2024-10-16 LAB — CMP (CANCER CENTER ONLY)
ALT: 9 U/L (ref 0–44)
AST: 15 U/L (ref 15–41)
Albumin: 3.9 g/dL (ref 3.5–5.0)
Alkaline Phosphatase: 68 U/L (ref 38–126)
Anion gap: 6 (ref 5–15)
BUN: 18 mg/dL (ref 8–23)
CO2: 25 mmol/L (ref 22–32)
Calcium: 8.6 mg/dL — ABNORMAL LOW (ref 8.9–10.3)
Chloride: 106 mmol/L (ref 98–111)
Creatinine: 0.8 mg/dL (ref 0.61–1.24)
GFR, Estimated: >60 mL/min (ref >60)
Glucose, Bld: 103 mg/dL — ABNORMAL HIGH (ref 70–99)
Potassium: 4.5 mmol/L (ref 3.5–5.1)
Sodium: 137 mmol/L (ref 135–145)
Total Bilirubin: 0.6 mg/dL (ref 0.0–1.2)
Total Protein: 7.3 g/dL (ref 6.5–8.1)

## 2024-10-16 LAB — CBC WITH DIFFERENTIAL (CANCER CENTER ONLY)
Abs Immature Granulocytes: 0.02 K/uL (ref 0.00–0.07)
Basophils Absolute: 0 K/uL (ref 0.0–0.1)
Basophils Relative: 0 %
Eosinophils Absolute: 0.2 K/uL (ref 0.0–0.5)
Eosinophils Relative: 3 %
HCT: 39 % (ref 39.0–52.0)
Hemoglobin: 12.9 g/dL — ABNORMAL LOW (ref 13.0–17.0)
Immature Granulocytes: 0 %
Lymphocytes Relative: 11 %
Lymphs Abs: 0.6 K/uL — ABNORMAL LOW (ref 0.7–4.0)
MCH: 30.4 pg (ref 26.0–34.0)
MCHC: 33.1 g/dL (ref 30.0–36.0)
MCV: 92 fL (ref 80.0–100.0)
Monocytes Absolute: 0.6 K/uL (ref 0.1–1.0)
Monocytes Relative: 10 %
Neutro Abs: 4.3 K/uL (ref 1.7–7.7)
Neutrophils Relative %: 76 %
Platelet Count: 230 K/uL (ref 150–400)
RBC: 4.24 MIL/uL (ref 4.22–5.81)
RDW: 11.9 % (ref 11.5–15.5)
WBC Count: 5.8 K/uL (ref 4.0–10.5)
nRBC: 0 % (ref 0.0–0.2)

## 2024-10-16 LAB — TSH: TSH: 0.98 u[IU]/mL (ref 0.350–4.500)

## 2024-10-17 LAB — T4: T4, Total: 5.5 ug/dL (ref 4.5–12.0)

## 2024-10-23 ENCOUNTER — Inpatient Hospital Stay: Attending: Oncology | Admitting: Internal Medicine

## 2024-10-23 VITALS — BP 114/60 | HR 69 | Temp 98.0°F | Resp 17 | Ht 61.0 in | Wt 121.0 lb

## 2024-10-23 DIAGNOSIS — Z9221 Personal history of antineoplastic chemotherapy: Secondary | ICD-10-CM | POA: Diagnosis not present

## 2024-10-23 DIAGNOSIS — C642 Malignant neoplasm of left kidney, except renal pelvis: Secondary | ICD-10-CM | POA: Diagnosis not present

## 2024-10-23 DIAGNOSIS — M542 Cervicalgia: Secondary | ICD-10-CM | POA: Diagnosis not present

## 2024-10-23 DIAGNOSIS — Z8553 Personal history of malignant neoplasm of renal pelvis: Secondary | ICD-10-CM | POA: Diagnosis not present

## 2024-10-23 NOTE — Progress Notes (Signed)
 [facility] [phone]   [phone]  OFFICE PROGRESS NOTE  [name], MD [address] [place] [address]  DIAGNOSIS: Stage IV left intermediate risk clear-cell renal cell carcinoma diagnosed in September 2023 with abdominal adenopathy and pulmonary nodules.  PRIOR THERAPY:  1) Status post left kidney biopsy on August 30, 2022 consistent with renal cell carcinoma. 2) Induction treatment with immunotherapy with ipilimumab  1 Mg/KG and nivolumab  3 mg/KG every 3 weeks started on September 21, 2022 status post 4 cycles with partial response. 3) Maintenance treatment with nivolumab  480 Mg IV every 4 weeks first dose started December 14, 2022.  Status post 20 cycle of the maintenance therapy.   Last dose of treatment was given on July 24, 2024.  CURRENT THERAPY: Observation.  INTERVAL HISTORY: Anthony Sheppard 88 y.o. male returns to clinic today for follow-up visit accompanied by his god daughter Verneita. Discussed the use of AI scribe software for clinical note transcription with the patient, who gave verbal consent to proceed.  History of Present Illness Anthony Sheppard is an 87 year old male with stage four left clear cell renal cell carcinoma who presents for follow-up after immunotherapy treatment. He is accompanied by Verneita, his goddaughter.  He was diagnosed with stage four left clear cell renal cell carcinoma in September 2023. He underwent a biopsy of the left kidney followed by treatment with immunotherapy, specifically ipilimumab . He has been off treatment for the past three months and reports no recent weight loss, night sweats, chest pain, breathing issues, nausea, or vomiting.  He experiences ongoing neck pain, which is worsened by bending over, such as when he reads, and impacts his daily activities. The pain is exacerbated by bending over, such as when he reads, and impacts his daily activities.  He occasionally experiences stomach  pain, which he manages by chewing about six Pepto-Bismol tablets. There is no association with constipation or diarrhea.  Socially, he remains active and works at [employer] five days a week, sometimes more if allowed.    MEDICAL HISTORY: Past Medical History:  Diagnosis Date   Cancer (HCC)    Diabetes mellitus    Enlarged prostate    Hypertension    Sleep apnea     ALLERGIES:  is allergic to aspirin.  MEDICATIONS:  Current Outpatient Medications  Medication Sig Dispense Refill   ADVIL  200 MG CAPS Take 400 mg by mouth every 6 (six) hours as needed (for mild pain or headaches).     ALEVE  220 MG tablet Take 220-440 mg by mouth 2 (two) times daily as needed (for mild pain or headaches).     amLODipine (NORVASC) 5 MG tablet Take 5 mg by mouth daily.     cetirizine  (ZYRTEC ) 10 MG tablet Take 10 mg by mouth at bedtime.     finasteride (PROSCAR) 5 MG tablet Take 5 mg by mouth at bedtime.     hydrocortisone  (ANUSOL -HC) 25 MG suppository Place 1 suppository (25 mg total) rectally 2 (two) times daily. 12 suppository 0   oxybutynin (DITROPAN) 5 MG tablet Take 5 mg by mouth daily.     tamsulosin (FLOMAX) 0.4 MG CAPS capsule Take 0.4 mg by mouth at bedtime.      No current facility-administered medications for this visit.    SURGICAL HISTORY: No past surgical history on file.  REVIEW OF SYSTEMS:  Constitutional: negative Eyes: negative Ears, nose, mouth, throat, and face: negative Respiratory: negative Cardiovascular: negative Gastrointestinal: negative Genitourinary:negative Integument/breast:  negative Hematologic/lymphatic: negative Musculoskeletal:positive for neck pain Neurological: negative Behavioral/Psych: negative Endocrine: negative Allergic/Immunologic: negative   PHYSICAL EXAMINATION: General appearance: alert, cooperative, and no distress Head: Normocephalic, without obvious abnormality, atraumatic Neck: no adenopathy, no JVD, supple, symmetrical, trachea midline, and  thyroid  not enlarged, symmetric, no tenderness/mass/nodules Lymph nodes: Cervical, supraclavicular, and axillary nodes normal. Resp: clear to auscultation bilaterally Back: symmetric, no curvature. ROM normal. No CVA tenderness. Cardio: regular rate and rhythm, S1, S2 normal, no murmur, click, rub or gallop GI: soft, non-tender; bowel sounds normal; no masses,  no organomegaly Extremities: extremities normal, atraumatic, no cyanosis or edema Neurologic: Alert and oriented X 3, normal strength and tone. Normal symmetric reflexes. Normal coordination and gait  ECOG PERFORMANCE STATUS: 1 - Symptomatic but completely ambulatory  Blood pressure 114/60, pulse 69, temperature 98 F (36.7 C), temperature source Temporal, resp. rate 17, height 5' 1 (1.549 m), weight 121 lb (54.9 kg), SpO2 95%.  LABORATORY DATA: Lab Results  Component Value Date   WBC 5.8 10/16/2024   HGB 12.9 (L) 10/16/2024   HCT 39.0 10/16/2024   MCV 92.0 10/16/2024   PLT 230 10/16/2024      Chemistry      Component Value Date/Time   NA 137 10/16/2024 1119   K 4.5 10/16/2024 1119   CL 106 10/16/2024 1119   CO2 25 10/16/2024 1119   BUN 18 10/16/2024 1119   CREATININE 0.80 10/16/2024 1119      Component Value Date/Time   CALCIUM 8.6 (L) 10/16/2024 1119   ALKPHOS 68 10/16/2024 1119   AST 15 10/16/2024 1119   ALT 9 10/16/2024 1119   BILITOT 0.6 10/16/2024 1119       RADIOGRAPHIC STUDIES: CT CHEST ABDOMEN PELVIS WO CONTRAST Result Date: 10/18/2024 CLINICAL DATA:  Renal cell carcinoma restaging * Tracking Code: BO * EXAM: CT CHEST, ABDOMEN AND PELVIS WITHOUT CONTRAST TECHNIQUE: Multidetector CT imaging of the chest, abdomen and pelvis was performed following the standard protocol without IV contrast. RADIATION DOSE REDUCTION: This exam was performed according to the departmental dose-optimization program which includes automated exposure control, adjustment of the mA and/or kV according to patient size and/or use of  iterative reconstruction technique. COMPARISON:  07/17/2024 FINDINGS: CT CHEST FINDINGS Cardiovascular: Scattered aortic atherosclerosis. Normal heart size. Left and right coronary artery calcifications. No pericardial effusion. Mediastinum/Nodes: No enlarged mediastinal, hilar, or axillary lymph nodes. Heterogeneous multinodular thyroid  (series 2, image 6). Trachea, and esophagus demonstrate no significant findings. Lungs/Pleura: Unchanged 0.7 cm peribronchovascular nodule in the right upper lobe (series 4, image 45). No pleural effusion or pneumothorax. Musculoskeletal: No chest wall abnormality. No acute osseous findings. CT ABDOMEN PELVIS FINDINGS Hepatobiliary: No solid liver abnormality is seen. Multiple low-attenuation liver lesions again seen, the largest of which are simple fluid attenuation cysts, others too small to confidently characterize although most likely additional cysts. Contracted gallbladder. No gallstones, gallbladder wall thickening, or biliary dilatation. Pancreas: Unremarkable. No pancreatic ductal dilatation or surrounding inflammatory changes. Spleen: Normal in size without significant abnormality. Adrenals/Urinary Tract: Adrenal glands are unremarkable. Unchanged noncontrast appearance of left renal mass (series two, image 48). Right kidney is normal in noncontrast appearance, without renal calculi, obvious solid lesion, or hydronephrosis. Bladder is unremarkable. Stomach/Bowel: Stomach is within normal limits. Appendix appears normal. No evidence of bowel wall thickening, distention, or inflammatory changes. Moderate burden of stool throughout the colon and rectum. Vascular/Lymphatic: Scattered aortic atherosclerosis. No enlarged abdominal or pelvic lymph nodes. Reproductive: Severe prostatomegaly. Other: Small fat containing right inguinal hernia.  No  ascites. Musculoskeletal: No acute osseous findings. IMPRESSION: 1. Unchanged noncontrast appearance of left renal mass. 2. Unchanged 0.7  cm peribronchovascular nodule in the right upper lobe. Continued attention on follow-up. 3. No noncontrast evidence of lymphadenopathy or metastatic disease in the chest, abdomen, or pelvis. 4. Severe prostatomegaly. 5. Coronary artery disease. Aortic Atherosclerosis (ICD10-I70.0). Electronically Signed   By: Marolyn JONETTA Jaksch M.D.   On: 10/18/2024 11:06      ASSESSMENT AND PLAN: This is a very pleasant 88 years old African-American male with a stage IV intermediate risk clear-cell carcinoma diagnosed in September 2023 status post 4 cycles of induction treatment with immunotherapy with ipilimumab  and nivolumab  and starting from cycle #5 he is currently on maintenance treatment with nivolumab  480 mg IV every 4 weeks status post total of 24 cycles.   The patient is currently on observation. He had repeat CT scan of the chest, abdomen and pelvis performed recently.  I personally independently reviewed the scan and discussed the result with the patient today.  His scan showed no concerning findings for disease progression. Assessment and Plan Assessment & Plan Stage IV left clear cell renal cell carcinoma Diagnosed in September 2023. Post biopsy of the left kidney followed by treatment with ipilimumab . Recent scans of the chest, abdomen, and pelvis show no evidence of disease progression or metastasis. No recent weight loss, night sweats, chest pain, or respiratory issues reported. Occasional stomach discomfort managed with Pepto-Bismol. - Continue surveillance with scans every three months to monitor for disease progression. He was advised to call immediately if he has any concerning symptoms in the interval.  The patient voices understanding of current disease status and treatment options and is in agreement with the current care plan.  All questions were answered. The patient knows to call the clinic with any problems, questions or concerns. We can certainly see the patient much sooner if  necessary.  The total time spent in the appointment was 30 minutes.  Disclaimer: This note was dictated with voice recognition software. Similar sounding words can inadvertently be transcribed and may not be corrected upon review.

## 2024-10-25 ENCOUNTER — Telehealth: Payer: Self-pay | Admitting: Internal Medicine

## 2024-10-25 NOTE — Telephone Encounter (Signed)
 Scheduled patient for next appointments. Called and spoke with him, he is aware.

## 2025-01-11 ENCOUNTER — Telehealth: Payer: Self-pay | Admitting: Internal Medicine

## 2025-01-11 NOTE — Telephone Encounter (Signed)
 Pt didn't answer abt cancellation

## 2025-01-14 ENCOUNTER — Ambulatory Visit (HOSPITAL_COMMUNITY)

## 2025-01-14 ENCOUNTER — Inpatient Hospital Stay

## 2025-01-15 ENCOUNTER — Inpatient Hospital Stay

## 2025-01-15 ENCOUNTER — Ambulatory Visit (HOSPITAL_COMMUNITY)

## 2025-01-21 ENCOUNTER — Inpatient Hospital Stay: Admitting: Internal Medicine

## 2025-01-23 ENCOUNTER — Inpatient Hospital Stay: Admitting: Internal Medicine

## 2025-01-24 ENCOUNTER — Ambulatory Visit (HOSPITAL_COMMUNITY)
Admission: RE | Admit: 2025-01-24 | Discharge: 2025-01-24 | Disposition: A | Source: Ambulatory Visit | Attending: Internal Medicine | Admitting: Internal Medicine

## 2025-01-24 DIAGNOSIS — C642 Malignant neoplasm of left kidney, except renal pelvis: Secondary | ICD-10-CM

## 2025-02-04 ENCOUNTER — Inpatient Hospital Stay: Admitting: Internal Medicine
